# Patient Record
Sex: Female | Born: 1935 | Race: Black or African American | Hispanic: No | Smoking: Never smoker
Health system: Southern US, Community
[De-identification: ages and names within clinical notes are randomized; demographics above are authoritative.]

## PROBLEM LIST (undated history)

## (undated) DIAGNOSIS — I482 Chronic atrial fibrillation, unspecified: Secondary | ICD-10-CM

## (undated) DIAGNOSIS — I5189 Other ill-defined heart diseases: Secondary | ICD-10-CM

## (undated) DIAGNOSIS — A0472 Enterocolitis due to Clostridium difficile, not specified as recurrent: Secondary | ICD-10-CM

## (undated) DIAGNOSIS — I10 Essential (primary) hypertension: Secondary | ICD-10-CM

## (undated) DIAGNOSIS — M109 Gout, unspecified: Secondary | ICD-10-CM

## (undated) DIAGNOSIS — K219 Gastro-esophageal reflux disease without esophagitis: Secondary | ICD-10-CM

## (undated) DIAGNOSIS — B029 Zoster without complications: Secondary | ICD-10-CM

## (undated) DIAGNOSIS — I2510 Atherosclerotic heart disease of native coronary artery without angina pectoris: Secondary | ICD-10-CM

## (undated) DIAGNOSIS — D259 Leiomyoma of uterus, unspecified: Secondary | ICD-10-CM

## (undated) DIAGNOSIS — N182 Chronic kidney disease, stage 2 (mild): Secondary | ICD-10-CM

## (undated) DIAGNOSIS — N289 Disorder of kidney and ureter, unspecified: Secondary | ICD-10-CM

## (undated) DIAGNOSIS — E039 Hypothyroidism, unspecified: Secondary | ICD-10-CM

## (undated) DIAGNOSIS — K5792 Diverticulitis of intestine, part unspecified, without perforation or abscess without bleeding: Secondary | ICD-10-CM

## (undated) DIAGNOSIS — K449 Diaphragmatic hernia without obstruction or gangrene: Secondary | ICD-10-CM

## (undated) DIAGNOSIS — I701 Atherosclerosis of renal artery: Secondary | ICD-10-CM

## (undated) HISTORY — PX: CARDIAC CATHETERIZATION: SHX172

## (undated) HISTORY — PX: RENAL ARTERY STENT: SHX2321

---

## 1999-11-13 ENCOUNTER — Encounter: Payer: Self-pay | Admitting: Cardiology

## 1999-11-13 ENCOUNTER — Ambulatory Visit (HOSPITAL_COMMUNITY): Admission: RE | Admit: 1999-11-13 | Discharge: 1999-11-13 | Payer: Self-pay | Admitting: Cardiology

## 1999-12-26 ENCOUNTER — Encounter: Payer: Self-pay | Admitting: Pulmonary Disease

## 1999-12-26 ENCOUNTER — Inpatient Hospital Stay (HOSPITAL_COMMUNITY): Admission: EM | Admit: 1999-12-26 | Discharge: 1999-12-31 | Payer: Self-pay | Admitting: Pulmonary Disease

## 1999-12-27 ENCOUNTER — Encounter: Payer: Self-pay | Admitting: Pulmonary Disease

## 2000-01-13 ENCOUNTER — Encounter: Payer: Self-pay | Admitting: *Deleted

## 2000-01-13 ENCOUNTER — Ambulatory Visit (HOSPITAL_COMMUNITY): Admission: RE | Admit: 2000-01-13 | Discharge: 2000-01-13 | Payer: Self-pay | Admitting: *Deleted

## 2001-10-18 ENCOUNTER — Encounter: Payer: Self-pay | Admitting: Ophthalmology

## 2001-10-18 ENCOUNTER — Ambulatory Visit (HOSPITAL_COMMUNITY): Admission: RE | Admit: 2001-10-18 | Discharge: 2001-10-18 | Payer: Self-pay | Admitting: Ophthalmology

## 2003-06-14 ENCOUNTER — Inpatient Hospital Stay (HOSPITAL_COMMUNITY): Admission: EM | Admit: 2003-06-14 | Discharge: 2003-06-18 | Payer: Self-pay | Admitting: Emergency Medicine

## 2003-06-14 ENCOUNTER — Encounter: Payer: Self-pay | Admitting: Emergency Medicine

## 2003-06-15 ENCOUNTER — Encounter: Payer: Self-pay | Admitting: Pulmonary Disease

## 2003-06-16 ENCOUNTER — Encounter (INDEPENDENT_AMBULATORY_CARE_PROVIDER_SITE_OTHER): Payer: Self-pay | Admitting: *Deleted

## 2003-11-02 ENCOUNTER — Emergency Department (HOSPITAL_COMMUNITY): Admission: EM | Admit: 2003-11-02 | Discharge: 2003-11-02 | Payer: Self-pay | Admitting: Emergency Medicine

## 2004-02-12 ENCOUNTER — Encounter: Admission: RE | Admit: 2004-02-12 | Discharge: 2004-02-12 | Payer: Self-pay | Admitting: Pulmonary Disease

## 2004-03-17 ENCOUNTER — Emergency Department (HOSPITAL_COMMUNITY): Admission: EM | Admit: 2004-03-17 | Discharge: 2004-03-17 | Payer: Self-pay | Admitting: Emergency Medicine

## 2004-03-17 ENCOUNTER — Inpatient Hospital Stay (HOSPITAL_COMMUNITY): Admission: AD | Admit: 2004-03-17 | Discharge: 2004-03-22 | Payer: Self-pay | Admitting: Pulmonary Disease

## 2004-10-22 ENCOUNTER — Ambulatory Visit (HOSPITAL_COMMUNITY): Admission: RE | Admit: 2004-10-22 | Discharge: 2004-10-22 | Payer: Self-pay | Admitting: Pulmonary Disease

## 2004-11-26 ENCOUNTER — Ambulatory Visit (HOSPITAL_COMMUNITY): Admission: RE | Admit: 2004-11-26 | Discharge: 2004-11-26 | Payer: Self-pay | Admitting: Pulmonary Disease

## 2005-09-05 ENCOUNTER — Emergency Department (HOSPITAL_COMMUNITY): Admission: EM | Admit: 2005-09-05 | Discharge: 2005-09-06 | Payer: Self-pay | Admitting: Emergency Medicine

## 2006-11-23 ENCOUNTER — Emergency Department (HOSPITAL_COMMUNITY): Admission: EM | Admit: 2006-11-23 | Discharge: 2006-11-23 | Payer: Self-pay | Admitting: Emergency Medicine

## 2006-12-11 ENCOUNTER — Ambulatory Visit (HOSPITAL_COMMUNITY): Admission: RE | Admit: 2006-12-11 | Discharge: 2006-12-11 | Payer: Self-pay | Admitting: Pulmonary Disease

## 2006-12-11 ENCOUNTER — Ambulatory Visit: Payer: Self-pay | Admitting: Vascular Surgery

## 2006-12-11 ENCOUNTER — Encounter: Payer: Self-pay | Admitting: Vascular Surgery

## 2009-04-18 ENCOUNTER — Ambulatory Visit (HOSPITAL_COMMUNITY): Admission: RE | Admit: 2009-04-18 | Discharge: 2009-04-18 | Payer: Self-pay | Admitting: Pulmonary Disease

## 2009-10-29 ENCOUNTER — Ambulatory Visit (HOSPITAL_COMMUNITY): Admission: RE | Admit: 2009-10-29 | Discharge: 2009-10-29 | Payer: Self-pay | Admitting: Pulmonary Disease

## 2009-12-06 ENCOUNTER — Ambulatory Visit (HOSPITAL_COMMUNITY): Admission: RE | Admit: 2009-12-06 | Discharge: 2009-12-06 | Payer: Self-pay | Admitting: Pulmonary Disease

## 2009-12-18 ENCOUNTER — Inpatient Hospital Stay (HOSPITAL_BASED_OUTPATIENT_CLINIC_OR_DEPARTMENT_OTHER): Admission: RE | Admit: 2009-12-18 | Discharge: 2009-12-18 | Payer: Self-pay | Admitting: Cardiology

## 2010-08-12 ENCOUNTER — Emergency Department (HOSPITAL_COMMUNITY)
Admission: EM | Admit: 2010-08-12 | Discharge: 2010-08-12 | Payer: Self-pay | Source: Home / Self Care | Admitting: Emergency Medicine

## 2010-12-01 LAB — CBC
HCT: 39.9 % (ref 36.0–46.0)
MCV: 92 fL (ref 78.0–100.0)
Platelets: 203 10*3/uL (ref 150–400)
RDW: 14 % (ref 11.5–15.5)
WBC: 11 10*3/uL — ABNORMAL HIGH (ref 4.0–10.5)

## 2010-12-01 LAB — BASIC METABOLIC PANEL
BUN: 17 mg/dL (ref 6–23)
CO2: 24 mEq/L (ref 19–32)
Chloride: 102 mEq/L (ref 96–112)
Glucose, Bld: 113 mg/dL — ABNORMAL HIGH (ref 70–99)
Potassium: 3.3 mEq/L — ABNORMAL LOW (ref 3.5–5.1)

## 2010-12-01 LAB — GLUCOSE, CAPILLARY: Glucose-Capillary: 254 mg/dL — ABNORMAL HIGH (ref 70–99)

## 2011-01-24 NOTE — Discharge Summary (Signed)
NAMECHEROKEE, BOCCIO NO.:  [id number]   MEDICAL RECORD NO.:  [id number]          PATIENT TYPE:  INP   LOCATION:  2020                         FACILITY:  [facility]   PHYSICIAN:  Eino Farber., M.D.DATE OF BIRTH:  03/01/1932   DATE OF ADMISSION:  03/17/2004  DATE OF DISCHARGE:  03/22/2004                                 DISCHARGE SUMMARY   DISCHARGE DIAGNOSES:  1.  Hypovolemia with dehydration.  2.  Rhabdomyolysis probably due to dehydration and excessive heat.  3.  Diabetes mellitus, type 2, uncontrolled, improved.  4.  Cardiac dysrhythmia with supraventricular bradycardia converted to sinus      rhythm.  5.  Azotemia, mild.  6.  Hypertensive heart disease history.  7.  Hypercholesterolism on Lipitor, question contributing to muscle injury.  8.  Gastrointestinal reflux disease.  9.  Atherosclerotic heart disease history with episodes of angina, mild.   BRIEF HISTORY:  The patient is a 77 year old black female with a history of  multiple medical problems with a long history of hypertensive heart disease,  but developed hypotension and was found at the [facility]  Emergency Department to have supraventricular bradycardia and was admitted  for further care.  The patient also has a history of atherosclerotic heart  disease and was found following a syncopal episode on March 17, 2004, while  being evaluated in the emergency department she was found to be hypotensive.  The patient also has a history of recent onset of diabetes mellitus, type 2.  The syncopal episode is thought to be related to the supraventricular  bradyarrhythmia.  The family relates that she had kept her house severely  hot with her air conditioning being turned of this summer.  The patient was  found on the floor at home and notified a relative, who brought her to the  emergency department by emergency medical services.  The patient also has a  past history of  gastrointestinal reflux disease.  She has a several-year  history of mild to moderate Alzheimer's.   SOCIAL HISTORY:  The patient denied a history of smoking or alcohol abuse.   REVIEW OF SYSTEMS:  The head, eyes, ears, nose, and throat, cardiopulmonary,  GI and neuromuscular systems as noted.  She has a long history of  hypercholesterolism and also had developed an acute episode of coughing  which continued in the emergency department.  The symptoms of cough  continued during her period of evaluation on admission.   ALLERGIES:  The patient has no known drug allergies.   PERTINENT PHYSICAL FINDINGS:  GENERAL APPEARANCE:  The patient is a well-  developed, well-nourished, black female who was oriented x 3 at the time of  my examination initially, as well as on March 18, 2004, but was unable to  give details about her condition.  VITAL SIGNS:  Vital signs in the emergency department revealed a blood  pressure of 83/51 and a pulse of 42, but after IV fluids and admission to  the floor on the 2000 unit with telemetry, her temperature was 96.6 degrees,  pulse 48, respirations  24 and blood pressure up to 98/56.  HEENT:  Examination revealed the patient to be alert and oriented x 3, but  slow to respond to questions.  NECK:  Supple with no masses.  There was no evidence of thyroid enlargement.  BREASTS:  No masses.  LUNGS:  Clear to auscultation and percussion, except for minimal rhonchi  with cough.  EXTREMITIES:  Good strength with movement bilaterally.  The dorsalis pedis  pulses were present, but weak.   LABORATORY DATA:  The patient's cardiac enzymes revealed a CK, CK-MB and  troponin I to be elevated, but the CK was markedly elevated more pronounced  consistent with musculoskeletal injury with the CK elevated to 4347.  The  renal function revealed mild increase in the BUN to 37 mg% with a serum  creatinine of 2.5 mg%.   HOSPITAL COURSE:  The patient's hospital course was one of  gradual  improvement.  Her CK slowly improved during her hospital course and on March 19, 2004, the CK was improved, but still markedly elevated to 3319.  By March 21, 2004, the CK had continued to improve with a value of 825 units/L with  the CK-MB in the normal range.  The patient had adjustments in her diabetic  therapy in that she had an episode of hypoglycemia with a capillary blood  glucose of 50 mg% on March 20, 2004.   DISCHARGE MEDICATIONS:  1.  Actos 15 mg tablet with dinner meal.  2.  Norvasc 5 mg tablet one daily.  3.  Accupril/hydrochlorothiazide 20/12.5 mg tablet daily.  4.  Toprol XL 25 mg tablet one daily in that her blood pressure on March 21, 2004, increased to 136/74 and on March 22, 2004, her blood pressure had      increased to 161/95, requiring the restart of her antihypertensive      medication.  5.  Restart Lipitor 20 mg tablet one daily.  6.  Calcium citrate 600 mg tablet one b.i.d.  7.  K-Dur 20 mEq tablet one daily.  8.  Protonix 40 mg tablet one daily.  9.  Chronulac 30 mL q.h.s. for stool softener.  10. Estazolam (ProSom) 2 mg at h.s. p.r.n. sleep.  11. Nortriptyline 50 mg tablet one q.h.s. for rest and pain.  12. Darvocet-N 100 one q.6h. p.r.n. pain.   ACTIVITY:  The patient was instructed to ambulate as tolerated.   DISCHARGE DIET:  No concentrated sweets, low-fat and low-cholesterol  diabetic diet.   Her lipid profile was satisfactory on her statin therapy with a total  cholesterol of 142 mg%, HDL cholesterol 48 mg% and an LDL cholesterol of 77  mg%.   FOLLOWUP:  The patient was to have a followup in the office on April 16, 2004.   SPECIAL INSTRUCTIONS:  She was to avoid extreme heat with her temperature in  the home not being allowed to go over 85 degrees and to use her air  conditioning.       GRK/MEDQ  D:  06/17/2004  T:  06/17/2004  Job:  [id number]

## 2011-01-24 NOTE — Discharge Summary (Signed)
[name], [name] NO.:  [id number]   MEDICAL RECORD NO.:  [id number]                   PATIENT TYPE:  INP   LOCATION:  4733                                 FACILITY:  [facility]   PHYSICIAN:  Mina Marble, M.D.          DATE OF BIRTH:  03/01/1932   DATE OF ADMISSION:  06/14/2003  DATE OF DISCHARGE:  06/18/2003                                 DISCHARGE SUMMARY   PROCEDURES:  On June 16, 2003, colonoscopy.   ADMISSION DIAGNOSES:  1. Acute abdominal distention, rule out partial colon obstruction.  2. Urinary tract infection.  3. Hypertensive heart disease.  4. Type 2 diabetes mellitus, new onset.  5. Degenerative joint disease of extremities, moderate.  6. Gastrointestinal reflux disease clinically.  7. History of hypercholesterolemia.   DISCHARGE DIAGNOSES:  1. Acute abdominal distention, improved.  2. Acute diverticulitis, biopsy pending.  3. Type 2 diabetes mellitus, new onset.  4. Urinary tract infection, Escherichia coli.  5. Hypercholesterolemia.  6. Hypertensive heart disease.  7. Degenerative joint disease of lower extremities.  8. Gastrointestinal reflux disease.  9. Atherosclerotic heart disease with angina, controlled.  10.      Constipation, improved.   REASON FOR HOSPITALIZATION:  The patient is a 77 year old black female with  known history of hypertensive heart disease and gastrointestinal reflux  disease clinically, who was admitted from the ED of [facility] with complaints of acute onset of low mid abdominal pain with  abdominal distention with a large amount of gas and stool in colon of  unclear etiology and question of colon lesions with partial obstruction.  She was admitted for further evaluation and treatment.   ALLERGIES:  No known drug allergies.   LABORATORY STUDIES:  On June 14, 2003, EKG showed tachycardia and possible  anterolateral infarction.  CT of the abdomen and pelvis on  June 14, 2003,  showed a large amount of fecal material within the left colon and splenic  flexure, gaseous distention of transverse and right colon, no definite  obstructing mass seen, and no evidence of diverticulosis or diverticulitis  identified.  CT of the pelvis showed a small amount of free fluid in the  pelvis, uterine fibroids, and no definite colon masses identified, but there  was decompression of the rectum and lower sigmoid colon with distention of  the upper sigmoid colon, left colon, transverse colon, and right colon.  On  June 14, 2003, the CBC showed WBC 17.4, hemoglobin 13, hematocrit 38.5,  and platelets 253.  On June 18, 2003, the WBC was 9.1, hemoglobin 10.7,  hematocrit 32.1, and platelets 240.  On June 14, 2003, chemistries were  within normal limits, except for a glucose of 171, calcium 8.2, albumin 2.7,  and total bilirubin 0.2.  The hemoglobin A1C on June 15, 2003, was 7.9.  Cardiac enzymes:  The CK was 83, the CK-MB was 1.6, and the troponin I  was  0.02.  On June 15, 2003, the lipid profile showed cholesterol 144,  triglycerides 122, HDL 41, and LDL 79.  On June 14, 2003, the urine  culture showed Escherichia coli.   HOSPITAL COURSE:  The patient was admitted to a telemetry bed.  IV therapy  was initiated.  The patient was started on Zosyn 3.75 g IV q.8h.  Vicodin  one q.4h. was given p.r.n. for pain.  The patient was started on a clear  liquid diet.  Also added to the plan of care was Lactulose 30 mL q.a.m. and  h.s. and Protonix 40 mg p.o. daily.  Because the patient was a new diabetic,  the patient was started on 1/2 normal saline with 20 mEq of K at 100 mL/hr  and started on an insulin-sensitive scale.  A KUB was performed.  Also, a  gastroenterology consult was done by Georgiana Spinner, M.D.  He performed a  colonoscopy on June 16, 2003.  It showed a questionable diverticulitis.  He also started the patient on MiraLax.  Dr. [name] Rochester thought that it  was  probably diverticulitis.  For better control of the patient's diabetes, she  was started on Actos 15 mg tablet daily with dinner meal and Amaryl 2 mg  tablet a.c. breakfast.  He also added to her care with Toprol XL 25 mg  daily.  The patient was changed to a low-residue diet.  Also for her  hypertension, Norvasc 5 mg was changed to 15 mg p.o. daily.  The patient was  given flu vaccination before discharge.   DISCHARGE CONDITION:  The patient was discharged home on stable condition  with the patient feeling better with less abdominal pain and able to  tolerate p.o. intake.  Her temperature was 98 degrees.   DISCHARGE MEDICATIONS:  1. Augmentin 500 mg tablet one b.i.d. for five more days.  2. Lactulose 30 mL q.a.m. and q.h.s.  3. MiraLax __________  4. Tiazac 40 mg tablet one daily.  5. Zestoretic 20/12.5 mg tablet one daily.  6. Toprol XL 25 mg tablet one daily.  7. Actos 15 mg tablet one daily with dinner meal.  8. Amaryl 1 mg tablet one daily a.c. breakfast.  9. Protonix 40 mg tablet one daily.  10.      Trental 40 mg tablet one daily.  11.      Continue with Lipitor 20 mg tablet one daily as ordered.   ACTIVITY:  Ambulate as tolerated.   DIET:  200 calorie ADA diet, low-fat, low-cholesterol, no added salt diet.    SPECIAL INSTRUCTIONS:  Diabetic education at [facility].  Check  with the office for samples if needed.   FOLLOWUP:  Call the office for followup time.      Dionne D. Retta Mac, M.D.    DDR/MEDQ  D:  07/09/2003  T:  07/09/2003  Job:  [id number]   cc:   Georgiana Spinner, M.D.  [address]  [place]  [place] [address]  Fax: [phone]

## 2011-01-24 NOTE — Op Note (Signed)
   NAME:  [name] [name], [name] [name] NO.:  [id number]   MEDICAL RECORD NO.:  [id number]                   PATIENT TYPE:  INP   LOCATION:  4733                                 FACILITY:  [facility]   PHYSICIAN:  Georgiana Spinner, M.D.                 DATE OF BIRTH:  03/01/1932   DATE OF PROCEDURE:  06/16/2003  DATE OF DISCHARGE:                                 OPERATIVE REPORT   PROCEDURE:  Colonoscopy with biopsy.   INDICATIONS:  Abdominal pain, increasing constipation.   ANESTHESIA:  Demerol 140 mg, Versed 14 mg.   DESCRIPTION OF PROCEDURE:  With the patient mildly sedated in the left  lateral decubitus position, subsequently rolled to her back and also onto  her right side, the Olympus videoscopic colonoscope was inserted into the  rectum and passed under direct vision with pressure applied, the patient  turned to various positions.  We subsequently were able to get through a  very spastic colon and we finally reached the cecum, identified by ileocecal  valve and crow's foot of the cecum, which were photographed.  From this  point the colonoscope was slowly withdrawn, taking circumferential views of  colonic mucosa, stopping in what appeared to be the descending-sigmoid colon  area, at which point ulceration was seen.  We never saw diverticulosis per  se but the mucosa was somewhat erythematous.  I saw no mass lesion, but  there was a very large ulcer that was photographed and biopsied.  The  surrounding mucosa was somewhat erythematous and just circumferential for a  short distance around the ulcer, the mucosa was also somewhat erythematous  but smooth.  After biopsies were taken, the endoscope was then withdrawn all  the way to the rectum, which appeared normal on direct and on retroflexed  view was difficult to see because the patient was not able to hold her  insufflated air throughout the procedure, but no gross lesions seen.  The  endoscope was straightened and  withdrawn.  The patient's vital signs and  pulse oximetry remained stable.  The patient tolerated the procedure well  without apparent complications.   FINDINGS:  Changes in sigmoid colon/descending colon as noted above.  Presumptively this is diverticulitis, but we will await biopsy report.  Will  continue the patient on antibiotics.                                               Georgiana Spinner, M.D.    GMO/MEDQ  D:  06/16/2003  T:  06/17/2003  Job:  [id number]   cc:   Eino Farber., M.D.  [address] [place]  [place] [address]  Fax: [phone]

## 2011-01-24 NOTE — Consult Note (Signed)
NAME:  MOLLEY, HOUSER NO.:  [id number]   MEDICAL RECORD NO.:  [id number]                   PATIENT TYPE:  INP   LOCATION:  4733                                 FACILITY:  [facility]   PHYSICIAN:  Georgiana Spinner, M.D.                 DATE OF BIRTH:  03/01/1932   DATE OF CONSULTATION:  06/15/2003  DATE OF DISCHARGE:                                   CONSULTATION   Ms. Wass is a very lovely 77 year old lady who I have been asked to see  because of abdominal discomfort and colonic distention.  She states that she  has had problems with constipation for the last couple of years, but that  had always been responsive to lactulose; but, over the past few weeks she  has noticed less responsiveness to the lactulose and more constipation, to  the point where she states that the lactulose will not work to cause a bowel  movement and she has noticed some abdominal discomfort and distention.  No  fever or chills.  She was admitted by Dr. Petra Kuba and a CT scan showed  stool in the left colon and transverse colon with a possible obstruction of  the distal colon, but no evidence of diverticulosis was noted.  Certainly no  diverticulitis.  The patient denies any melena or hematochezia but was noted  to have a white count of 17,000 with a hematocrit of 38.5 on admission.  The  patient does not smoke or drink.  She had been taking care of children in  her home after leaving [employer], but currently she is retired.   FAMILY HISTORY:  Family history is negative for colon polyps and colon  cancer.  She has had significant reflux.  She also has had a history of  hypertension, hypercholesterolemia, diabetes type 2 and degenerative  arthritis.   PHYSICAL EXAMINATION:  GENERAL:  On examination now she is a pleasant  female. She appears to be alert, oriented and responsive.  HEENT:  Unremarkable at this time.  NECK:  Thyroid is not enlarged.  No bruits are heard  in the neck.  CHEST:  Lungs are clear.  CARDIOVASCULAR:  Heart is regular rhythm without murmur, gallop, or rub.  ABDOMEN:  Benign except for tenderness in the left lower quadrant.   IMPRESSION:  Certainly it seems that she could have diverticulitis with an  elevated white count, tenderness in the left lower quadrant with abdominal  pain in the left lower quadrant and change in bowel habits, but given the  fact that her CT scan showed no changes of diverticulitis and even no  diverticula were seen, it certainly gives concern for possible malignancy in  the sigmoid colon area. Currently her temperature is 98.3, blood pressure is  154/95,  respiratory rate 20 with a 93% saturation on room air. Therefore, I think,  that the next step would be  to do colonoscopy.  We explained that procedure  to the patient, the risks, benefits, and the rationale and she is going to  proceed.                                               Georgiana Spinner, M.D.    GMO/MEDQ  D:  06/15/2003  T:  06/16/2003  Job:  [id number]   cc:   Eino Farber., M.D.  [address] [place]  [place] [address]  Fax: [phone]

## 2012-01-19 ENCOUNTER — Inpatient Hospital Stay (HOSPITAL_COMMUNITY)
Admission: EM | Admit: 2012-01-19 | Discharge: 2012-01-26 | DRG: 871 | Disposition: A | Discharge status: Home Health | Payer: Medicare Other | Attending: Internal Medicine | Admitting: Internal Medicine

## 2012-01-19 ENCOUNTER — Emergency Department (HOSPITAL_COMMUNITY): Payer: Medicare Other

## 2012-01-19 ENCOUNTER — Encounter (HOSPITAL_COMMUNITY): Payer: Self-pay | Admitting: Emergency Medicine

## 2012-01-19 DIAGNOSIS — R109 Unspecified abdominal pain: Secondary | ICD-10-CM

## 2012-01-19 DIAGNOSIS — R6510 Systemic inflammatory response syndrome (SIRS) of non-infectious origin without acute organ dysfunction: Secondary | ICD-10-CM

## 2012-01-19 DIAGNOSIS — K659 Peritonitis, unspecified: Secondary | ICD-10-CM | POA: Diagnosis present

## 2012-01-19 DIAGNOSIS — K560 Paralytic ileus: Secondary | ICD-10-CM | POA: Diagnosis present

## 2012-01-19 DIAGNOSIS — A0472 Enterocolitis due to Clostridium difficile, not specified as recurrent: Secondary | ICD-10-CM | POA: Diagnosis present

## 2012-01-19 DIAGNOSIS — A419 Sepsis, unspecified organism: Principal | ICD-10-CM | POA: Diagnosis present

## 2012-01-19 DIAGNOSIS — I10 Essential (primary) hypertension: Secondary | ICD-10-CM | POA: Diagnosis present

## 2012-01-19 DIAGNOSIS — N390 Urinary tract infection, site not specified: Secondary | ICD-10-CM | POA: Diagnosis present

## 2012-01-19 DIAGNOSIS — E119 Type 2 diabetes mellitus without complications: Secondary | ICD-10-CM | POA: Diagnosis present

## 2012-01-19 DIAGNOSIS — N179 Acute kidney failure, unspecified: Secondary | ICD-10-CM | POA: Diagnosis present

## 2012-01-19 DIAGNOSIS — E039 Hypothyroidism, unspecified: Secondary | ICD-10-CM | POA: Diagnosis present

## 2012-01-19 DIAGNOSIS — E872 Acidosis, unspecified: Secondary | ICD-10-CM | POA: Diagnosis present

## 2012-01-19 DIAGNOSIS — E876 Hypokalemia: Secondary | ICD-10-CM | POA: Diagnosis present

## 2012-01-19 DIAGNOSIS — D72829 Elevated white blood cell count, unspecified: Secondary | ICD-10-CM

## 2012-01-19 DIAGNOSIS — E861 Hypovolemia: Secondary | ICD-10-CM | POA: Diagnosis present

## 2012-01-19 DIAGNOSIS — K5900 Constipation, unspecified: Secondary | ICD-10-CM

## 2012-01-19 DIAGNOSIS — K567 Ileus, unspecified: Secondary | ICD-10-CM | POA: Diagnosis present

## 2012-01-19 DIAGNOSIS — J9600 Acute respiratory failure, unspecified whether with hypoxia or hypercapnia: Secondary | ICD-10-CM | POA: Diagnosis present

## 2012-01-19 DIAGNOSIS — B952 Enterococcus as the cause of diseases classified elsewhere: Secondary | ICD-10-CM | POA: Diagnosis present

## 2012-01-19 DIAGNOSIS — E7800 Pure hypercholesterolemia, unspecified: Secondary | ICD-10-CM | POA: Diagnosis present

## 2012-01-19 DIAGNOSIS — J189 Pneumonia, unspecified organism: Secondary | ICD-10-CM | POA: Diagnosis present

## 2012-01-19 DIAGNOSIS — J9601 Acute respiratory failure with hypoxia: Secondary | ICD-10-CM | POA: Diagnosis present

## 2012-01-19 DIAGNOSIS — E860 Dehydration: Secondary | ICD-10-CM | POA: Diagnosis present

## 2012-01-19 HISTORY — DX: Essential (primary) hypertension: I10

## 2012-01-19 LAB — URINALYSIS, ROUTINE W REFLEX MICROSCOPIC
Glucose, UA: 250 mg/dL — AB
Leukocytes, UA: NEGATIVE
Nitrite: NEGATIVE

## 2012-01-19 LAB — POCT I-STAT TROPONIN I: Troponin i, poc: 0.27 ng/mL (ref 0.00–0.08)

## 2012-01-19 LAB — COMPREHENSIVE METABOLIC PANEL
AST: 24 U/L (ref 0–37)
Albumin: 3.8 g/dL (ref 3.5–5.2)
BUN: 30 mg/dL — ABNORMAL HIGH (ref 6–23)
CO2: 21 mEq/L (ref 19–32)
Calcium: 10.2 mg/dL (ref 8.4–10.5)
Creatinine, Ser: 1.73 mg/dL — ABNORMAL HIGH (ref 0.50–1.10)
GFR calc non Af Amer: 27 mL/min — ABNORMAL LOW (ref >90)

## 2012-01-19 LAB — CBC
MCH: 29.9 pg (ref 26.0–34.0)
MCHC: 32.9 g/dL (ref 30.0–36.0)
Platelets: 242 10*3/uL (ref 150–400)

## 2012-01-19 LAB — TROPONIN I: Troponin I: 0.31 ng/mL (ref <0.30)

## 2012-01-19 LAB — DIFFERENTIAL
Eosinophils Absolute: 0 10*3/uL (ref 0.0–0.7)
Monocytes Absolute: 1 10*3/uL (ref 0.1–1.0)
Neutrophils Relative %: 93 % — ABNORMAL HIGH (ref 43–77)

## 2012-01-19 LAB — URINE MICROSCOPIC-ADD ON

## 2012-01-19 LAB — LACTIC ACID, PLASMA: Lactic Acid, Venous: 4.6 mmol/L — ABNORMAL HIGH (ref 0.5–2.2)

## 2012-01-19 LAB — LIPASE, BLOOD: Lipase: 30 U/L (ref 11–59)

## 2012-01-19 MED ORDER — MORPHINE SULFATE 4 MG/ML IJ SOLN
4.0000 mg | Freq: Once | INTRAMUSCULAR | Status: AC
  Administered 2012-01-19: 4 mg via INTRAVENOUS
  Filled 2012-01-19: qty 1

## 2012-01-19 MED ORDER — SODIUM CHLORIDE 0.9 % IV BOLUS (SEPSIS)
500.0000 mL | Freq: Once | INTRAVENOUS | Status: AC
  Administered 2012-01-19: 21:00:00 via INTRAVENOUS

## 2012-01-19 MED ORDER — SODIUM CHLORIDE 0.9 % IV BOLUS (SEPSIS)
500.0000 mL | Freq: Once | INTRAVENOUS | Status: AC
  Administered 2012-01-19: 20:00:00 via INTRAVENOUS

## 2012-01-19 MED ORDER — SODIUM CHLORIDE 0.9 % IV SOLN: INTRAVENOUS | Status: DC

## 2012-01-19 MED ORDER — SODIUM CHLORIDE 0.9 % IV BOLUS (SEPSIS)
500.0000 mL | Freq: Once | INTRAVENOUS | Status: AC
  Administered 2012-01-19: 500 mL via INTRAVENOUS

## 2012-01-19 MED ORDER — PIPERACILLIN-TAZOBACTAM 3.375 G IVPB
3.3750 g | Freq: Once | INTRAVENOUS | Status: AC
  Administered 2012-01-19: 3.375 g via INTRAVENOUS
  Filled 2012-01-19: qty 50

## 2012-01-19 MED ORDER — ONDANSETRON HCL 4 MG/2ML IJ SOLN
4.0000 mg | Freq: Once | INTRAMUSCULAR | Status: AC
  Administered 2012-01-19: 4 mg via INTRAVENOUS
  Filled 2012-01-19: qty 2

## 2012-01-19 MED ORDER — SODIUM CHLORIDE 0.9 % IV BOLUS (SEPSIS)
500.0000 mL | Freq: Once | INTRAVENOUS | Status: AC
  Administered 2012-01-19: 16:00:00 via INTRAVENOUS

## 2012-01-19 MED ORDER — IOHEXOL 300 MG/ML  SOLN
100.0000 mL | Freq: Once | INTRAMUSCULAR | Status: AC | PRN
  Administered 2012-01-19: 100 mL via INTRAVENOUS

## 2012-01-19 NOTE — ED Notes (Signed)
Helped pt on to the bedside commode; pt unable to void

## 2012-01-19 NOTE — ED Notes (Signed)
Pt c/o rt chest pain and abd pain since 1am this morning. Pt states she has not had a bm in 2 days. Pt states her chest feels better when she rubs the area.

## 2012-01-19 NOTE — ED Provider Notes (Signed)
History   This chart was scribed for Joya Gaskins, MD by Brooks Sailors. The patient was seen in room APA01/APA01. Patient's care was started at 1207.   CSN: [id number]  Arrival date & time 01/19/12  1207   First MD Initiated Contact with Patient 01/19/12 1302      Chief Complaint  Patient presents with  . Chest Pain  . Abdominal Pain     HPI [name] [name] is a 78 y.o. female who presents to the Emergency Department complaining of constant moderate chest pain and associated abdominal pain,  weakness onset today 12 hours ago. Patient notes she has not had a BM in the past two days and has been taking medication to try and move bowels without relief.  Denies LOC, Cough.  No vomiting Nothing improves symptoms Palpation worsens her symptoms Her course is worsening   Past Medical History  Diagnosis Date  . Diabetes mellitus   . Hypertension     History reviewed. No pertinent past surgical history.  History reviewed. No pertinent family history.  History  Substance Use Topics  . Smoking status: Never Smoker   . Smokeless tobacco: Not on file  . Alcohol Use: No    OB History    Grav Para Term Preterm Abortions TAB SAB Ect Mult Living                  Review of Systems  All other systems reviewed and are negative.    Allergies  Review of patient's allergies indicates no known allergies.  Home Medications   Current Outpatient Rx  Name Route Sig Dispense Refill  . ADULT MULTIVITAMIN W/MINERALS CH Oral Take 1 tablet by mouth at bedtime.    Marland Kitchen NAPROXEN SODIUM 220 MG PO TABS Oral Take 220 mg by mouth 2 (two) times daily as needed. Back Pain      BP 129/69  Pulse 109  Temp(Src) 97.6 F (36.4 C) (Oral)  Resp 22  Ht 5\' 6"  (1.676 m)  SpO2 91%  Physical Exam CONSTITUTIONAL: Well developed/well nourished HEAD AND FACE: Normocephalic/atraumatic EYES: EOMI/PERRL ENMT: Mucous membranes moist NECK: supple no meningeal signs SPINE:entire spine nontender CV:  S1/S2 noted, no murmurs/rubs/gallops noted LUNGS: Lungs are clear to auscultation bilaterally, no apparent distress ABDOMEN:  +distended, decreased bowl sounds, Diffusely tender, no rebound or guarding GU:no cva tenderness NEURO: Pt is awake/alert, moves all extremitiesx4 EXTREMITIES: pulses normal, full ROM SKIN: warm, color normal PSYCH: no abnormalities of mood noted  ED Course  Procedures   CRITICAL CARE Performed by: Joya Gaskins   Total critical care time: 45  Critical care time was exclusive of separately billable procedures and treating other patients.  Critical care was necessary to treat or prevent imminent or life-threatening deterioration.  Critical care was time spent personally by me on the following activities: development of treatment plan with patient and/or surrogate as well as nursing, discussions with consultants, evaluation of patient's response to treatment, examination of patient, obtaining history from patient or surrogate, ordering and performing treatments and interventions, ordering and review of laboratory studies, ordering and review of radiographic studies, pulse oximetry and re-evaluation of patient's condition.   13:25 Patient informed of current plan for treatment and evaluation and agrees with plan at this time.  15:26 Patient to have CT scan of the abdomen.  Pt poor historian, appears to have distended abdomen and this seems to be source of pain.  Elevated troponin, initial EKGs show artifact but no acute STEMI.  I  feel this is likely primarily abdominal process Repeat EKG at 1533 shows no acute STEMI/change 4:08 PM Lactate elevated, will have CT imaging soon, IV fluids started 5:52 PM Spoke to dr Lovell Sheehan, surgery Will see patient in the ER due to concerning labs/abdominal exam 6:23 PM D/w dr Lovell Sheehan, he requests I transfer patient to [place] due to elevated lactate and elevated troponin Pt agreeable with plan IV fluids already given,  will order antibiotics 6:32 PM D/w dr Monica Becton, he feels she can go to stepdown under medicine Continue IV fluids 7:01 PM D/w dr Lovell Sheehan, he reviewed CT imaging, no acute surgical intervention required D/w dr Laural Benes at [facility], will accept to stepdown Zosyn ordered IV fluids ordered I suspect +troponin is related to underlying abdominal process  Pt awake/alert, no distress, no hypotension    Labs Reviewed  CBC  DIFFERENTIAL  COMPREHENSIVE METABOLIC PANEL  LIPASE, BLOOD  URINALYSIS, ROUTINE W REFLEX MICROSCOPIC  URINE CULTURE  LACTIC ACID, PLASMA   Dg Abd Acute W/chest  01/19/2012  *RADIOLOGY REPORT*  Clinical Data: Right-sided chest and abdominal pain  ACUTE ABDOMEN SERIES (ABDOMEN 2 VIEW & CHEST 1 VIEW)  Comparison: No similar prior study is available for comparison.Lumbar spine radiographs 08/12/2010 are reviewed.  Findings: Moderate hiatal hernia noted.  Cardiac leads obscure detail.  Curvilinear left mid lung zone atelectasis or scarring noted. Low lung volumes with crowding of the bronchovascular markings.  Patchy right lower lobe airspace opacity is noted particularly at the right cardiophrenic angle.  Small right pleural effusion or thickening noted.  No free air beneath the diaphragms. There are multiple air-fluid levels with diffuse mild apparent colonic distention.  No gas filled dilated loop of small bowel is seen.  Mild lumbar spine degenerative change is again identified.  IMPRESSION: Mild colonic prominence and air fluid levels which may suggest ileus.  Ill-defined right cardiophrenic angle airspace opacity could reflect atelectasis or possibly early pneumonia in the appropriate clinical context.  No dilated gas filled loop of small bowel.  Original Report Authenticated By: Harrel Lemon, M.D.       MDM  Nursing notes reviewed and considered in documentation All labs/vitals reviewed and considered xrays reviewed and considered Previous records reviewed and  considered    Date: 01/19/2012  Rate: 104  Rhythm: sinus tachycardia  QRS Axis: left  Intervals: QT prolonged  ST/T Wave abnormalities: nonspecific ST changes  Conduction Disutrbances:none  Narrative Interpretation:   Old EKG Reviewed: unchanged Poor quality      I personally performed the services described in this documentation, which was scribed in my presence. The recorded information has been reviewed and considered.      Joya Gaskins, MD 01/19/12 Mikle Bosworth

## 2012-01-20 ENCOUNTER — Encounter (HOSPITAL_COMMUNITY): Payer: Self-pay | Admitting: Internal Medicine

## 2012-01-20 ENCOUNTER — Inpatient Hospital Stay (HOSPITAL_COMMUNITY): Payer: Medicare Other

## 2012-01-20 DIAGNOSIS — A419 Sepsis, unspecified organism: Secondary | ICD-10-CM | POA: Diagnosis present

## 2012-01-20 DIAGNOSIS — E872 Acidosis, unspecified: Secondary | ICD-10-CM | POA: Diagnosis present

## 2012-01-20 DIAGNOSIS — R109 Unspecified abdominal pain: Secondary | ICD-10-CM

## 2012-01-20 DIAGNOSIS — E039 Hypothyroidism, unspecified: Secondary | ICD-10-CM | POA: Diagnosis present

## 2012-01-20 DIAGNOSIS — J9601 Acute respiratory failure with hypoxia: Secondary | ICD-10-CM | POA: Diagnosis present

## 2012-01-20 DIAGNOSIS — E876 Hypokalemia: Secondary | ICD-10-CM | POA: Diagnosis present

## 2012-01-20 DIAGNOSIS — R6510 Systemic inflammatory response syndrome (SIRS) of non-infectious origin without acute organ dysfunction: Secondary | ICD-10-CM | POA: Diagnosis present

## 2012-01-20 DIAGNOSIS — K567 Ileus, unspecified: Secondary | ICD-10-CM | POA: Diagnosis present

## 2012-01-20 DIAGNOSIS — E860 Dehydration: Secondary | ICD-10-CM | POA: Diagnosis present

## 2012-01-20 DIAGNOSIS — K5289 Other specified noninfective gastroenteritis and colitis: Secondary | ICD-10-CM

## 2012-01-20 LAB — CBC
Hemoglobin: 11.1 g/dL — ABNORMAL LOW (ref 12.0–15.0)
MCH: 29.6 pg (ref 26.0–34.0)
MCH: 30.2 pg (ref 26.0–34.0)
MCHC: 33 g/dL (ref 30.0–36.0)
MCV: 90 fL (ref 78.0–100.0)
Platelets: 215 10*3/uL (ref 150–400)
RDW: 14.9 % (ref 11.5–15.5)
RDW: 15.1 % (ref 11.5–15.5)
WBC: 25.4 10*3/uL — ABNORMAL HIGH (ref 4.0–10.5)

## 2012-01-20 LAB — BASIC METABOLIC PANEL
BUN: 39 mg/dL — ABNORMAL HIGH (ref 6–23)
CO2: 20 mEq/L (ref 19–32)
Calcium: 7.7 mg/dL — ABNORMAL LOW (ref 8.4–10.5)
Creatinine, Ser: 1.56 mg/dL — ABNORMAL HIGH (ref 0.50–1.10)
Creatinine, Ser: 1.49 mg/dL — ABNORMAL HIGH (ref 0.50–1.10)
GFR calc non Af Amer: 31 mL/min — ABNORMAL LOW (ref >90)
Glucose, Bld: 191 mg/dL — ABNORMAL HIGH (ref 70–99)
Glucose, Bld: 180 mg/dL — ABNORMAL HIGH (ref 70–99)
Potassium: 4.1 mEq/L (ref 3.5–5.1)

## 2012-01-20 LAB — MRSA PCR SCREENING: MRSA by PCR: NEGATIVE

## 2012-01-20 LAB — GLUCOSE, CAPILLARY
Glucose-Capillary: 184 mg/dL — ABNORMAL HIGH (ref 70–99)
Glucose-Capillary: 189 mg/dL — ABNORMAL HIGH (ref 70–99)

## 2012-01-20 LAB — CARDIAC PANEL(CRET KIN+CKTOT+MB+TROPI)
Relative Index: 3.1 — ABNORMAL HIGH (ref 0.0–2.5)
Troponin I: 0.39 ng/mL (ref <0.30)

## 2012-01-20 LAB — BLOOD GAS, ARTERIAL
Acid-base deficit: 7.1 mmol/L — ABNORMAL HIGH (ref 0.0–2.0)
Patient temperature: 97.9
TCO2: 19.4 mmol/L (ref 0–100)

## 2012-01-20 LAB — PROCALCITONIN: Procalcitonin: 0.4 ng/mL

## 2012-01-20 LAB — CREATININE, SERUM: GFR calc Af Amer: 40 mL/min — ABNORMAL LOW (ref >90)

## 2012-01-20 MED ORDER — SIMVASTATIN 20 MG PO TABS
20.0000 mg | ORAL_TABLET | Freq: Every evening | ORAL | Status: DC
  Administered 2012-01-20 – 2012-01-25 (×6): 20 mg via ORAL
  Filled 2012-01-20 (×7): qty 1

## 2012-01-20 MED ORDER — PIPERACILLIN-TAZOBACTAM 3.375 G IVPB 30 MIN
3.3750 g | Freq: Three times a day (TID) | INTRAVENOUS | Status: DC
  Administered 2012-01-20 – 2012-01-21 (×4): 3.375 g via INTRAVENOUS
  Filled 2012-01-20 (×7): qty 50

## 2012-01-20 MED ORDER — ASPIRIN EC 325 MG PO TBEC
325.0000 mg | DELAYED_RELEASE_TABLET | Freq: Every day | ORAL | Status: DC
  Administered 2012-01-21 – 2012-01-26 (×6): 325 mg via ORAL
  Filled 2012-01-20 (×7): qty 1

## 2012-01-20 MED ORDER — ONDANSETRON HCL 4 MG PO TABS: 4.0000 mg | ORAL_TABLET | Freq: Four times a day (QID) | ORAL | Status: DC | PRN

## 2012-01-20 MED ORDER — HEPARIN SODIUM (PORCINE) 5000 UNIT/ML IJ SOLN
5000.0000 [IU] | Freq: Three times a day (TID) | INTRAMUSCULAR | Status: DC
  Administered 2012-01-20 – 2012-01-26 (×18): 5000 [IU] via SUBCUTANEOUS
  Filled 2012-01-20 (×23): qty 1

## 2012-01-20 MED ORDER — KCL IN DEXTROSE-NACL 20-5-0.9 MEQ/L-%-% IV SOLN
INTRAVENOUS | Status: DC
  Administered 2012-01-20 (×2): via INTRAVENOUS
  Administered 2012-01-21: 1000 mL via INTRAVENOUS
  Administered 2012-01-21 – 2012-01-22 (×2): via INTRAVENOUS
  Filled 2012-01-20 (×8): qty 1000

## 2012-01-20 MED ORDER — CYCLOSPORINE 0.05 % OP EMUL
1.0000 [drp] | Freq: Two times a day (BID) | OPHTHALMIC | Status: DC
  Administered 2012-01-20 – 2012-01-26 (×13): 1 [drp] via OPHTHALMIC
  Filled 2012-01-20 (×19): qty 1

## 2012-01-20 MED ORDER — MORPHINE SULFATE 2 MG/ML IJ SOLN
0.5000 mg | INTRAMUSCULAR | Status: AC
  Administered 2012-01-20: 0.5 mg via INTRAVENOUS
  Filled 2012-01-20: qty 1

## 2012-01-20 MED ORDER — METOPROLOL SUCCINATE ER 25 MG PO TB24
25.0000 mg | ORAL_TABLET | Freq: Two times a day (BID) | ORAL | Status: DC
  Administered 2012-01-20 – 2012-01-23 (×7): 25 mg via ORAL
  Filled 2012-01-20 (×9): qty 1

## 2012-01-20 MED ORDER — SODIUM CHLORIDE 0.9 % IJ SOLN
3.0000 mL | Freq: Two times a day (BID) | INTRAMUSCULAR | Status: DC
  Administered 2012-01-20 – 2012-01-25 (×9): 3 mL via INTRAVENOUS

## 2012-01-20 MED ORDER — LEVOTHYROXINE SODIUM 75 MCG PO TABS
75.0000 ug | ORAL_TABLET | Freq: Every day | ORAL | Status: DC
  Administered 2012-01-20 – 2012-01-26 (×7): 75 ug via ORAL
  Filled 2012-01-20 (×8): qty 1

## 2012-01-20 MED ORDER — POTASSIUM CHLORIDE 10 MEQ/100ML IV SOLN
10.0000 meq | INTRAVENOUS | Status: AC
  Administered 2012-01-20 (×2): 10 meq via INTRAVENOUS
  Filled 2012-01-20 (×2): qty 100

## 2012-01-20 MED ORDER — PANTOPRAZOLE SODIUM 40 MG PO TBEC
40.0000 mg | DELAYED_RELEASE_TABLET | Freq: Two times a day (BID) | ORAL | Status: DC
  Administered 2012-01-20 – 2012-01-21 (×3): 40 mg via ORAL
  Filled 2012-01-20 (×3): qty 1

## 2012-01-20 MED ORDER — METRONIDAZOLE IN NACL 5-0.79 MG/ML-% IV SOLN
500.0000 mg | Freq: Three times a day (TID) | INTRAVENOUS | Status: DC
  Administered 2012-01-20 – 2012-01-21 (×5): 500 mg via INTRAVENOUS
  Filled 2012-01-20 (×7): qty 100

## 2012-01-20 MED ORDER — ONDANSETRON HCL 4 MG/2ML IJ SOLN: 4.0000 mg | Freq: Four times a day (QID) | INTRAMUSCULAR | Status: DC | PRN

## 2012-01-20 NOTE — Care Management Note (Signed)
    Page 1 of 1   01/26/2012     12:14:42 PM   CARE MANAGEMENT NOTE 01/26/2012  Patient:  [name] [name], [name] [name]   Account Number:  [id number]  Date Initiated:  01/20/2012  Documentation initiated by:  Avie Arenas  Subjective/Objective Assessment:   ?? sepsis - pnuemonia or abd process.  Has family     Action/Plan:   Anticipated DC Date:  01/23/2012   Anticipated DC Plan:  HOME W HOME HEALTH SERVICES      DC Planning Services  CM consult      [facility] Choice  HOME HEALTH   Choice offered to / List presented to:  [name]        HH arranged  HH-2 PT      [facility] agency  [facility]   Status of service:  Completed, signed off Medicare Important Message given?   (If response is "NO", the following Medicare IM given date fields will be blank) Date Medicare IM given:   Date Additional Medicare IM given:    Discharge Disposition:  HOME W HOME HEALTH SERVICES  Per UR Regulation:  Reviewed for med. necessity/level of care/duration of stay  If discussed at Long Length of Stay Meetings, dates discussed:    Comments:  PCP - Internal Medicine - [facility].  Contact - Son Dorene Sorrow [phone] or Tomma Lightning [phone]  01/26/12-1047-J.Itzy Adler,RN,BSN [phone]     Noted PT recommendation for home health PT.In to speak with patient to offer choice of home health agencies. [facility]. Marie,RN with [facility] notified of referral. No further discharge/home needs identified.

## 2012-01-20 NOTE — Progress Notes (Signed)
Event:  RN called NP 2/2 pt having increased work of breathing. The pt never desatted on 2L O2NC. I asked RN to place pt on NRB. The pt was admitted tonight for an abdominal process which presented as increased SOB and chest pain. NP to bedside.  S: Since placing the NRB, the pt says she feels better. She is less SOB than she was even 10 mins ago. RN says she is not working as hard to breathe as well. Pt does endorse chest pain, but points more to the upper abdomen. She also has lower abd pain.  O: Vitals and notes/tests reviewed. Obese AAF lying in bed in NAD with NRB on. She is alert and oriented.  She is appropriate in speech and can conversate without increased work of breathing. Card reveals RRR with mild tach in the low 100s. BP is the 150s. Lungs are CTA.  A/P: 1. Acute SOB with increased work of breathing-doing better now on NRB. I ordered a stat ABG which is still pending. Her 2nd trop bumped a little but assessment up to now thought this was secondary to acute process and increased creat. Will follow trend of 3rd one. She already appears less distressed with her breathing and is resting. Will check ABG results when up.  Maren Reamer, NP [facility]

## 2012-01-20 NOTE — Progress Notes (Signed)
Throughout the day the patient has improved dramatically.  Her abdominal pain is essential gone.  Her Lactic acid level is down to 1.4 with appropriate resuscitation.  Her procalcitonin level is not elevated.  I will not explore thispatient at this time and I would suggest that she have a GI consultation.  Marta Lamas. Gae Bon, MD, FACS [phone] [facility]

## 2012-01-20 NOTE — Progress Notes (Signed)
Pt complaining of 7/10 CP to right chest. Pt states "it hurts more when you push on it". Noted breathing to be slightly labor, RR 25-33. EKG completed, see chart. Pt repositioned. Temp noted at 100.0.  Donnamarie Poag, NP notified. Will continue to monitor closely.

## 2012-01-20 NOTE — Procedures (Signed)
Central Venous Catheter Insertion Procedure Note [name] [name] [id number] [birth year]  Procedure: Insertion of Central Venous Catheter Indications: Frequent blood sampling  Procedure Details Consent: Risks of procedure as well as the alternatives and risks of each were explained to the (patient/caregiver).  Consent for procedure obtained. Time Out: Verified patient identification, verified procedure, site/side was marked, verified correct patient position, special equipment/implants available, medications/allergies/relevent history reviewed, required imaging and test results available.  Performed  Maximum sterile technique was used including antiseptics, cap, gloves, gown, hand hygiene, mask and sheet. Skin prep: Chlorhexidine; local anesthetic administered A antimicrobial bonded/coated triple lumen catheter was placed in the right internal jugular vein using the Seldinger technique.  Evaluation Blood flow good Complications: No apparent complications Patient did tolerate procedure well. Chest X-ray ordered to verify placement.  CXR: pending.  BABCOCK,PETE 01/20/2012, 12:06 PM   I was present for and supervised the entire procedure  Billy Fischer, MD;  PCCM service; Mobile [phone]

## 2012-01-20 NOTE — Progress Notes (Signed)
Inpatient Diabetes Program Recommendations  AACE/ADA: New Consensus Statement on Inpatient Glycemic Control (2009)  Target Ranges:  Prepandial:   less than 140 mg/dL      Peak postprandial:   less than 180 mg/dL (1-2 hours)      Critically ill patients:  140 - 180 mg/dL  Results for BRAILEE, RIEDE (MRN [id number]) as of 01/20/2012 13:54  Ref. Range 01/19/2012 14:26  Glucose Latest Range: 70-99 mg/dL 409 (H)    Inpatient Diabetes Program Recommendations HgbA1C: Order A1C to assess pre-hospital glucose control Random glucose >200  Thank you  Piedad Climes [facility] Inpatient Diabetes Coordinator [phone]

## 2012-01-20 NOTE — Progress Notes (Signed)
  Echocardiogram 2D Echocardiogram has been performed.  [name] [name] L 01/20/2012, 9:25 AM

## 2012-01-20 NOTE — Consult Note (Signed)
Reason for Consult:Abdominal pain and megacolon Referring Physician: PCCM  [name] [name] is an 78 y.o. female.  HPI: this patient went to [facility] yesterday with obstipation, abdominal pain, nausea, no vomiting.  WBC was 25K, Lactic acid level was 4.3.  Acute renal insufficiency.  CT with contrast shows colonic distension on the right with decompressed distal colon.  Possible obstruction, but no mass noted  I just saw the patient.  She has 7/10 abdominal pain with markedly hypoactive bowel sounds.  She is diffusely tender.  Past Medical History  Diagnosis Date  . Diabetes mellitus   . Hypertension     History reviewed. No pertinent past surgical history.  History reviewed. No pertinent family history.  Social History:  reports that she has never smoked. She does not have any smokeless tobacco history on file. She reports that she does not drink alcohol or use illicit drugs.  Allergies: No Known Allergies  Medications: I have reviewed the patient's current medications.  Results for orders placed during the hospital encounter of 01/19/12 (from the past 48 hour(s))  CBC     Status: Abnormal   Collection Time   01/19/12  2:26 PM      Component Value Range Comment   WBC 25.6 (*) 4.0 - 10.5 (K/uL)    RBC 4.68  3.87 - 5.11 (MIL/uL)    Hemoglobin 14.0  12.0 - 15.0 (g/dL)    HCT 82.9  56.2 - 13.0 (%)    MCV 91.0  78.0 - 100.0 (fL)    MCH 29.9  26.0 - 34.0 (pg)    MCHC 32.9  30.0 - 36.0 (g/dL)    RDW 86.5  78.4 - 69.6 (%)    Platelets 242  150 - 400 (K/uL)   DIFFERENTIAL     Status: Abnormal   Collection Time   01/19/12  2:26 PM      Component Value Range Comment   Neutrophils Relative 93 (*) 43 - 77 (%)    Lymphocytes Relative 3 (*) 12 - 46 (%)    Monocytes Relative 4  3 - 12 (%)    Eosinophils Relative 0  0 - 5 (%)    Basophils Relative 0  0 - 1 (%)    Neutro Abs 23.8 (*) 1.7 - 7.7 (K/uL)    Lymphs Abs 0.8  0.7 - 4.0 (K/uL)    Monocytes Absolute 1.0  0.1 - 1.0 (K/uL)     Eosinophils Absolute 0.0  0.0 - 0.7 (K/uL)    Basophils Absolute 0.0  0.0 - 0.1 (K/uL)    WBC Morphology WHITE COUNT CONFIRMED ON SMEAR      Smear Review LARGE PLATELETS PRESENT   GIANT PLATELETS SEEN  COMPREHENSIVE METABOLIC PANEL     Status: Abnormal   Collection Time   01/19/12  2:26 PM      Component Value Range Comment   Sodium 141  135 - 145 (mEq/L)    Potassium 3.0 (*) 3.5 - 5.1 (mEq/L)    Chloride 100  96 - 112 (mEq/L)    CO2 21  19 - 32 (mEq/L)    Glucose, Bld 294 (*) 70 - 99 (mg/dL)    BUN 30 (*) 6 - 23 (mg/dL)    Creatinine, Ser 2.95 (*) 0.50 - 1.10 (mg/dL)    Calcium 28.4  8.4 - 10.5 (mg/dL)    Total Protein 8.6 (*) 6.0 - 8.3 (g/dL)    Albumin 3.8  3.5 - 5.2 (g/dL)    AST 24  0 - 37 (U/L)    ALT 21  0 - 35 (U/L)    Alkaline Phosphatase 104  39 - 117 (U/L)    Total Bilirubin 0.4  0.3 - 1.2 (mg/dL)    GFR calc non Af Amer 27 (*) >90 (mL/min)    GFR calc Af Amer 31 (*) >90 (mL/min)   LIPASE, BLOOD     Status: Normal   Collection Time   01/19/12  2:26 PM      Component Value Range Comment   Lipase 30  11 - 59 (U/L)   LACTIC ACID, PLASMA     Status: Abnormal   Collection Time   01/19/12  2:32 PM      Component Value Range Comment   Lactic Acid, Venous 4.6 (*) 0.5 - 2.2 (mmol/L)   POCT I-STAT TROPONIN I     Status: Abnormal   Collection Time   01/19/12  2:47 PM      Component Value Range Comment   Troponin i, poc 0.27 (*) 0.00 - 0.08 (ng/mL)    Comment 3            URINALYSIS, ROUTINE W REFLEX MICROSCOPIC     Status: Abnormal   Collection Time   01/19/12  3:42 PM      Component Value Range Comment   Color, Urine YELLOW  YELLOW     APPearance HAZY (*) CLEAR     Specific Gravity, Urine >1.030 (*) 1.005 - 1.030     pH 5.5  5.0 - 8.0     Glucose, UA 250 (*) NEGATIVE (mg/dL)    Hgb urine dipstick NEGATIVE  NEGATIVE     Bilirubin Urine NEGATIVE  NEGATIVE     Ketones, ur NEGATIVE  NEGATIVE (mg/dL)    Protein, ur TRACE (*) NEGATIVE (mg/dL)    Urobilinogen, UA 0.2   0.0 - 1.0 (mg/dL)    Nitrite NEGATIVE  NEGATIVE     Leukocytes, UA NEGATIVE  NEGATIVE    URINE MICROSCOPIC-ADD ON     Status: Abnormal   Collection Time   01/19/12  3:42 PM      Component Value Range Comment   Squamous Epithelial / LPF RARE  RARE     WBC, UA 0-2  <3 (WBC/hpf)    RBC / HPF 0-2  <3 (RBC/hpf)    Bacteria, UA MANY (*) RARE    TROPONIN I     Status: Abnormal   Collection Time   01/19/12  3:44 PM      Component Value Range Comment   Troponin I 0.31 (*) <0.30 (ng/mL)   CULTURE, BLOOD (ROUTINE X 2)     Status: Normal (Preliminary result)   Collection Time   01/19/12  7:00 PM      Component Value Range Comment   Specimen Description BLOOD LEFT HAND      Special Requests BOTTLES DRAWN AEROBIC AND ANAEROBIC 6CC      Culture PENDING      Report Status PENDING     CULTURE, BLOOD (ROUTINE X 2)     Status: Normal (Preliminary result)   Collection Time   01/19/12  7:10 PM      Component Value Range Comment   Specimen Description BLOOD RIGHT HAND      Special Requests BOTTLES DRAWN AEROBIC AND ANAEROBIC 6CC      Culture PENDING      Report Status PENDING     GLUCOSE, CAPILLARY     Status: Abnormal   Collection Time  01/19/12 10:24 PM      Component Value Range Comment   Glucose-Capillary 184 (*) 70 - 99 (mg/dL)    Comment 1 Documented in Chart     MRSA PCR SCREENING     Status: Normal   Collection Time   01/19/12 11:09 PM      Component Value Range Comment   MRSA by PCR NEGATIVE  NEGATIVE    GLUCOSE, CAPILLARY     Status: Abnormal   Collection Time   01/19/12 11:57 PM      Component Value Range Comment   Glucose-Capillary 151 (*) 70 - 99 (mg/dL)    Comment 1 Documented in Chart      Comment 2 Notify RN     CBC     Status: Abnormal   Collection Time   01/20/12  1:29 AM      Component Value Range Comment   WBC 25.4 (*) 4.0 - 10.5 (K/uL)    RBC 3.99  3.87 - 5.11 (MIL/uL)    Hemoglobin 11.8 (*) 12.0 - 15.0 (g/dL)    HCT 09.8 (*) 11.9 - 46.0 (%)    MCV 90.0  78.0 - 100.0  (fL)    MCH 29.6  26.0 - 34.0 (pg)    MCHC 32.9  30.0 - 36.0 (g/dL)    RDW 14.7  82.9 - 56.2 (%)    Platelets 215  150 - 400 (K/uL)   CREATININE, SERUM     Status: Abnormal   Collection Time   01/20/12  1:29 AM      Component Value Range Comment   Creatinine, Ser 1.43 (*) 0.50 - 1.10 (mg/dL)    GFR calc non Af Amer 34 (*) >90 (mL/min)    GFR calc Af Amer 40 (*) >90 (mL/min)   CARDIAC PANEL(CRET KIN+CKTOT+MB+TROPI)     Status: Abnormal   Collection Time   01/20/12  1:29 AM      Component Value Range Comment   Total CK 264 (*) 7 - 177 (U/L)    CK, MB 8.3 (*) 0.3 - 4.0 (ng/mL)    Troponin I 0.39 (*) <0.30 (ng/mL)    Relative Index 3.1 (*) 0.0 - 2.5    BLOOD GAS, ARTERIAL     Status: Abnormal   Collection Time   01/20/12  5:30 AM      Component Value Range Comment   FIO2 1.00      Delivery systems NON-REBREATHER OXYGEN MASK      pH, Arterial 7.300 (*) 7.350 - 7.400     pCO2 arterial 38.0  35.0 - 45.0 (mmHg)    pO2, Arterial 151.0 (*) 80.0 - 100.0 (mmHg)    Bicarbonate 18.2 (*) 20.0 - 24.0 (mEq/L)    TCO2 19.4  0 - 100 (mmol/L)    Acid-base deficit 7.1 (*) 0.0 - 2.0 (mmol/L)    O2 Saturation 99.3      Patient temperature 97.9      Collection site RIGHT RADIAL      Drawn by [phone]      Sample type ARTERIAL      Allens test (pass/fail) PASS  PASS    BASIC METABOLIC PANEL     Status: Abnormal   Collection Time   01/20/12  5:50 AM      Component Value Range Comment   Sodium 134 (*) 135 - 145 (mEq/L)    Potassium 4.1  3.5 - 5.1 (mEq/L)    Chloride 100  96 - 112 (mEq/L)    CO2  18 (*) 19 - 32 (mEq/L)    Glucose, Bld 191 (*) 70 - 99 (mg/dL)    BUN 39 (*) 6 - 23 (mg/dL)    Creatinine, Ser 1.61 (*) 0.50 - 1.10 (mg/dL)    Calcium 8.0 (*) 8.4 - 10.5 (mg/dL)    GFR calc non Af Amer 31 (*) >90 (mL/min)    GFR calc Af Amer 36 (*) >90 (mL/min)   CBC     Status: Abnormal   Collection Time   01/20/12  5:50 AM      Component Value Range Comment   WBC 22.4 (*) 4.0 - 10.5 (K/uL)    RBC 3.68  (*) 3.87 - 5.11 (MIL/uL)    Hemoglobin 11.1 (*) 12.0 - 15.0 (g/dL)    HCT 09.6 (*) 04.5 - 46.0 (%)    MCV 91.3  78.0 - 100.0 (fL)    MCH 30.2  26.0 - 34.0 (pg)    MCHC 33.0  30.0 - 36.0 (g/dL)    RDW 40.9  81.1 - 91.4 (%)    Platelets 198  150 - 400 (K/uL)     Ct Abdomen Pelvis W Contrast  01/19/2012  *RADIOLOGY REPORT*  Clinical Data: Abdominal pain  CT ABDOMEN AND PELVIS WITH CONTRAST  Technique:  Multidetector CT imaging of the abdomen and pelvis was performed following the standard protocol during bolus administration of intravenous contrast.  Contrast: OMNIPAQUE IOHEXOL 300 MG/ML  SOLN  Comparison: 04/18/2009  Findings: Large hiatal hernia again noted.  New areas of wedge- shaped bilateral lower lobe probable atelectasis noted with mild bronchial wall thickening.  Incompletely visualized lingular patchy airspace opacity predominately on the first image of the study is noted.  Liver, gallbladder, adrenal glands, spleen, and pancreas are normal.  Mild renal cortical thinning noted bilaterally.  Imaging through the kidneys is minimally degraded by motion artifact but no cortical lesion is identified.  No hydronephrosis.  No radiopaque renal or ureteral calculus allowing for technique.  The kidneys symmetrically and bilaterally enhance and excrete contrast into nondilated renal collecting systems.  Moderate gas and fluid noted throughout the colon with gradual decompression at the level of the mid descending colon to decompressed redundant sigmoid colon.  No mass lesion, bowel wall thickening, or dilatation is otherwise identified.  Small bowel is decompressed.  Appendix is normal.  Mild atherosclerotic aortic calcification without aneurysm.  Lobulated uterine contour again noted, likely due to fibroids. Right ovary is normal.  Left ovary not identified but no adnexal mass is seen on the left.  No pelvic free fluid or lymphadenopathy. Bladder is normal.  Degenerative changes are noted in the spine.   No acute osseous finding.  Minimal lower thoracic central vertebral body height loss noted without overt compression deformity.  IMPRESSION: Proximal and mid colonic prominence with air fluid levels but gradual decompression to a normal caliber decompressed sigmoid colon.  Localized ileus or non visualized adhesion/mass lesion are within the differential diagnosis.  The pattern is not typical for volvulus given the gradual decompression of the colon.  Incompletely imaged lingular and bilateral lower lobe airspace opacities.  Atelectasis or early pneumonia could have this appearance.  Original Report Authenticated By: Harrel Lemon, M.D.   Dg Abd Acute W/chest  01/19/2012  *RADIOLOGY REPORT*  Clinical Data: Right-sided chest and abdominal pain  ACUTE ABDOMEN SERIES (ABDOMEN 2 VIEW & CHEST 1 VIEW)  Comparison: No similar prior study is available for comparison.Lumbar spine radiographs 08/12/2010 are reviewed.  Findings: Moderate hiatal hernia noted.  Cardiac leads obscure detail.  Curvilinear left mid lung zone atelectasis or scarring noted. Low lung volumes with crowding of the bronchovascular markings.  Patchy right lower lobe airspace opacity is noted particularly at the right cardiophrenic angle.  Small right pleural effusion or thickening noted.  No free air beneath the diaphragms. There are multiple air-fluid levels with diffuse mild apparent colonic distention.  No gas filled dilated loop of small bowel is seen.  Mild lumbar spine degenerative change is again identified.  IMPRESSION: Mild colonic prominence and air fluid levels which may suggest ileus.  Ill-defined right cardiophrenic angle airspace opacity could reflect atelectasis or possibly early pneumonia in the appropriate clinical context.  No dilated gas filled loop of small bowel.  Original Report Authenticated By: Harrel Lemon, M.D.    Review of Systems  Constitutional: Negative.   HENT: Negative.   Eyes: Negative.   Respiratory:  Negative.   Cardiovascular: Negative.   Gastrointestinal: Positive for nausea, abdominal pain (diffuse sudden onset) and constipation. Negative for vomiting and blood in stool.  Genitourinary: Positive for frequency (decreased frequency).  Musculoskeletal: Negative.   Skin: Positive for rash (Upper facial "butterfly" rash).  Neurological: Negative.   Endo/Heme/Allergies: Negative.   Psychiatric/Behavioral: Negative.    Blood pressure 153/79, pulse 112, temperature 98.3 F (36.8 C), temperature source Axillary, resp. rate 23, height 5\' 6"  (1.676 m), weight 77.7 kg (171 lb 4.8 oz), SpO2 98.00%. Physical Exam  Constitutional: She appears well-developed and well-nourished.  HENT:  Head: Normocephalic and atraumatic.    Cardiovascular: Normal rate, regular rhythm and normal heart sounds.   Respiratory: Effort normal and breath sounds normal.  GI: She exhibits distension (marked) and pulsatile midline mass. She exhibits no shifting dullness, no pulsatile liver, no fluid wave, no abdominal bruit, no ascites and no mass. Bowel sounds are absent. There is no hepatosplenomegaly. There is generalized tenderness (diffuse). There is no CVA tenderness. No hernia.  Musculoskeletal: Normal range of motion.  Skin: Skin is warm.  Psychiatric: Her speech is normal. Her mood appears anxious. She is agitated.    Assessment/Plan: Diffuse peritonitis with megacolon on the right and transverse colon. Lactic acidosis Possible sepsis  Likely will need colectomy with colostomy, possible total abdominal colectomy with possible ileostomy.  I have briefly spoken with the patient's daughter-in-law.  They will get here later this morning, closer to noon.  Will come back and reassess.  Tinia Oravec III,Johnathon Olden O 01/20/2012, 8:47 AM

## 2012-01-20 NOTE — Consult Note (Addendum)
Name: [name] [name] MRN: [id number] DOB: [birth year]    LOS: 1  PULMONARY / CRITICAL CARE MEDICINE  HPI:  77yo AA female presented to [facility] on 5/13 with complaints of ABD pain, nausea, constipation and unable to urinate for 2 days. Although history is difficult to obtain patient denies fever, chills, or vomiting. Patient was found to have leukocytosis (25K), lactic acidosis, and acute renal failure. CT of ABD showed possible obstruction and colonic distention. Patient transferred to [facility] ICU 5/14. On 5/14 patient became increasingly short of breath and required 100% NRB. PCCM was asked to evaluate.   Past Medical History  Diagnosis Date  . Diabetes mellitus   . Hypertension    History reviewed. No pertinent past surgical history. Prior to Admission medications   Medication Sig Start Date End Date Taking? Authorizing Provider  clopidogrel (PLAVIX) 75 MG tablet Take 75 mg by mouth daily.   Yes Historical Provider, MD  cycloSPORINE (RESTASIS) 0.05 % ophthalmic emulsion Place 1 drop into both eyes 2 (two) times daily.   Yes Historical Provider, MD  furosemide (LASIX) 20 MG tablet Take 20 mg by mouth daily.   Yes Historical Provider, MD  levothyroxine (SYNTHROID, LEVOTHROID) 75 MCG tablet Take 75 mcg by mouth daily.   Yes Historical Provider, MD  metoprolol succinate (TOPROL-XL) 25 MG 24 hr tablet Take 25 mg by mouth 2 (two) times daily.   Yes Historical Provider, MD  Multiple Vitamin (MULITIVITAMIN WITH MINERALS) TABS Take 1 tablet by mouth at bedtime.   Yes Historical Provider, MD  naproxen sodium (ALEVE) 220 MG tablet Take 220 mg by mouth 2 (two) times daily as needed. Back Pain   Yes Historical Provider, MD  oxybutynin (DITROPAN-XL) 10 MG 24 hr tablet Take 10 mg by mouth daily.   Yes Historical Provider, MD  pantoprazole (PROTONIX) 40 MG tablet Take 40 mg by mouth 2 (two) times daily.   Yes Historical Provider, MD  simvastatin (ZOCOR) 20 MG tablet Take 20 mg by mouth every evening.   Yes  Historical Provider, MD  temazepam (RESTORIL) 15 MG capsule Take 15 mg by mouth at bedtime as needed. Sleep   Yes Historical Provider, MD  valsartan (DIOVAN) 320 MG tablet Take 320 mg by mouth daily.   Yes Historical Provider, MD   Allergies No Known Allergies  Family History History reviewed. No pertinent family history. Social History  reports that she has never smoked. She does not have any smokeless tobacco history on file. She reports that she does not drink alcohol or use illicit drugs.  Review Of Systems:  Unreliable due to acute confusion  Brief patient description:  78 yo AA female admitted from [facility] on 5/14 for possible bowl obstruction, leukocytosis, and lactic acidosis. Required increase in oxygen requirements throughout the night on 5/14.    Events Since Admission: 5/14: 100% NRB    Vital Signs: Temp:  [97.4 F (36.3 C)-98.3 F (36.8 C)] 98.3 F (36.8 C) (05/14 0814) Pulse Rate:  [96-117] 112  (05/14 0800) Resp:  [18-30] 23  (05/14 0800) BP: (125-165)/(69-98) 153/79 mmHg (05/14 0800) SpO2:  [91 %-100 %] 98 % (05/14 0800) Weight:  [77.7 kg (171 lb 4.8 oz)] 77.7 kg (171 lb 4.8 oz) (05/13 2315)  Physical Examination: General: Elderly AA female in mild distress Neuro:  GCS: 14, oriented to person only. No focal deficits HEENT:  PERRL, pink conjunctivae, dry MM Neck:  Supple, no JVD   Cardiovascular:  RRR, no M/R/G Lungs:  Clear bilateraly, no  W/R/R Abdomen:  Hypoactive BS, diffusely tender, soft  Musculoskeletal:  Moves all extremities, +1 edema BLE Skin:  No rash  Active Problems:  Abdominal pain  Sepsis  Dehydration  Hypokalemia  Lactic acid acidosis  Elevated troponin  Ileus  Hypothyroidism   ASSESSMENT AND PLAN  PULMONARY  Lab 01/20/12 0530  PHART 7.300*  PCO2ART 38.0  PO2ART 151.0*  HCO3 18.2*  O2SAT 99.3   Ventilator Settings:   CXR:  Right lower lobe airspace disease, with left lower lobe atelectasis  ETT:    A: Acute  respiratory failure with RLL airspace diease and left sided atelectasis ? aspiration P:   -Continue supplemental oxygen -maintain sats >92% -f/u CXR  CARDIOVASCULAR  Lab 01/20/12 0129 01/19/12 1544 01/19/12 1432  TROPONINI 0.39* 0.31* --  LATICACIDVEN -- -- 4.6*  PROBNP -- -- --   ECG:   Lines: CVL 5/14>>>  A: Elevated Troponin P:  -f/u troponin  -Echo pending  -continue low does BB -continue statin  RENAL  Lab 01/20/12 0550 01/20/12 0129 01/19/12 1426  NA 134* -- 141  K 4.1 -- 3.0*  CL 100 -- 100  CO2 18* -- 21  BUN 39* -- 30*  CREATININE 1.56* 1.43* 1.73*  CALCIUM 8.0* -- 10.2  MG -- -- --  PHOS -- -- --   Intake/Output      05/13 0701 - 05/14 0700 05/14 0701 - 05/15 0700   P.O. 250    I.V. (mL/kg) 700 (9) 100 (1.3)   IV Piggyback 150    Total Intake(mL/kg) 1100 (14.2) 100 (1.3)   Urine (mL/kg/hr) 1 (0)    Total Output 1    Net +1099 +100         Foley:  5/13  A:  Acute renal failure P:   -Continue IVF hydration -trend sCr  A: +anion gap metabolic acidosis- (Lactic acid) P: -trend lactate -continue ABX -Place CVL for adequate hemodynamic monitoring   GASTROINTESTINAL  Lab 01/19/12 1426  AST 24  ALT 21  ALKPHOS 104  BILITOT 0.4  PROT 8.6*  ALBUMIN 3.8    A:  Peritonitis vs. colitis  with megacolon Spoke with general surgery, will need CVL for TPN and hemodynamic monitoring   P:   -Per Surgery -bowel rest -continue ABX  HEMATOLOGIC  Lab 01/20/12 0550 01/20/12 0129 01/19/12 1426  HGB 11.1* 11.8* 14.0  HCT 33.6* 35.9* 42.6  PLT 198 215 242  INR -- -- --  APTT -- -- --   A:  Anemia P:  -monitor -< 7 will transfuse  INFECTIOUS  Lab 01/20/12 0550 01/20/12 0129 01/19/12 1426  WBC 22.4* 25.4* 25.6*  PROCALCITON -- -- --   Cultures: 5/13-BC X2>>> 5/13- Urine >>> 5/13- MRSA >>> neg   Antibiotics: Zosyn 5/13>>> Flagyl 5/14>>>  A:  SIRS P:   -recheck lactic acid, PCT -continue ABX -trend WBC -place CVL, check  CVP  ENDOCRINE  Lab 01/19/12 2357 01/19/12 2224  GLUCAP 151* 184*   A:  Hyperglycemia  P:   -monitor CBG  NEUROLOGIC  A:  Acute encephalopathy  P:   -correct acidosis -supportive care  BEST PRACTICE / DISPOSITION - Level of Care:  SDU - Primary Service:  TRH - Consultants:  Surgery - Code Status:  Full - Diet:  NPO - DVT Px:  SQ heparin - GI Px:  Protonix    BABCOCK,PETE, NP-C  01/20/2012, 9:10 AM   Seen on CCM rounds this morning with resident MD or ACNP above.  Discussed  with Dr Lindie Spruce.  Pt examined and database reviewed. I agree with above findings, assessment and plan as reflected in the note above. She has apparently improved substantially from the riginal eval by Dr Lindie Spruce. We agree that she is able to go to SDU safely. Will defer further medical care to [facility] and further surgical eval to Dr Darlys Gales al  Billy Fischer, MD;  PCCM service; Mobile [phone]

## 2012-01-20 NOTE — Progress Notes (Signed)
UR Completed.  [name] [name] Jane [phone] 01/20/2012  

## 2012-01-20 NOTE — H&P (Signed)
PCP:   Eino Farber, MD, MD   Chief Complaint: Abdominal pain   HPI: [name] [name] is an 78 y.o. female with history of diabetes, hypertension, hypothyroidism, hypercholesterolemia, presents to [facility] emergency room with 2 days history of abdominal discomfort, nausea, vomiting food, but no fever, chills, black stool bloody stool. She denied any chest pain, but admitted to have mild shortness of breath. Evaluation in emergency room included leukocytosis with white count of 25.6 thousand, blood glucose of 294, elevated creatinine to 1.73, lactic acid of 4.6, troponin of 0.3 bicarbonate of 21, normal liver function tests, and potassium of 3.0. Her abdominal plain film shows air-fluid levels consistent with ileus, and a right patchy infiltrate in the lung. Abdominal CT confirmed air fluid level which could be consistent with an ileus. There was no transition point. General surgery was consulted at [facility] emergency room who did not feel that she has a surgical abdomen, and recommended that she be transferred to [facility]. Pulmonary critical care was consulted as well, by the emergency room physician, and felt that she can be admitted to medicine service. She stated she takes no narcotics on a regular basis. She does take temazepam to sleep. Her EKG showed no acute changes.  Rewiew of Systems:  The patient denies  fever, weight loss,, vision loss, decreased hearing, hoarseness, chest pain, syncope, dyspnea on exertion, peripheral edema, balance deficits, hemoptysis, melena, hematochezia, severe indigestion/heartburn, hematuria, incontinence, genital sores, muscle weakness, suspicious skin lesions, transient blindness, difficulty walking, depression, unusual weight change, abnormal bleeding, enlarged lymph nodes, angioedema, and breast masses.    Past Medical History  Diagnosis Date  . Diabetes mellitus   . Hypertension     History reviewed. No pertinent past surgical  history.  Medications:  HOME MEDS: Prior to Admission medications   Medication Sig Start Date End Date Taking? Authorizing Provider  clopidogrel (PLAVIX) 75 MG tablet Take 75 mg by mouth daily.   Yes Historical Provider, MD  cycloSPORINE (RESTASIS) 0.05 % ophthalmic emulsion Place 1 drop into both eyes 2 (two) times daily.   Yes Historical Provider, MD  furosemide (LASIX) 20 MG tablet Take 20 mg by mouth daily.   Yes Historical Provider, MD  levothyroxine (SYNTHROID, LEVOTHROID) 75 MCG tablet Take 75 mcg by mouth daily.   Yes Historical Provider, MD  metoprolol succinate (TOPROL-XL) 25 MG 24 hr tablet Take 25 mg by mouth 2 (two) times daily.   Yes Historical Provider, MD  Multiple Vitamin (MULITIVITAMIN WITH MINERALS) TABS Take 1 tablet by mouth at bedtime.   Yes Historical Provider, MD  naproxen sodium (ALEVE) 220 MG tablet Take 220 mg by mouth 2 (two) times daily as needed. Back Pain   Yes Historical Provider, MD  oxybutynin (DITROPAN-XL) 10 MG 24 hr tablet Take 10 mg by mouth daily.   Yes Historical Provider, MD  pantoprazole (PROTONIX) 40 MG tablet Take 40 mg by mouth 2 (two) times daily.   Yes Historical Provider, MD  simvastatin (ZOCOR) 20 MG tablet Take 20 mg by mouth every evening.   Yes Historical Provider, MD  temazepam (RESTORIL) 15 MG capsule Take 15 mg by mouth at bedtime as needed. Sleep   Yes Historical Provider, MD  valsartan (DIOVAN) 320 MG tablet Take 320 mg by mouth daily.   Yes Historical Provider, MD     Allergies:  No Known Allergies  Social History:   reports that she has never smoked. She does not have any smokeless tobacco history on file. She  reports that she does not drink alcohol or use illicit drugs.  Family History: History reviewed. No pertinent family history.   Physical Exam: Filed Vitals:   01/19/12 2154 01/19/12 2315 01/19/12 2330 01/19/12 2345  BP: 158/82 145/93 165/84   Pulse:  104 104 104  Temp: 97.8 F (36.6 C)     TempSrc: Oral     Resp:  24 24 24 23   Height:      Weight:  77.7 kg (171 lb 4.8 oz)    SpO2: 96% 96% 95% 96%   Blood pressure 165/84, pulse 104, temperature 97.8 F (36.6 C), temperature source Oral, resp. rate 23, height 5\' 6"  (1.676 m), weight 77.7 kg (171 lb 4.8 oz), SpO2 96.00%.  GEN:  Pleasant person lying in the stretcher in no acute distress; cooperative with exam PSYCH:  alert and oriented x4; does not appear anxious or depressed; affect is appropriate. HEENT: Mucous membranes pink and anicteric; PERRLA; EOM intact; no cervical lymphadenopathy nor thyromegaly or carotid bruit; no JVD; Breasts:: Not examined CHEST WALL: No tenderness CHEST: Normal respiration, clear to auscultation bilaterally HEART: Regular rate and rhythm; no murmurs rubs or gallops BACK: No kyphosis or scoliosis; no CVA tenderness ABDOMEN: Obese, soft, diffusely tender but no rebound; no masses, no organomegaly, decreased but present abdominal bowel sounds; no pannus; no intertriginous candida. Rectal Exam: Not done EXTREMITIES: No bone or joint deformity; age-appropriate arthropathy of the hands and knees; no edema; no ulcerations. Genitalia: not examined PULSES: 2+ and symmetric SKIN: Normal hydration no rash or ulceration CNS: Cranial nerves 2-12 grossly intact no focal lateralizing neurologic deficit   Labs & Imaging Results for orders placed during the hospital encounter of 01/19/12 (from the past 48 hour(s))  CBC     Status: Abnormal   Collection Time   01/19/12  2:26 PM      Component Value Range Comment   WBC 25.6 (*) 4.0 - 10.5 (K/uL)    RBC 4.68  3.87 - 5.11 (MIL/uL)    Hemoglobin 14.0  12.0 - 15.0 (g/dL)    HCT 08.6  57.8 - 46.9 (%)    MCV 91.0  78.0 - 100.0 (fL)    MCH 29.9  26.0 - 34.0 (pg)    MCHC 32.9  30.0 - 36.0 (g/dL)    RDW 62.9  52.8 - 41.3 (%)    Platelets 242  150 - 400 (K/uL)   DIFFERENTIAL     Status: Abnormal   Collection Time   01/19/12  2:26 PM      Component Value Range Comment   Neutrophils  Relative 93 (*) 43 - 77 (%)    Lymphocytes Relative 3 (*) 12 - 46 (%)    Monocytes Relative 4  3 - 12 (%)    Eosinophils Relative 0  0 - 5 (%)    Basophils Relative 0  0 - 1 (%)    Neutro Abs 23.8 (*) 1.7 - 7.7 (K/uL)    Lymphs Abs 0.8  0.7 - 4.0 (K/uL)    Monocytes Absolute 1.0  0.1 - 1.0 (K/uL)    Eosinophils Absolute 0.0  0.0 - 0.7 (K/uL)    Basophils Absolute 0.0  0.0 - 0.1 (K/uL)    WBC Morphology WHITE COUNT CONFIRMED ON SMEAR      Smear Review LARGE PLATELETS PRESENT   GIANT PLATELETS SEEN  COMPREHENSIVE METABOLIC PANEL     Status: Abnormal   Collection Time   01/19/12  2:26 PM  Component Value Range Comment   Sodium 141  135 - 145 (mEq/L)    Potassium 3.0 (*) 3.5 - 5.1 (mEq/L)    Chloride 100  96 - 112 (mEq/L)    CO2 21  19 - 32 (mEq/L)    Glucose, Bld 294 (*) 70 - 99 (mg/dL)    BUN 30 (*) 6 - 23 (mg/dL)    Creatinine, Ser 1.61 (*) 0.50 - 1.10 (mg/dL)    Calcium 09.6  8.4 - 10.5 (mg/dL)    Total Protein 8.6 (*) 6.0 - 8.3 (g/dL)    Albumin 3.8  3.5 - 5.2 (g/dL)    AST 24  0 - 37 (U/L)    ALT 21  0 - 35 (U/L)    Alkaline Phosphatase 104  39 - 117 (U/L)    Total Bilirubin 0.4  0.3 - 1.2 (mg/dL)    GFR calc non Af Amer 27 (*) >90 (mL/min)    GFR calc Af Amer 31 (*) >90 (mL/min)   LIPASE, BLOOD     Status: Normal   Collection Time   01/19/12  2:26 PM      Component Value Range Comment   Lipase 30  11 - 59 (U/L)   LACTIC ACID, PLASMA     Status: Abnormal   Collection Time   01/19/12  2:32 PM      Component Value Range Comment   Lactic Acid, Venous 4.6 (*) 0.5 - 2.2 (mmol/L)   POCT I-STAT TROPONIN I     Status: Abnormal   Collection Time   01/19/12  2:47 PM      Component Value Range Comment   Troponin i, poc 0.27 (*) 0.00 - 0.08 (ng/mL)    Comment 3            URINALYSIS, ROUTINE W REFLEX MICROSCOPIC     Status: Abnormal   Collection Time   01/19/12  3:42 PM      Component Value Range Comment   Color, Urine YELLOW  YELLOW     APPearance HAZY (*) CLEAR      Specific Gravity, Urine >1.030 (*) 1.005 - 1.030     pH 5.5  5.0 - 8.0     Glucose, UA 250 (*) NEGATIVE (mg/dL)    Hgb urine dipstick NEGATIVE  NEGATIVE     Bilirubin Urine NEGATIVE  NEGATIVE     Ketones, ur NEGATIVE  NEGATIVE (mg/dL)    Protein, ur TRACE (*) NEGATIVE (mg/dL)    Urobilinogen, UA 0.2  0.0 - 1.0 (mg/dL)    Nitrite NEGATIVE  NEGATIVE     Leukocytes, UA NEGATIVE  NEGATIVE    URINE MICROSCOPIC-ADD ON     Status: Abnormal   Collection Time   01/19/12  3:42 PM      Component Value Range Comment   Squamous Epithelial / LPF RARE  RARE     WBC, UA 0-2  <3 (WBC/hpf)    RBC / HPF 0-2  <3 (RBC/hpf)    Bacteria, UA MANY (*) RARE    TROPONIN I     Status: Abnormal   Collection Time   01/19/12  3:44 PM      Component Value Range Comment   Troponin I 0.31 (*) <0.30 (ng/mL)   CULTURE, BLOOD (ROUTINE X 2)     Status: Normal (Preliminary result)   Collection Time   01/19/12  7:00 PM      Component Value Range Comment   Specimen Description BLOOD LEFT HAND  Special Requests BOTTLES DRAWN AEROBIC AND ANAEROBIC 6CC      Culture PENDING      Report Status PENDING     CULTURE, BLOOD (ROUTINE X 2)     Status: Normal (Preliminary result)   Collection Time   01/19/12  7:10 PM      Component Value Range Comment   Specimen Description BLOOD RIGHT HAND      Special Requests BOTTLES DRAWN AEROBIC AND ANAEROBIC 6CC      Culture PENDING      Report Status PENDING     MRSA PCR SCREENING     Status: Normal   Collection Time   01/19/12 11:09 PM      Component Value Range Comment   MRSA by PCR NEGATIVE  NEGATIVE    GLUCOSE, CAPILLARY     Status: Abnormal   Collection Time   01/19/12 11:57 PM      Component Value Range Comment   Glucose-Capillary 151 (*) 70 - 99 (mg/dL)    Comment 1 Documented in Chart      Comment 2 Notify RN      Ct Abdomen Pelvis W Contrast  01/19/2012  *RADIOLOGY REPORT*  Clinical Data: Abdominal pain  CT ABDOMEN AND PELVIS WITH CONTRAST  Technique:  Multidetector CT  imaging of the abdomen and pelvis was performed following the standard protocol during bolus administration of intravenous contrast.  Contrast: OMNIPAQUE IOHEXOL 300 MG/ML  SOLN  Comparison: 04/18/2009  Findings: Large hiatal hernia again noted.  New areas of wedge- shaped bilateral lower lobe probable atelectasis noted with mild bronchial wall thickening.  Incompletely visualized lingular patchy airspace opacity predominately on the first image of the study is noted.  Liver, gallbladder, adrenal glands, spleen, and pancreas are normal.  Mild renal cortical thinning noted bilaterally.  Imaging through the kidneys is minimally degraded by motion artifact but no cortical lesion is identified.  No hydronephrosis.  No radiopaque renal or ureteral calculus allowing for technique.  The kidneys symmetrically and bilaterally enhance and excrete contrast into nondilated renal collecting systems.  Moderate gas and fluid noted throughout the colon with gradual decompression at the level of the mid descending colon to decompressed redundant sigmoid colon.  No mass lesion, bowel wall thickening, or dilatation is otherwise identified.  Small bowel is decompressed.  Appendix is normal.  Mild atherosclerotic aortic calcification without aneurysm.  Lobulated uterine contour again noted, likely due to fibroids. Right ovary is normal.  Left ovary not identified but no adnexal mass is seen on the left.  No pelvic free fluid or lymphadenopathy. Bladder is normal.  Degenerative changes are noted in the spine.  No acute osseous finding.  Minimal lower thoracic central vertebral body height loss noted without overt compression deformity.  IMPRESSION: Proximal and mid colonic prominence with air fluid levels but gradual decompression to a normal caliber decompressed sigmoid colon.  Localized ileus or non visualized adhesion/mass lesion are within the differential diagnosis.  The pattern is not typical for volvulus given the gradual  decompression of the colon.  Incompletely imaged lingular and bilateral lower lobe airspace opacities.  Atelectasis or early pneumonia could have this appearance.  Original Report Authenticated By: Harrel Lemon, M.D.   Dg Abd Acute W/chest  01/19/2012  *RADIOLOGY REPORT*  Clinical Data: Right-sided chest and abdominal pain  ACUTE ABDOMEN SERIES (ABDOMEN 2 VIEW & CHEST 1 VIEW)  Comparison: No similar prior study is available for comparison.Lumbar spine radiographs 08/12/2010 are reviewed.  Findings: Moderate hiatal hernia  noted.  Cardiac leads obscure detail.  Curvilinear left mid lung zone atelectasis or scarring noted. Low lung volumes with crowding of the bronchovascular markings.  Patchy right lower lobe airspace opacity is noted particularly at the right cardiophrenic angle.  Small right pleural effusion or thickening noted.  No free air beneath the diaphragms. There are multiple air-fluid levels with diffuse mild apparent colonic distention.  No gas filled dilated loop of small bowel is seen.  Mild lumbar spine degenerative change is again identified.  IMPRESSION: Mild colonic prominence and air fluid levels which may suggest ileus.  Ill-defined right cardiophrenic angle airspace opacity could reflect atelectasis or possibly early pneumonia in the appropriate clinical context.  No dilated gas filled loop of small bowel.  Original Report Authenticated By: Harrel Lemon, M.D.      Assessment Present on Admission:  .Hypokalemia .Elevated troponin .Hypothyroidism Sepsis Ileus Possible pneumonia  PLAN:  This patient has an intra-abdominal process which could be an ileus, less likely is ischemic bowel, and possible malignancy. She does have hypo thyroidism and hypokalemia which can cause an ileus. She also on temazepam which can aggravate her symptoms as well. She is volume depleted and I suspect that her elevated troponin may be secondary to her elevated creatinine. Is possible that she  might have early sepsis as well, with elevated white count, toxic granulation, and elevated lactic acid. She is ill, and I will place her in the step down. I call her son to clarify her CODE STATUS, but I was unable to reach him. Will give her IV fluids, continue to cycle her troponins, follow her creatinine closely, and replete her potassium. I noted that she is on Plavix, and we will hold Plavix in case she may end up requiring surgery. She will be placed on Zosyn, which will cover her most intra-abdominal pathogens. I am not convinced that she has pneumonia, given lack of symptoms, but if she did, and it can certainly affect her abdominal process. Hopefully, her intra-abdominal process would resolve with supportive care.   Other plans as per orders.    Ashon Rosenberg 01/20/2012, 1:19 AM

## 2012-01-21 ENCOUNTER — Inpatient Hospital Stay (HOSPITAL_COMMUNITY): Payer: Medicare Other

## 2012-01-21 DIAGNOSIS — K5900 Constipation, unspecified: Secondary | ICD-10-CM

## 2012-01-21 DIAGNOSIS — E860 Dehydration: Secondary | ICD-10-CM

## 2012-01-21 DIAGNOSIS — D72829 Elevated white blood cell count, unspecified: Secondary | ICD-10-CM

## 2012-01-21 DIAGNOSIS — E872 Acidosis: Secondary | ICD-10-CM

## 2012-01-21 LAB — BASIC METABOLIC PANEL
CO2: 23 mEq/L (ref 19–32)
Chloride: 104 mEq/L (ref 96–112)
Glucose, Bld: 209 mg/dL — ABNORMAL HIGH (ref 70–99)
Potassium: 3.9 mEq/L (ref 3.5–5.1)
Sodium: 136 mEq/L (ref 135–145)

## 2012-01-21 LAB — CBC
Hemoglobin: 9.8 g/dL — ABNORMAL LOW (ref 12.0–15.0)
RBC: 3.29 MIL/uL — ABNORMAL LOW (ref 3.87–5.11)

## 2012-01-21 MED ORDER — VANCOMYCIN 50 MG/ML ORAL SOLUTION
500.0000 mg | Freq: Four times a day (QID) | ORAL | Status: DC
  Administered 2012-01-21 – 2012-01-23 (×7): 500 mg via ORAL
  Filled 2012-01-21 (×12): qty 10

## 2012-01-21 MED ORDER — FLEET ENEMA 7-19 GM/118ML RE ENEM
1.0000 | ENEMA | Freq: Once | RECTAL | Status: AC
  Administered 2012-01-21: 1 via RECTAL
  Filled 2012-01-21: qty 1

## 2012-01-21 MED ORDER — METRONIDAZOLE IN NACL 5-0.79 MG/ML-% IV SOLN
500.0000 mg | Freq: Three times a day (TID) | INTRAVENOUS | Status: DC
  Administered 2012-01-21 – 2012-01-22 (×2): 500 mg via INTRAVENOUS
  Filled 2012-01-21 (×4): qty 100

## 2012-01-21 MED ORDER — PIPERACILLIN-TAZOBACTAM 3.375 G IVPB 30 MIN
3.3750 g | Freq: Three times a day (TID) | INTRAVENOUS | Status: DC
  Administered 2012-01-21 – 2012-01-22 (×5): 3.375 g via INTRAVENOUS
  Filled 2012-01-21 (×9): qty 50

## 2012-01-21 NOTE — Progress Notes (Signed)
Event:  RN called NP earlier 2/2 pt c/o chest pain. However, pt stated pain was on right side of chest and RN could reproduce pain with palpation. Chart reviewed and pt has had neg trops since here. She had some chest pain yesterday thought to be more from her abdomen pushing up 2/2 abdominal issues (see CT and surgery note). Morphine ordered. RN to call if recurs.  Maren Reamer, NP [facility]

## 2012-01-21 NOTE — Plan of Care (Signed)
Problem: Consults Goal: Nutrition Consult-if indicated Outcome: Progressing Patient is still on clear liquid diet.  Problem: Phase I Progression Outcomes Goal: Voiding-avoid urinary catheter unless indicated Outcome: Not Progressing Patient requires foley d/t urinary retention.

## 2012-01-21 NOTE — Progress Notes (Signed)
[facility] [place] TEAM 1 - Stepdown/ICU TEAM  PCP:  Eino Farber, MD, MD  Subjective: 78 y.o. female with history of diabetes, hypertension, hypothyroidism, hypercholesterolemia, presents to [facility] emergency room with 2 days history of abdominal discomfort, nausea, vomiting food, but no fever, chills, black stool bloody stool. She denied any chest pain, but admitted to have mild shortness of breath. Evaluation in emergency room included leukocytosis with white count of 25.6 thousand, blood glucose of 294, elevated creatinine to 1.73, lactic acid of 4.6, troponin of 0.3 bicarbonate of 21, normal liver function tests, and potassium of 3.0. Her abdominal plain film shows air-fluid levels consistent with ileus, and a right patchy infiltrate in the lung. Abdominal CT confirmed air fluid level which could be consistent with an ileus.   The patient had a very large formed stool today.  After this, she reports that she feels much better.  She does c/o feeling somewhat SOB, but denies cp, abdom pain, or n/v.   Despite her large formed stool, a C diff PCR ordered by the PCCM team has in fact returned a positive result.  Procedures CVL 5/14>>>  Objective:  Intake/Output Summary (Last 24 hours) at 01/21/12 1450 Last data filed at 01/21/12 1208  Gross per 24 hour  Intake   2126 ml  Output   2600 ml  Net   -474 ml   Blood pressure 138/98, pulse 99, temperature 98.3 F (36.8 C), temperature source Oral, resp. rate 18, height 5\' 6"  (1.676 m), weight 79.5 kg (175 lb 4.3 oz), SpO2 97.00%.  CBG (last 3)   Basename 01/20/12 2120 01/20/12 1644 01/19/12 2357  GLUCAP 189* 184* 151*   Physical Exam: General: mildly tachypneic at rest, but able to carry on a conversation Lungs: mild bibasilar crackles - no wheeze  Cardiovascular: Regular rate and rhythm without murmur gallop or rub  Abdomen: mildly tender to deep palpation diffusely - no appreciable mass - no rebound,  bs+ Extremities: No significant cyanosis, clubbing, or edema bilateral lower extremities  Lab Results:  [facility] 01/21/12 0428 01/20/12 1215 01/20/12 0550  NA 136 136 134*  K 3.9 4.0 4.1  CL 104 102 100  CO2 23 20 18*  GLUCOSE 209* 180* 191*  BUN 34* 47* 39*  CREATININE 1.35* 1.49* 1.56*  CALCIUM 7.5* 7.7* 8.0*  MG -- -- --  PHOS -- -- --    Basename 01/19/12 1426  AST 24  ALT 21  ALKPHOS 104  BILITOT 0.4  PROT 8.6*  ALBUMIN 3.8    Basename 01/21/12 0428 01/20/12 0550 01/20/12 0129 01/19/12 1426  WBC 13.1* 22.4* 25.4* --  NEUTROABS -- -- -- 23.8*  HGB 9.8* 11.1* 11.8* --  HCT 29.3* 33.6* 35.9* --  MCV 89.1 91.3 90.0 --  PLT 174 198 215 --    Basename 01/20/12 1215 01/20/12 0129 01/19/12 1544  CKTOTAL 381* 264* --  CKMB 9.1* 8.3* --  CKMBINDEX -- -- --  TROPONINI 0.34* 0.39* 0.31*    Micro Results: Recent Results (from the past 240 hour(s))  URINE CULTURE     Status: Normal (Preliminary result)   Collection Time   01/19/12  3:42 PM      Component Value Range Status Comment   Specimen Description URINE, CATHETERIZED   Final    Special Requests NONE   Final    Culture  Setup Time [id number]   Final    Colony Count PENDING   Incomplete    Culture Culture reincubated for better  growth   Final    Report Status PENDING   Incomplete   CULTURE, BLOOD (ROUTINE X 2)     Status: Normal (Preliminary result)   Collection Time   01/19/12  7:00 PM      Component Value Range Status Comment   Specimen Description BLOOD LEFT HAND   Final    Special Requests BOTTLES DRAWN AEROBIC AND ANAEROBIC 6CC   Final    Culture NO GROWTH 2 DAYS   Final    Report Status PENDING   Incomplete   CULTURE, BLOOD (ROUTINE X 2)     Status: Normal (Preliminary result)   Collection Time   01/19/12  7:10 PM      Component Value Range Status Comment   Specimen Description BLOOD RIGHT HAND   Final    Special Requests BOTTLES DRAWN AEROBIC AND ANAEROBIC 6CC   Final    Culture NO GROWTH 2 DAYS    Final    Report Status PENDING   Incomplete   MRSA PCR SCREENING     Status: Normal   Collection Time   01/19/12 11:09 PM      Component Value Range Status Comment   MRSA by PCR NEGATIVE  NEGATIVE  Final     Studies/Results: All recent x-ray/radiology reports have been reviewed in detail.   Medications: I have reviewed the patient's complete medication list.  Assessment/Plan:  Diffuse peritonitis with megacolon on the right and transverse colon Clinically has improved markedly - has actually had a very large formed stool today - cont clear liquids for now - Surgery following along with [place]  Lactic acidosis / Possible sepsis procalcitonin normal - lactic acid now normalized - serum bicarb normalized - resolved  Acute renal failure crt peaked at 1.56 - improving with volume expansion - cont IVF support until normalized  Acute resp failure w/ BLL ASD On empiric abx coverage - WBC improving - f/uc CXR in AM  +C diff PCR I seriously doubt this pt who has just had a very large formed stool has an active C diff infection - nonetheless, her PCR was + - in the setting of a recent megacolon, I will begin empiric tx for a severe infection and monitor her course   Hypokalemia Resolved  Hypothyroidism Cont replacment tx  Elevated troponin Likely mild stress induced/hypovelmia induced ischemia - no chest pain - echo reveals no focal WMA and normal EF  Lonia Blood, MD [facility] Office  [phone] Pager [phone]  On-Call/Text Page:      [email or web address]      password [facility]

## 2012-01-21 NOTE — Progress Notes (Signed)
CRITICAL VALUE ALERT  Critical value received:  c-diff positive  Date of notification: 01/21/2012  Time of notification:  1519  Critical value read back:yes  Nurse who received alert:  Gerald Dexter, RN  MD notified (1st page):  Dr. Sharon Seller  Time of first page:  1519  MD notified (2nd page):  Time of second page:  Responding MD:  Dr. Sharon Seller  Time MD responded:  [phone]

## 2012-01-21 NOTE — Progress Notes (Signed)
[facility] Progress Note    Subjective: Patient complaining of more abdominal pain.  Has not had a BM for 4 days.  No flatus.    Objective: Vital signs in last 24 hours: Temp:  [97.6 F (36.4 C)-100 F (37.8 C)] 98 F (36.7 C) (05/15 0500) Pulse Rate:  [100-111] 100  (05/15 0823) Resp:  [18-27] 18  (05/15 0823) BP: (127-180)/(64-110) 136/67 mmHg (05/15 0823) SpO2:  [96 %-100 %] 96 % (05/15 0823) Weight:  [79.5 kg (175 lb 4.3 oz)] 79.5 kg (175 lb 4.3 oz) (05/15 0013)    Intake/Output from previous day: 05/14 0701 - 05/15 0700 In: 2723 [P.O.:20; I.V.:2303; IV Piggyback:400] Out: 3175 [Urine:3175] Intake/Output this shift:    General: Sitting in chair, not severe acute distress  Lungs: Clear  Abd: Distended, hypoactive BS, diffusely tender but moderate.  No peritonitis  Extremities: No DVT signs or symptoms  Neuro: Intact, no delirium or anxiety.  Lab Results:  WBC down to 13.1K, Hgb down to 9.8, BUN and creatinine also down. BMET  Basename 01/21/12 0428 01/20/12 1215  NA 136 136  K 3.9 4.0  CL 104 102  CO2 23 20  GLUCOSE 209* 180*  BUN 34* 47*  CREATININE 1.35* 1.49*  CALCIUM 7.5* 7.7*   PT/INR No results found for this basename: LABPROT:2,INR:2 in the last 72 hours ABG  Basename 01/20/12 0530  PHART 7.300*  HCO3 18.2*    Studies/Results: Ct Abdomen Pelvis W Contrast  01/19/2012  *RADIOLOGY REPORT*  Clinical Data: Abdominal pain  CT ABDOMEN AND PELVIS WITH CONTRAST  Technique:  Multidetector CT imaging of the abdomen and pelvis was performed following the standard protocol during bolus administration of intravenous contrast.  Contrast: OMNIPAQUE IOHEXOL 300 MG/ML  SOLN  Comparison: 04/18/2009  Findings: Large hiatal hernia again noted.  New areas of wedge- shaped bilateral lower lobe probable atelectasis noted with mild bronchial wall thickening.  Incompletely visualized lingular patchy airspace opacity predominately on the first image of the study is  noted.  Liver, gallbladder, adrenal glands, spleen, and pancreas are normal.  Mild renal cortical thinning noted bilaterally.  Imaging through the kidneys is minimally degraded by motion artifact but no cortical lesion is identified.  No hydronephrosis.  No radiopaque renal or ureteral calculus allowing for technique.  The kidneys symmetrically and bilaterally enhance and excrete contrast into nondilated renal collecting systems.  Moderate gas and fluid noted throughout the colon with gradual decompression at the level of the mid descending colon to decompressed redundant sigmoid colon.  No mass lesion, bowel wall thickening, or dilatation is otherwise identified.  Small bowel is decompressed.  Appendix is normal.  Mild atherosclerotic aortic calcification without aneurysm.  Lobulated uterine contour again noted, likely due to fibroids. Right ovary is normal.  Left ovary not identified but no adnexal mass is seen on the left.  No pelvic free fluid or lymphadenopathy. Bladder is normal.  Degenerative changes are noted in the spine.  No acute osseous finding.  Minimal lower thoracic central vertebral body height loss noted without overt compression deformity.  IMPRESSION: Proximal and mid colonic prominence with air fluid levels but gradual decompression to a normal caliber decompressed sigmoid colon.  Localized ileus or non visualized adhesion/mass lesion are within the differential diagnosis.  The pattern is not typical for volvulus given the gradual decompression of the colon.  Incompletely imaged lingular and bilateral lower lobe airspace opacities.  Atelectasis or early pneumonia could have this appearance.  Original Report Authenticated By: Estevan Oaks  GREEN, M.D.   Dg Chest Port 1 View  01/20/2012  *RADIOLOGY REPORT*  Clinical Data: Right jugular line placement  PORTABLE CHEST - 1 VIEW  Comparison: Portable exam 1240 hours compared to 09/05/2005  Findings: Right jugular line, tip projecting over cavoatrial  junction. Enlargement of cardiac silhouette with pulmonary vascular congestion. Mild left perihilar atelectasis versus infiltrate. Remaining lungs clear. No definite pleural effusion or pneumothorax.  IMPRESSION: Tip of right jugular line projects over cavoatrial junction. Enlargement of cardiac silhouette with pulmonary vascular congestion and mild left perihilar infiltrate versus atelectasis.  Original Report Authenticated By: Lollie Marrow, M.D.   Dg Abd Acute W/chest  01/19/2012  *RADIOLOGY REPORT*  Clinical Data: Right-sided chest and abdominal pain  ACUTE ABDOMEN SERIES (ABDOMEN 2 VIEW & CHEST 1 VIEW)  Comparison: No similar prior study is available for comparison.Lumbar spine radiographs 08/12/2010 are reviewed.  Findings: Moderate hiatal hernia noted.  Cardiac leads obscure detail.  Curvilinear left mid lung zone atelectasis or scarring noted. Low lung volumes with crowding of the bronchovascular markings.  Patchy right lower lobe airspace opacity is noted particularly at the right cardiophrenic angle.  Small right pleural effusion or thickening noted.  No free air beneath the diaphragms. There are multiple air-fluid levels with diffuse mild apparent colonic distention.  No gas filled dilated loop of small bowel is seen.  Mild lumbar spine degenerative change is again identified.  IMPRESSION: Mild colonic prominence and air fluid levels which may suggest ileus.  Ill-defined right cardiophrenic angle airspace opacity could reflect atelectasis or possibly early pneumonia in the appropriate clinical context.  No dilated gas filled loop of small bowel.  Original Report Authenticated By: Harrel Lemon, M.D.    Anti-infectives: Anti-infectives     Start     Dose/Rate Route Frequency Ordered Stop   01/20/12 0900   metroNIDAZOLE (FLAGYL) IVPB 500 mg        500 mg 100 mL/hr over 60 Minutes Intravenous 3 times per day 01/20/12 0815     01/20/12 0600  piperacillin-tazobactam (ZOSYN) IVPB 3.375 g         3.375 g 100 mL/hr over 30 Minutes Intravenous 3 times per day 01/20/12 0024     01/19/12 1830  piperacillin-tazobactam (ZOSYN) IVPB 3.375 g       3.375 g 12.5 mL/hr over 240 Minutes Intravenous  Once 01/19/12 1822 01/19/12 1952          Assessment/Plan: s/p  Advance diet Fleets enema.  In spite of not havng BM for four days, she is being ruled out for C. diff.  On contact precautions.  LOS: 2 days   Marta Lamas. Gae Bon, MD, FACS [phone] [facility] 01/21/2012

## 2012-01-22 ENCOUNTER — Inpatient Hospital Stay (HOSPITAL_COMMUNITY): Payer: Medicare Other

## 2012-01-22 DIAGNOSIS — K5900 Constipation, unspecified: Secondary | ICD-10-CM

## 2012-01-22 DIAGNOSIS — E872 Acidosis: Secondary | ICD-10-CM

## 2012-01-22 DIAGNOSIS — D72829 Elevated white blood cell count, unspecified: Secondary | ICD-10-CM

## 2012-01-22 DIAGNOSIS — E860 Dehydration: Secondary | ICD-10-CM

## 2012-01-22 DIAGNOSIS — A0472 Enterocolitis due to Clostridium difficile, not specified as recurrent: Secondary | ICD-10-CM

## 2012-01-22 LAB — CBC
HCT: 28.8 % — ABNORMAL LOW (ref 36.0–46.0)
MCV: 88.3 fL (ref 78.0–100.0)
RDW: 14.8 % (ref 11.5–15.5)
WBC: 11 10*3/uL — ABNORMAL HIGH (ref 4.0–10.5)

## 2012-01-22 LAB — COMPREHENSIVE METABOLIC PANEL
Albumin: 2.4 g/dL — ABNORMAL LOW (ref 3.5–5.2)
Alkaline Phosphatase: 69 U/L (ref 39–117)
BUN: 20 mg/dL (ref 6–23)
Potassium: 3.5 mEq/L (ref 3.5–5.1)
Sodium: 132 mEq/L — ABNORMAL LOW (ref 135–145)
Total Protein: 5.9 g/dL — ABNORMAL LOW (ref 6.0–8.3)

## 2012-01-22 LAB — GLUCOSE, CAPILLARY
Glucose-Capillary: 120 mg/dL — ABNORMAL HIGH (ref 70–99)
Glucose-Capillary: 106 mg/dL — ABNORMAL HIGH (ref 70–99)

## 2012-01-22 MED ORDER — INSULIN ASPART 100 UNIT/ML ~~LOC~~ SOLN
0.0000 [IU] | Freq: Every day | SUBCUTANEOUS | Status: DC
Start: 2012-01-22 — End: 2012-01-26

## 2012-01-22 MED ORDER — CLOPIDOGREL BISULFATE 75 MG PO TABS
75.0000 mg | ORAL_TABLET | Freq: Every day | ORAL | Status: DC
  Administered 2012-01-22 – 2012-01-26 (×5): 75 mg via ORAL
  Filled 2012-01-22 (×5): qty 1

## 2012-01-22 MED ORDER — INSULIN ASPART 100 UNIT/ML ~~LOC~~ SOLN
0.0000 [IU] | Freq: Three times a day (TID) | SUBCUTANEOUS | Status: DC
  Administered 2012-01-23: 2 [IU] via SUBCUTANEOUS
  Administered 2012-01-23 – 2012-01-25 (×4): 3 [IU] via SUBCUTANEOUS
  Administered 2012-01-25: 5 [IU] via SUBCUTANEOUS
  Administered 2012-01-26 (×2): 3 [IU] via SUBCUTANEOUS

## 2012-01-22 MED ORDER — LORAZEPAM 2 MG/ML IJ SOLN
0.5000 mg | Freq: Once | INTRAMUSCULAR | Status: AC
  Administered 2012-01-22: 0.5 mg via INTRAMUSCULAR
  Filled 2012-01-22 (×2): qty 1

## 2012-01-22 MED ORDER — SODIUM CHLORIDE 0.9 % IV SOLN
INTRAVENOUS | Status: DC
  Administered 2012-01-22: 09:00:00 via INTRAVENOUS

## 2012-01-22 MED ORDER — BIOTENE DRY MOUTH MT LIQD
15.0000 mL | Freq: Two times a day (BID) | OROMUCOSAL | Status: DC
  Administered 2012-01-23 – 2012-01-26 (×7): 15 mL via OROMUCOSAL

## 2012-01-22 MED ORDER — ADULT MULTIVITAMIN W/MINERALS CH
1.0000 | ORAL_TABLET | Freq: Every day | ORAL | Status: DC
  Administered 2012-01-22 – 2012-01-25 (×4): 1 via ORAL
  Filled 2012-01-22 (×5): qty 1

## 2012-01-22 MED ORDER — PANTOPRAZOLE SODIUM 40 MG PO TBEC
40.0000 mg | DELAYED_RELEASE_TABLET | Freq: Every day | ORAL | Status: DC
  Administered 2012-01-22: 40 mg via ORAL
  Filled 2012-01-22: qty 1

## 2012-01-22 MED ORDER — METRONIDAZOLE 500 MG PO TABS
500.0000 mg | ORAL_TABLET | Freq: Three times a day (TID) | ORAL | Status: DC
  Administered 2012-01-22 – 2012-01-23 (×4): 500 mg via ORAL
  Filled 2012-01-22 (×7): qty 1

## 2012-01-22 MED ORDER — OXYBUTYNIN CHLORIDE ER 10 MG PO TB24
10.0000 mg | ORAL_TABLET | Freq: Every day | ORAL | Status: DC
  Administered 2012-01-22 – 2012-01-26 (×5): 10 mg via ORAL
  Filled 2012-01-22 (×5): qty 1

## 2012-01-22 NOTE — Progress Notes (Signed)
[name] Progress Note    Subjective: The patient is C. Diff positive.  No diarrhea, but being treated currently.  Objective: Vital signs in last 24 hours: Temp:  [97.4 F (36.3 C)-99.1 F (37.3 C)] 99.1 F (37.3 C) (05/16 0351) Pulse Rate:  [95-106] 102  (05/16 0500) Resp:  [18-26] 26  (05/16 0351) BP: (123-148)/(67-98) 123/75 mmHg (05/16 0500) SpO2:  [95 %-99 %] 99 % (05/16 0500) Last BM Date: 01/21/12  Intake/Output from previous day: 05/15 0701 - 05/16 0700 In: 2823 [P.O.:920; I.V.:1553; IV Piggyback:350] Out: 1700 [Urine:1700] Intake/Output this shift:    General: No acute distress  Lungs: Clear  Abd: Softer, still mildly distended, but much less tender.  Extremities: No DVT signs or symptoms  Neuro: Intact  Lab Results:  @LABLAST2 (wbc:2,hgb:2,hct:2,plt:2) BMET  Basename 01/22/12 0410 01/21/12 0428  NA 132* 136  K 3.5 3.9  CL 99 104  CO2 20 23  GLUCOSE 198* 209*  BUN 20 34*  CREATININE 1.22* 1.35*  CALCIUM 7.6* 7.5*   PT/INR No results found for this basename: LABPROT:2,INR:2 in the last 72 hours ABG  Basename 01/20/12 0530  PHART 7.300*  HCO3 18.2*    Studies/Results: Dg Chest Port 1 View  01/20/2012  *RADIOLOGY REPORT*  Clinical Data: Right jugular line placement  PORTABLE CHEST - 1 VIEW  Comparison: Portable exam 1240 hours compared to 09/05/2005  Findings: Right jugular line, tip projecting over cavoatrial junction. Enlargement of cardiac silhouette with pulmonary vascular congestion. Mild left perihilar atelectasis versus infiltrate. Remaining lungs clear. No definite pleural effusion or pneumothorax.  IMPRESSION: Tip of right jugular line projects over cavoatrial junction. Enlargement of cardiac silhouette with pulmonary vascular congestion and mild left perihilar infiltrate versus atelectasis.  Original Report Authenticated By: Lollie Marrow, M.D.    Anti-infectives: Anti-infectives     Start     Dose/Rate Route Frequency Ordered Stop   01/21/12 2200   metroNIDAZOLE (FLAGYL) IVPB 500 mg        500 mg 100 mL/hr over 60 Minutes Intravenous Every 8 hours 01/21/12 1718 02/04/12 2159   01/21/12 1800   vancomycin (VANCOCIN) 50 mg/mL oral solution 500 mg        500 mg Oral 4 times per day 01/21/12 1718 02/04/12 1759   01/21/12 1400  piperacillin-tazobactam (ZOSYN) IVPB 3.375 g       3.375 g 12.5 mL/hr over 240 Minutes Intravenous 3 times per day 01/21/12 1123     01/20/12 0900   metroNIDAZOLE (FLAGYL) IVPB 500 mg  Status:  Discontinued        500 mg 100 mL/hr over 60 Minutes Intravenous 3 times per day 01/20/12 0815 01/21/12 1501   01/20/12 0600   piperacillin-tazobactam (ZOSYN) IVPB 3.375 g  Status:  Discontinued        3.375 g 100 mL/hr over 30 Minutes Intravenous 3 times per day 01/20/12 0024 01/21/12 1123   01/19/12 1830  piperacillin-tazobactam (ZOSYN) IVPB 3.375 g       3.375 g 12.5 mL/hr over 240 Minutes Intravenous  Once 01/19/12 1822 01/19/12 1952          Assessment/Plan: s/p  Advance diet Would consider adding oral vancomycin. Patient is now having diarrhea. No need for surgery at this point. Will sign off.  LOS: 3 days   Marta Lamas. Gae Bon, MD, FACS [phone] [facility] Surgery 01/22/2012

## 2012-01-22 NOTE — Progress Notes (Signed)
Pt to be transferred to unit 4500, report given to receiving nurse-Carol.  Pt educated on transfer and she verbalized her understanding of teaching.  Pt become SOB when moving from chair to bed, so pt will be transported via bed, stable off monitor. 01/22/2012 11:49 AM Dahmir Epperly, [place] Sink

## 2012-01-22 NOTE — Progress Notes (Signed)
Informed that pt pulled out RIJ, catheter intact, while ambulating alone to bathroom. Lrge Bm. Small blood noted by RN's. Site drsed. 22G placed in right thumb with D5NS +20K @ 77ml/hr. Pt verbalized understanding of using call light. Bed alarm in place.  Donnamarie Poag, NP notified via text page.

## 2012-01-22 NOTE — Progress Notes (Signed)
Patient has not voided since foley removed at 1000 today.  Bladder scan done with 211 ml  urine in bladder.  Patient states does not want In and out catheter at this time.  Patient has no complaints of bladder fullness, urgency or discomfort.  Will monitor.  Courtney Paris RN

## 2012-01-22 NOTE — Progress Notes (Signed)
[facility] [place] TEAM 1 - Stepdown/ICU TEAM  PCP:  Eino Farber, MD, MD  Interim history: 78 y.o. female with history of diabetes, hypertension, hypothyroidism, hypercholesterolemia, presents to [facility] emergency room with 2 days history of abdominal discomfort, nausea, vomiting food, but no fever, chills, black stool bloody stool. She denied any chest pain, but admitted to have mild shortness of breath. Evaluation in emergency room included leukocytosis with white count of 25.6 thousand, blood glucose of 294, elevated creatinine to 1.73, lactic acid of 4.6, troponin of 0.3 bicarbonate of 21, normal liver function tests, and potassium of 3.0. Her abdominal plain film shows air-fluid levels consistent with ileus, and a right patchy infiltrate in the lung. Abdominal CT confirmed air fluid level which could be consistent with an ileus.   Subjective: Much more alert and currently denies any abdominal pain. She is actually very hungry. Her NG tube has been removed by the surgical team. She is passing bowel movements. These are loose. She is out of bed to the chair.  Procedures CVL 5/14>>>  Objective:  Intake/Output Summary (Last 24 hours) at 01/22/12 1049 Last data filed at 01/22/12 1000  Gross per 24 hour  Intake   2910 ml  Output   1350 ml  Net   1560 ml   Blood pressure 111/88, pulse 100, temperature 98.3 F (36.8 C), temperature source Oral, resp. rate 24, height 5\' 6"  (1.676 m), weight 79.5 kg (175 lb 4.3 oz), SpO2 98.00%.  CBG (last 3)   Basename 01/20/12 2120 01/20/12 1644 01/19/12 2357  GLUCAP 189* 184* 151*   Physical Exam: General: mildly tachypneic at rest, but able to carry on a conversation Lungs: mild bibasilar crackles - no wheeze  Cardiovascular: Regular rate and rhythm without murmur gallop or rub  Abdomen: mildly tender to deep palpation diffusely - no appreciable mass - no rebound, bs+ Extremities: No significant cyanosis, clubbing, or edema  bilateral lower extremities  Lab Results:  [facility] 01/22/12 0410 01/21/12 0428 01/20/12 1215  NA 132* 136 136  K 3.5 3.9 4.0  CL 99 104 102  CO2 20 23 20   GLUCOSE 198* 209* 180*  BUN 20 34* 47*  CREATININE 1.22* 1.35* 1.49*  CALCIUM 7.6* 7.5* 7.7*  MG -- -- --  PHOS -- -- --    Basename 01/22/12 0410 01/19/12 1426  AST 19 24  ALT 14 21  ALKPHOS 69 104  BILITOT 0.7 0.4  PROT 5.9* 8.6*  ALBUMIN 2.4* 3.8    Basename 01/22/12 0410 01/21/12 0428 01/20/12 0550 01/19/12 1426  WBC 11.0* 13.1* 22.4* --  NEUTROABS -- -- -- 23.8*  HGB 10.2* 9.8* 11.1* --  HCT 28.8* 29.3* 33.6* --  MCV 88.3 89.1 91.3 --  PLT 162 174 198 --    Basename 01/20/12 1215 01/20/12 0129 01/19/12 1544  CKTOTAL 381* 264* --  CKMB 9.1* 8.3* --  CKMBINDEX -- -- --  TROPONINI 0.34* 0.39* 0.31*    Micro Results: Recent Results (from the past 240 hour(s))  URINE CULTURE     Status: Normal (Preliminary result)   Collection Time   01/19/12  3:42 PM      Component Value Range Status Comment   Specimen Description URINE, CATHETERIZED   Final    Special Requests NONE   Final    Culture  Setup Time [id number]   Final    Colony Count >=100,000 COLONIES/ML   Final    Culture ENTEROCOCCUS SPECIES   Final    Report  Status PENDING   Incomplete   CULTURE, BLOOD (ROUTINE X 2)     Status: Normal (Preliminary result)   Collection Time   01/19/12  7:00 PM      Component Value Range Status Comment   Specimen Description BLOOD LEFT HAND   Final    Special Requests BOTTLES DRAWN AEROBIC AND ANAEROBIC 6CC   Final    Culture NO GROWTH 2 DAYS   Final    Report Status PENDING   Incomplete   CULTURE, BLOOD (ROUTINE X 2)     Status: Normal (Preliminary result)   Collection Time   01/19/12  7:10 PM      Component Value Range Status Comment   Specimen Description BLOOD RIGHT HAND   Final    Special Requests BOTTLES DRAWN AEROBIC AND ANAEROBIC 6CC   Final    Culture NO GROWTH 2 DAYS   Final    Report Status PENDING    Incomplete   MRSA PCR SCREENING     Status: Normal   Collection Time   01/19/12 11:09 PM      Component Value Range Status Comment   MRSA by PCR NEGATIVE  NEGATIVE  Final   CLOSTRIDIUM DIFFICILE BY PCR     Status: Abnormal   Collection Time   01/21/12 12:19 PM      Component Value Range Status Comment   C difficile by pcr POSITIVE (*) NEGATIVE  Final     Studies/Results: All recent x-ray/radiology reports have been reviewed in detail.   Medications: I have reviewed the patient's complete medication list.  Assessment/Plan:  Diffuse peritonitis with megacolon on the right and transverse colon Clinically has improved markedly -NG tube now out and no evidence of acute abdomen nor any indications for surgery this admission-symptoms likely related to acute C. difficile colitis-surgery has written for advancement in diet  Lactic acidosis / Possible sepsis Resolved-procalcitonin normal - lactic acid now normalized - serum bicarb normalized - resolved-clinical picture was more consistent with dehydration  Enterococcus urinary tract infect Continue Zosyn-followup on culture-resume home Ditropan  Acute renal failure crt peaked at 1.56 - improving with volume expansion - cont IVF support until normalized-appears to still be volume depleted despite improvement in creatinine, urine remains dark so we'll continue IV fluids at 50 cc per hour  Acute resp failure w/ BLL ASD On empiric abx coverage - WBC improving - f/uc CXR in AM  +C diff PCR Change IV Flagyl to oral and continue oral vancomycin-now has started having looser stools   Diabetes Change maintenance fluid to normal saline-begin CBG checks with sliding scale insulin-diet controlled at home  Hypokalemia Resolved  Hypertension Home Diovan remains on hold due to recent acute renal failure and current soft blood pressure readings  Grade 1 diastolic dysfunction Was on Lasix at home-as remains on hold due to acute renal failure and  soft blood pressure readings  Hypothyroidism Cont replacment tx-TSH 9-check free T4 and T3  Elevated troponin Likely mild stress induced/hypovelmia induced ischemia - no chest pain - echo reveals no focal WMA and normal EF-was on Plavix prior to admission but no reported history of CAD or stroke, we'll need to clarify with the patient and/or family  Disposition Transfer to medical floor  Junious Silk, ANP [facility] Office  [phone] Pager [phone]  On-Call/Text Page:      [email or web address]      password [email or web address]  I have personally examined the patient and reviewed the entire database. Agree with the above note  and plan as outlined by Ms. Rennis Harding.  Pierrette Scheu M.D. [facility] 01/22/2012, 2:23 PM

## 2012-01-23 ENCOUNTER — Inpatient Hospital Stay (HOSPITAL_COMMUNITY): Payer: Medicare Other

## 2012-01-23 ENCOUNTER — Encounter (HOSPITAL_COMMUNITY): Payer: Self-pay

## 2012-01-23 DIAGNOSIS — D72829 Elevated white blood cell count, unspecified: Secondary | ICD-10-CM

## 2012-01-23 DIAGNOSIS — J189 Pneumonia, unspecified organism: Secondary | ICD-10-CM

## 2012-01-23 DIAGNOSIS — E872 Acidosis: Secondary | ICD-10-CM

## 2012-01-23 DIAGNOSIS — K5900 Constipation, unspecified: Secondary | ICD-10-CM

## 2012-01-23 DIAGNOSIS — R197 Diarrhea, unspecified: Secondary | ICD-10-CM

## 2012-01-23 DIAGNOSIS — A0472 Enterocolitis due to Clostridium difficile, not specified as recurrent: Secondary | ICD-10-CM

## 2012-01-23 LAB — SODIUM, URINE, RANDOM: Sodium, Ur: <10 mEq/L

## 2012-01-23 LAB — T3: T3, Total: 43.5 ng/dl — ABNORMAL LOW (ref 80.0–204.0)

## 2012-01-23 LAB — OSMOLALITY: Osmolality: 272 mOsm/kg — ABNORMAL LOW (ref 275–300)

## 2012-01-23 LAB — BASIC METABOLIC PANEL
CO2: 18 mEq/L — ABNORMAL LOW (ref 19–32)
GFR calc non Af Amer: 36 mL/min — ABNORMAL LOW (ref >90)
Glucose, Bld: 128 mg/dL — ABNORMAL HIGH (ref 70–99)
Potassium: 3.6 mEq/L (ref 3.5–5.1)
Sodium: 127 mEq/L — ABNORMAL LOW (ref 135–145)

## 2012-01-23 LAB — URINE CULTURE: Colony Count: >=100000

## 2012-01-23 LAB — GLUCOSE, CAPILLARY
Glucose-Capillary: 151 mg/dL — ABNORMAL HIGH (ref 70–99)
Glucose-Capillary: 168 mg/dL — ABNORMAL HIGH (ref 70–99)

## 2012-01-23 LAB — T4, FREE: Free T4: 0.99 ng/dL (ref 0.80–1.80)

## 2012-01-23 LAB — CBC
Hemoglobin: 10.1 g/dL — ABNORMAL LOW (ref 12.0–15.0)
MCHC: 34 g/dL (ref 30.0–36.0)
Platelets: 191 10*3/uL (ref 150–400)
RBC: 3.42 MIL/uL — ABNORMAL LOW (ref 3.87–5.11)

## 2012-01-23 LAB — PRO B NATRIURETIC PEPTIDE: Pro B Natriuretic peptide (BNP): 3136 pg/mL — ABNORMAL HIGH (ref 0–450)

## 2012-01-23 MED ORDER — METRONIDAZOLE IN NACL 5-0.79 MG/ML-% IV SOLN
500.0000 mg | Freq: Three times a day (TID) | INTRAVENOUS | Status: DC
  Administered 2012-01-23 – 2012-01-24 (×5): 500 mg via INTRAVENOUS
  Filled 2012-01-23 (×8): qty 100

## 2012-01-23 MED ORDER — ACETAMINOPHEN 325 MG PO TABS
650.0000 mg | ORAL_TABLET | ORAL | Status: DC | PRN
  Administered 2012-01-25: 650 mg via ORAL
  Filled 2012-01-23: qty 2

## 2012-01-23 MED ORDER — LORAZEPAM 0.5 MG PO TABS
0.5000 mg | ORAL_TABLET | Freq: Three times a day (TID) | ORAL | Status: DC | PRN
  Administered 2012-01-23 – 2012-01-25 (×2): 0.5 mg via ORAL
  Filled 2012-01-23 (×2): qty 1

## 2012-01-23 MED ORDER — VANCOMYCIN 50 MG/ML ORAL SOLUTION
500.0000 mg | Freq: Four times a day (QID) | ORAL | Status: DC
  Administered 2012-01-23 – 2012-01-25 (×8): 500 mg via ORAL
  Filled 2012-01-23 (×12): qty 10

## 2012-01-23 MED ORDER — FAMOTIDINE 40 MG/5ML PO SUSR
40.0000 mg | Freq: Every day | ORAL | Status: DC | PRN
  Administered 2012-01-24: 40 mg via ORAL
  Filled 2012-01-23 (×2): qty 5

## 2012-01-23 MED ORDER — SODIUM CHLORIDE 0.9 % IR SOLN
500.0000 mg | Freq: Four times a day (QID) | Status: DC
  Administered 2012-01-23 – 2012-01-25 (×6): 500 mg via RECTAL
  Filled 2012-01-23 (×16): qty 500

## 2012-01-23 MED ORDER — METOPROLOL SUCCINATE ER 50 MG PO TB24
50.0000 mg | ORAL_TABLET | Freq: Two times a day (BID) | ORAL | Status: DC
  Administered 2012-01-23 – 2012-01-26 (×6): 50 mg via ORAL
  Filled 2012-01-23 (×7): qty 1

## 2012-01-23 NOTE — Progress Notes (Signed)
Patient had not voided since foley removed at 1000 on 01-21-12.  Bladder scan done with >600 ml of urine in bladder.  An in and out catheterization was done and 525 ml of urine removed. Patient tolerated procedure well.

## 2012-01-23 NOTE — Consult Note (Addendum)
Date of Admission:  01/19/2012  Date of Consult:  01/23/2012  Reason for Consult: C difficile coliits w  Ileus andconcern for toxic megacolon, and ? PNA Referring Physician: Dr. Blake Divine   HPI: [name] [name] is an 78 y.o. female with PMHx significant for DM, HTN, PUD on chronic PPI, who presented with  2 days history of abdominal discomfort, nausea, vomiting food abdominal distention to [facility]. She had significant elevated WBC, lactic acidosis, with CT showing ileus pattern. She was started on broad spectrum antibiotics including vancomycin, zosyn, flagyl, found to have C  Diff PCR positive. She has not had many bowel movements and recently had reportedly formed stool but in context of ileus and megacolon there was VERY appropriate concern for clostridium difficile colitis as the cause for her pathology and she was started on oral vancomycin. Her urine culture on admission is growing enterococcus but she does not appear to have much urinary symptoms other than difficulty voiding. Today her CXR has shown worsening bilateral infiltrated with concern for pneumonia, though she is 1.7L positive since admission and is obviously third spacing with new onset lower extremity edema making me think this could instead be pulmonary edema.    Past Medical History  Diagnosis Date  . Diabetes mellitus   . Hypertension     History reviewed. No pertinent past surgical history.ergies:   No Known Allergies   Medications: I have reviewed patients current medications as documented in Epic Anti-infectives     Start     Dose/Rate Route Frequency Ordered Stop   01/23/12 1300   vancomycin (VANCOCIN) 50 mg/mL oral solution 500 mg        500 mg Oral 4 times per day 01/23/12 1257 02/06/12 1159   01/23/12 1300   metroNIDAZOLE (FLAGYL) IVPB 500 mg        500 mg 100 mL/hr over 60 Minutes Intravenous Every 8 hours 01/23/12 1257 02/06/12 1259   01/23/12 1300   vancomycin (VANCOCIN) 500 mg in sodium chloride irrigation 0.9  % 100 mL ENEMA        500 mg Rectal 4 times per day 01/23/12 1257 02/06/12 1159   01/22/12 0930   metroNIDAZOLE (FLAGYL) tablet 500 mg  Status:  Discontinued        500 mg Oral 3 times per day 01/22/12 0813 01/23/12 1209   01/21/12 2200   metroNIDAZOLE (FLAGYL) IVPB 500 mg  Status:  Discontinued        500 mg 100 mL/hr over 60 Minutes Intravenous Every 8 hours 01/21/12 1718 01/22/12 0813   01/21/12 1800   vancomycin (VANCOCIN) 50 mg/mL oral solution 500 mg  Status:  Discontinued        500 mg Oral 4 times per day 01/21/12 1718 01/23/12 1257   01/21/12 1400   piperacillin-tazobactam (ZOSYN) IVPB 3.375 g  Status:  Discontinued        3.375 g 12.5 mL/hr over 240 Minutes Intravenous 3 times per day 01/21/12 1123 01/23/12 1209   01/20/12 0900   metroNIDAZOLE (FLAGYL) IVPB 500 mg  Status:  Discontinued        500 mg 100 mL/hr over 60 Minutes Intravenous 3 times per day 01/20/12 0815 01/21/12 1501   01/20/12 0600   piperacillin-tazobactam (ZOSYN) IVPB 3.375 g  Status:  Discontinued        3.375 g 100 mL/hr over 30 Minutes Intravenous 3 times per day 01/20/12 0024 01/21/12 1123   01/19/12 1830  piperacillin-tazobactam (ZOSYN) IVPB 3.375 g  3.375 g 12.5 mL/hr over 240 Minutes Intravenous  Once 01/19/12 1822 01/19/12 1952          Social History:  reports that she has never smoked. She does not have any smokeless tobacco history on file. She reports that she does not drink alcohol or use illicit drugs.  History reviewed. No pertinent family history.  As in HPI and primary teams notes otherwise 12 point review of systems is negative  Blood pressure 174/92, pulse 110, temperature 97 F (36.1 C), temperature source Oral, resp. rate 18, height 5\' 6"  (1.676 m), weight 181 lb 10.5 oz (82.4 kg), SpO2 90.00%.  General: Alert and awake, oriented x3, not in any acute distress. HEENT: anicteric sclera, pupils reactive to light and accommodation, EOMI, oropharynx clear and without  exudate CVS tachycardic, normalr,  no murmur rubs or gallops Chest: clear to auscultation bilaterally, no wheezing, rales or rhonchi Abdomen:distended and diffusely tender to palpation Extremities: 3+ edema Skin: no rashes Neuro: nonfocal, strength and sensation intact   Results for orders placed during the hospital encounter of 01/19/12 (from the past 48 hour(s))  COMPREHENSIVE METABOLIC PANEL     Status: Abnormal   Collection Time   01/22/12  4:10 AM      Component Value Range Comment   Sodium 132 (*) 135 - 145 (mEq/L)    Potassium 3.5  3.5 - 5.1 (mEq/L)    Chloride 99  96 - 112 (mEq/L)    CO2 20  19 - 32 (mEq/L)    Glucose, Bld 198 (*) 70 - 99 (mg/dL)    BUN 20  6 - 23 (mg/dL)    Creatinine, Ser 1.61 (*) 0.50 - 1.10 (mg/dL)    Calcium 7.6 (*) 8.4 - 10.5 (mg/dL)    Total Protein 5.9 (*) 6.0 - 8.3 (g/dL)    Albumin 2.4 (*) 3.5 - 5.2 (g/dL)    AST 19  0 - 37 (U/L)    ALT 14  0 - 35 (U/L)    Alkaline Phosphatase 69  39 - 117 (U/L)    Total Bilirubin 0.7  0.3 - 1.2 (mg/dL)    GFR calc non Af Amer 41 (*) >90 (mL/min)    GFR calc Af Amer 48 (*) >90 (mL/min)   CBC     Status: Abnormal   Collection Time   01/22/12  4:10 AM      Component Value Range Comment   WBC 11.0 (*) 4.0 - 10.5 (K/uL)    RBC 3.26 (*) 3.87 - 5.11 (MIL/uL)    Hemoglobin 10.2 (*) 12.0 - 15.0 (g/dL)    HCT 09.6 (*) 04.5 - 46.0 (%)    MCV 88.3  78.0 - 100.0 (fL)    MCH 31.3  26.0 - 34.0 (pg)    MCHC 35.4  30.0 - 36.0 (g/dL)    RDW 40.9  81.1 - 91.4 (%)    Platelets 162  150 - 400 (K/uL)   GLUCOSE, CAPILLARY     Status: Abnormal   Collection Time   01/22/12  8:52 AM      Component Value Range Comment   Glucose-Capillary 170 (*) 70 - 99 (mg/dL)   GLUCOSE, CAPILLARY     Status: Abnormal   Collection Time   01/22/12 12:34 PM      Component Value Range Comment   Glucose-Capillary 152 (*) 70 - 99 (mg/dL)    Comment 1 Notify RN     GLUCOSE, CAPILLARY     Status: Abnormal   Collection  Time   01/22/12  5:16 PM       Component Value Range Comment   Glucose-Capillary 120 (*) 70 - 99 (mg/dL)    Comment 1 Notify RN     GLUCOSE, CAPILLARY     Status: Abnormal   Collection Time   01/22/12  9:52 PM      Component Value Range Comment   Glucose-Capillary 106 (*) 70 - 99 (mg/dL)   T4, FREE     Status: Normal   Collection Time   01/23/12  6:45 AM      Component Value Range Comment   Free T4 0.99  0.80 - 1.80 (ng/dL)   T3     Status: Abnormal   Collection Time   01/23/12  6:45 AM      Component Value Range Comment   T3, Total 43.5 (*) 80.0 - 204.0 (ng/dl)   CBC     Status: Abnormal   Collection Time   01/23/12  6:45 AM      Component Value Range Comment   WBC 14.7 (*) 4.0 - 10.5 (K/uL)    RBC 3.42 (*) 3.87 - 5.11 (MIL/uL)    Hemoglobin 10.1 (*) 12.0 - 15.0 (g/dL)    HCT 14.7 (*) 82.9 - 46.0 (%)    MCV 86.8  78.0 - 100.0 (fL)    MCH 29.5  26.0 - 34.0 (pg)    MCHC 34.0  30.0 - 36.0 (g/dL)    RDW 56.2  13.0 - 86.5 (%)    Platelets 191  150 - 400 (K/uL)   BASIC METABOLIC PANEL     Status: Abnormal   Collection Time   01/23/12  6:45 AM      Component Value Range Comment   Sodium 127 (*) 135 - 145 (mEq/L)    Potassium 3.6  3.5 - 5.1 (mEq/L)    Chloride 94 (*) 96 - 112 (mEq/L)    CO2 18 (*) 19 - 32 (mEq/L)    Glucose, Bld 128 (*) 70 - 99 (mg/dL)    BUN 20  6 - 23 (mg/dL)    Creatinine, Ser 7.84 (*) 0.50 - 1.10 (mg/dL)    Calcium 8.0 (*) 8.4 - 10.5 (mg/dL)    GFR calc non Af Amer 36 (*) >90 (mL/min)    GFR calc Af Amer 41 (*) >90 (mL/min)   GLUCOSE, CAPILLARY     Status: Abnormal   Collection Time   01/23/12  8:18 AM      Component Value Range Comment   Glucose-Capillary 151 (*) 70 - 99 (mg/dL)    Comment 1 Notify RN     GLUCOSE, CAPILLARY     Status: Abnormal   Collection Time   01/23/12 12:02 PM      Component Value Range Comment   Glucose-Capillary 151 (*) 70 - 99 (mg/dL)    Comment 1 Notify RN         Component Value Date/Time   SDES BLOOD RIGHT HAND 01/19/2012 1910   SPECREQUEST BOTTLES  DRAWN AEROBIC AND ANAEROBIC 6CC 01/19/2012 1910   CULT NO GROWTH 3 DAYS 01/19/2012 1910   REPTSTATUS PENDING 01/19/2012 1910   Dg Chest Port 1 View  01/22/2012  *RADIOLOGY REPORT*  Clinical Data: Follow up infiltrate.  PORTABLE CHEST - 1 VIEW  Comparison: 01/20/2012  Findings: Worsening bilateral airspace disease, diffuse throughout the left lung and most confluent in the right upper lobe.  Suspect pneumonia.  Cardiomegaly with vascular congestion.  No visible effusions.  IMPRESSION: Worsening bilateral  airspace disease, left greater than right, presumably infection.  Original Report Authenticated By: Cyndie Chime, M.D.     Recent Results (from the past 720 hour(s))  URINE CULTURE     Status: Normal (Preliminary result)   Collection Time   01/19/12  3:42 PM      Component Value Range Status Comment   Specimen Description URINE, CATHETERIZED   Final    Special Requests NONE   Final    Culture  Setup Time [id number]   Final    Colony Count >=100,000 COLONIES/ML   Final    Culture ENTEROCOCCUS SPECIES   Final    Report Status PENDING   Incomplete   CULTURE, BLOOD (ROUTINE X 2)     Status: Normal (Preliminary result)   Collection Time   01/19/12  7:00 PM      Component Value Range Status Comment   Specimen Description BLOOD LEFT HAND   Final    Special Requests BOTTLES DRAWN AEROBIC AND ANAEROBIC 6CC   Final    Culture NO GROWTH 3 DAYS   Final    Report Status PENDING   Incomplete   CULTURE, BLOOD (ROUTINE X 2)     Status: Normal (Preliminary result)   Collection Time   01/19/12  7:10 PM      Component Value Range Status Comment   Specimen Description BLOOD RIGHT HAND   Final    Special Requests BOTTLES DRAWN AEROBIC AND ANAEROBIC 6CC   Final    Culture NO GROWTH 3 DAYS   Final    Report Status PENDING   Incomplete   MRSA PCR SCREENING     Status: Normal   Collection Time   01/19/12 11:09 PM      Component Value Range Status Comment   MRSA by PCR NEGATIVE  NEGATIVE  Final     CLOSTRIDIUM DIFFICILE BY PCR     Status: Abnormal   Collection Time   01/21/12 12:19 PM      Component Value Range Status Comment   C difficile by pcr POSITIVE (*) NEGATIVE  Final      Impression/Recommendation  79 year old with severe CDI with ileus and concern for toxic megacolon, with enterococcus in urine and pulmonary infiltrates  1) Severe CDI: --engage Cdiff order set with oral high dose vancomycin, IV flagyl and rectal vancomycin --dc other antibiotics unless clear need for them --dc ppi  2) Enterococcus in urine: liklely colonizer rather than pathogen  3) Bilateral pulmonary infiltrates: in context of pt being fluid positive third spacing i think this is pulmonary edema.  --check BNP, recheck CXR in the am --would consider giving her IV lasix (she looks like she could tolerate this from BP standpoint) --if there becomes sufficient concern for aspiration PNA/HCAp would rx with tygacil  Dr. Drue Second will be covering this weekend and is available for questions.    Thank you so much for this interesting consult,   Acey Lav 01/23/2012, 1:19 PM   [phone] (pager) [phone] (office)

## 2012-01-23 NOTE — Progress Notes (Signed)
Around  2300 the patient pulled out her IV and was very agitated and confused.   She is refusing to take her medications and says she takes them in the morning and will not take any more tonight.   I notified the nurse practitioner on call and received a one time order for Ativan, which I was able to give IM.   Also her sitter was changed from a female to a female, since she did not want the female sitter to help her.  Will continue to monitor.

## 2012-01-23 NOTE — Progress Notes (Signed)
Subjective: Comfortable, looking forward to going home.   Objective: Weight change:   Intake/Output Summary (Last 24 hours) at 01/23/12 1157 Last data filed at 01/23/12 0830  Gross per 24 hour  Intake    880 ml  Output    550 ml  Net    330 ml    Filed Vitals:   01/23/12 1046  BP: 124/71  Pulse: 101  Temp:   Resp:    Physical Exam:  General: mildly tachypneic at rest, but able to carry on a conversation  Lungs: mild bibasilar crackles - no wheeze  Cardiovascular: Regular rate and rhythm without murmur gallop or rub  Abdomen: mildly tender to deep palpation diffusely - no appreciable mass - no rebound, bs+  Extremities: No significant cyanosis, clubbing, or edema bilateral lower extremities   Lab Results: Results for orders placed during the hospital encounter of 01/19/12 (from the past 24 hour(s))  GLUCOSE, CAPILLARY     Status: Abnormal   Collection Time   01/22/12  9:52 PM      Component Value Range   Glucose-Capillary 106 (*) 70 - 99 (mg/dL)  HEMOGLOBIN Z6X     Status: Abnormal   Collection Time   01/23/12  6:45 AM      Component Value Range   Hemoglobin A1C 7.1 (*) <5.7 (%)   Mean Plasma Glucose 157 (*) <117 (mg/dL)  T4, FREE     Status: Normal   Collection Time   01/23/12  6:45 AM      Component Value Range   Free T4 0.99  0.80 - 1.80 (ng/dL)  T3     Status: Abnormal   Collection Time   01/23/12  6:45 AM      Component Value Range   T3, Total 43.5 (*) 80.0 - 204.0 (ng/dl)  CBC     Status: Abnormal   Collection Time   01/23/12  6:45 AM      Component Value Range   WBC 14.7 (*) 4.0 - 10.5 (K/uL)   RBC 3.42 (*) 3.87 - 5.11 (MIL/uL)   Hemoglobin 10.1 (*) 12.0 - 15.0 (g/dL)   HCT 09.6 (*) 04.5 - 46.0 (%)   MCV 86.8  78.0 - 100.0 (fL)   MCH 29.5  26.0 - 34.0 (pg)   MCHC 34.0  30.0 - 36.0 (g/dL)   RDW 40.9  81.1 - 91.4 (%)   Platelets 191  150 - 400 (K/uL)  BASIC METABOLIC PANEL     Status: Abnormal   Collection Time   01/23/12  6:45 AM      Component Value  Range   Sodium 127 (*) 135 - 145 (mEq/L)   Potassium 3.6  3.5 - 5.1 (mEq/L)   Chloride 94 (*) 96 - 112 (mEq/L)   CO2 18 (*) 19 - 32 (mEq/L)   Glucose, Bld 128 (*) 70 - 99 (mg/dL)   BUN 20  6 - 23 (mg/dL)   Creatinine, Ser 7.82 (*) 0.50 - 1.10 (mg/dL)   Calcium 8.0 (*) 8.4 - 10.5 (mg/dL)   GFR calc non Af Amer 36 (*) >90 (mL/min)   GFR calc Af Amer 41 (*) >90 (mL/min)  GLUCOSE, CAPILLARY     Status: Abnormal   Collection Time   01/23/12  8:18 AM      Component Value Range   Glucose-Capillary 151 (*) 70 - 99 (mg/dL)   Comment 1 Notify RN    GLUCOSE, CAPILLARY     Status: Abnormal   Collection Time  01/23/12 12:02 PM      Component Value Range   Glucose-Capillary 151 (*) 70 - 99 (mg/dL)   Comment 1 Notify RN    TSH     Status: Abnormal   Collection Time   01/23/12  1:10 PM      Component Value Range   TSH 7.683 (*) 0.350 - 4.500 (uIU/mL)  OSMOLALITY     Status: Abnormal   Collection Time   01/23/12  1:10 PM      Component Value Range   Osmolality 272 (*) 275 - 300 (mOsm/kg)  PRO B NATRIURETIC PEPTIDE     Status: Abnormal   Collection Time   01/23/12  1:11 PM      Component Value Range   Pro B Natriuretic peptide (BNP) 3136.0 (*) 0 - 450 (pg/mL)  OSMOLALITY, URINE     Status: Abnormal   Collection Time   01/23/12  4:46 PM      Component Value Range   Osmolality, Ur 380 (*) 390 - 1090 (mOsm/kg)  SODIUM, URINE, RANDOM     Status: Normal   Collection Time   01/23/12  4:46 PM      Component Value Range   Sodium, Ur <10    GLUCOSE, CAPILLARY     Status: Abnormal   Collection Time   01/23/12  5:14 PM      Component Value Range   Glucose-Capillary 168 (*) 70 - 99 (mg/dL)   Comment 1 Notify RN       Micro Results: Recent Results (from the past 240 hour(s))  URINE CULTURE     Status: Normal (Preliminary result)   Collection Time   01/19/12  3:42 PM      Component Value Range Status Comment   Specimen Description URINE, CATHETERIZED   Final    Special Requests NONE   Final      Culture  Setup Time [id number]   Final    Colony Count >=100,000 COLONIES/ML   Final    Culture ENTEROCOCCUS SPECIES   Final    Report Status PENDING   Incomplete   CULTURE, BLOOD (ROUTINE X 2)     Status: Normal (Preliminary result)   Collection Time   01/19/12  7:00 PM      Component Value Range Status Comment   Specimen Description BLOOD LEFT HAND   Final    Special Requests BOTTLES DRAWN AEROBIC AND ANAEROBIC 6CC   Final    Culture NO GROWTH 3 DAYS   Final    Report Status PENDING   Incomplete   CULTURE, BLOOD (ROUTINE X 2)     Status: Normal (Preliminary result)   Collection Time   01/19/12  7:10 PM      Component Value Range Status Comment   Specimen Description BLOOD RIGHT HAND   Final    Special Requests BOTTLES DRAWN AEROBIC AND ANAEROBIC 6CC   Final    Culture NO GROWTH 3 DAYS   Final    Report Status PENDING   Incomplete   MRSA PCR SCREENING     Status: Normal   Collection Time   01/19/12 11:09 PM      Component Value Range Status Comment   MRSA by PCR NEGATIVE  NEGATIVE  Final   CLOSTRIDIUM DIFFICILE BY PCR     Status: Abnormal   Collection Time   01/21/12 12:19 PM      Component Value Range Status Comment   C difficile by pcr POSITIVE (*) NEGATIVE  Final  Studies/Results: Ct Abdomen Pelvis W Contrast  01/19/2012  *RADIOLOGY REPORT*  Clinical Data: Abdominal pain  CT ABDOMEN AND PELVIS WITH CONTRAST  Technique:  Multidetector CT imaging of the abdomen and pelvis was performed following the standard protocol during bolus administration of intravenous contrast.  Contrast: OMNIPAQUE IOHEXOL 300 MG/ML  SOLN  Comparison: 04/18/2009  Findings: Large hiatal hernia again noted.  New areas of wedge- shaped bilateral lower lobe probable atelectasis noted with mild bronchial wall thickening.  Incompletely visualized lingular patchy airspace opacity predominately on the first image of the study is noted.  Liver, gallbladder, adrenal glands, spleen, and pancreas are normal.   Mild renal cortical thinning noted bilaterally.  Imaging through the kidneys is minimally degraded by motion artifact but no cortical lesion is identified.  No hydronephrosis.  No radiopaque renal or ureteral calculus allowing for technique.  The kidneys symmetrically and bilaterally enhance and excrete contrast into nondilated renal collecting systems.  Moderate gas and fluid noted throughout the colon with gradual decompression at the level of the mid descending colon to decompressed redundant sigmoid colon.  No mass lesion, bowel wall thickening, or dilatation is otherwise identified.  Small bowel is decompressed.  Appendix is normal.  Mild atherosclerotic aortic calcification without aneurysm.  Lobulated uterine contour again noted, likely due to fibroids. Right ovary is normal.  Left ovary not identified but no adnexal mass is seen on the left.  No pelvic free fluid or lymphadenopathy. Bladder is normal.  Degenerative changes are noted in the spine.  No acute osseous finding.  Minimal lower thoracic central vertebral body height loss noted without overt compression deformity.  IMPRESSION: Proximal and mid colonic prominence with air fluid levels but gradual decompression to a normal caliber decompressed sigmoid colon.  Localized ileus or non visualized adhesion/mass lesion are within the differential diagnosis.  The pattern is not typical for volvulus given the gradual decompression of the colon.  Incompletely imaged lingular and bilateral lower lobe airspace opacities.  Atelectasis or early pneumonia could have this appearance.  Original Report Authenticated By: Harrel Lemon, M.D.   Dg Chest Port 1 View  01/22/2012  *RADIOLOGY REPORT*  Clinical Data: Follow up infiltrate.  PORTABLE CHEST - 1 VIEW  Comparison: 01/20/2012  Findings: Worsening bilateral airspace disease, diffuse throughout the left lung and most confluent in the right upper lobe.  Suspect pneumonia.  Cardiomegaly with vascular  congestion.  No visible effusions.  IMPRESSION: Worsening bilateral airspace disease, left greater than right, presumably infection.  Original Report Authenticated By: Cyndie Chime, M.D.   Dg Chest Port 1 View  01/20/2012  *RADIOLOGY REPORT*  Clinical Data: Right jugular line placement  PORTABLE CHEST - 1 VIEW  Comparison: Portable exam 1240 hours compared to 09/05/2005  Findings: Right jugular line, tip projecting over cavoatrial junction. Enlargement of cardiac silhouette with pulmonary vascular congestion. Mild left perihilar atelectasis versus infiltrate. Remaining lungs clear. No definite pleural effusion or pneumothorax.  IMPRESSION: Tip of right jugular line projects over cavoatrial junction. Enlargement of cardiac silhouette with pulmonary vascular congestion and mild left perihilar infiltrate versus atelectasis.  Original Report Authenticated By: Lollie Marrow, M.D.   Dg Abd Acute W/chest  01/19/2012  *RADIOLOGY REPORT*  Clinical Data: Right-sided chest and abdominal pain  ACUTE ABDOMEN SERIES (ABDOMEN 2 VIEW & CHEST 1 VIEW)  Comparison: No similar prior study is available for comparison.Lumbar spine radiographs 08/12/2010 are reviewed.  Findings: Moderate hiatal hernia noted.  Cardiac leads obscure detail.  Curvilinear left mid lung  zone atelectasis or scarring noted. Low lung volumes with crowding of the bronchovascular markings.  Patchy right lower lobe airspace opacity is noted particularly at the right cardiophrenic angle.  Small right pleural effusion or thickening noted.  No free air beneath the diaphragms. There are multiple air-fluid levels with diffuse mild apparent colonic distention.  No gas filled dilated loop of small bowel is seen.  Mild lumbar spine degenerative change is again identified.  IMPRESSION: Mild colonic prominence and air fluid levels which may suggest ileus.  Ill-defined right cardiophrenic angle airspace opacity could reflect atelectasis or possibly early pneumonia in  the appropriate clinical context.  No dilated gas filled loop of small bowel.  Original Report Authenticated By: Harrel Lemon, M.D.   Medications: Scheduled Meds:   . antiseptic oral rinse  15 mL Mouth Rinse BID  . aspirin EC  325 mg Oral Daily  . clopidogrel  75 mg Oral Daily  . cycloSPORINE  1 drop Both Eyes BID  . heparin  5,000 Units Subcutaneous Q8H  . insulin aspart  0-15 Units Subcutaneous TID WC  . insulin aspart  0-5 Units Subcutaneous QHS  . levothyroxine  75 mcg Oral Q0600  . LORazepam  0.5 mg Intramuscular Once  . metoprolol succinate  25 mg Oral BID  . metroNIDAZOLE  500 mg Oral Q8H  . mulitivitamin with minerals  1 tablet Oral QHS  . oxybutynin  10 mg Oral Daily  . pantoprazole  40 mg Oral Q1200  . piperacillin-tazobactam  3.375 g Intravenous Q8H  . simvastatin  20 mg Oral QPM  . sodium chloride  3 mL Intravenous Q12H  . vancomycin  500 mg Oral Q6H   Continuous Infusions:   . sodium chloride 50 mL/hr at 01/22/12 1800   PRN Meds:.ondansetron (ZOFRAN) IV, ondansetron  Assessment/Plan: Patient Active Hospital Problem List:  Diffuse peritonitis with megacolon on the right and transverse colon  Clinically has improved markedly -NG tube now out and no evidence of acute abdomen nor any indications for surgery this admission-symptoms likely related to acute C. difficile colitis-. advace diet as tolerated.  Lactic acidosis / Possible sepsis  Resolved-procalcitonin normal - lactic acid now normalized - serum bicarb normalized - resolved-clinical picture was more consistent with dehydration  Enterococcus urinary tract infect  Probably colonization. Stop zosyn.  Acute renal failure  crt peaked at 1.56 - improving with volume expansion - cont IVF support until normalized-appears to still be volume depleted despite improvement in creatinine, urine remains dark so we'll continue IV fluids at 50 cc per hour  Acute resp failure w/ BLL ASD  Evaluation of the resp failure  with a CXR , pro bnp and a 2d echo. ID consult obtained for antibiotics. +C diff PCR  oral Flagyl  and continue oral vancomycin-now has started having looser stools  Diabetes  Change maintenance fluid to normal saline-begin CBG checks with sliding scale insulin-diet controlled at home  Hypokalemia  Resolved  Hypertension  Home Diovan remains on hold due to recent acute renal failure and current soft blood pressure readings  Grade 1 diastolic dysfunction  Was on Lasix at home-as remains on hold due to acute renal failure and soft blood pressure readings  Hypothyroidism  Cont replacment tx- Elevated troponin  Likely mild stress induced/hypovelmia induced ischemia - no chest pain - echo reveals no focal WMA and normal EF-was on Plavix prior to admission but no reported history of CAD or stroke, we'll need to clarify with the patient and/or family  LOS: 4 days   Addie Cederberg 01/23/2012, 11:57 AM

## 2012-01-23 NOTE — Evaluation (Signed)
Clinical/Bedside Swallow Evaluation Patient Details  Name: [name] [name] MRN: [id number] Date of Birth: [birth year]  Today's Date: 01/23/2012 Time: 1445-1500 SLP Time Calculation (min): 15 min  Past Medical History:  Past Medical History  Diagnosis Date  . Diabetes mellitus   . Hypertension    Past Surgical History: History reviewed. No pertinent past surgical history. HPI:  [name] [name] is an 78 y.o. female with PMHx significant for DM, HTN, PUD on chronic PPI, who presented with  2 days history of abdominal discomfort, nausea, vomiting food abdominal distention to [facility]. She had significant elevated WBC, lactic acidosis, with CT showing ileus pattern. She was started on broad spectrum antibiotics including vancomycin, zosyn, flagyl, found to have C  Diff PCR positive. CXR indicated bilateral infiltrates concerning for PNA however per infectious disease MD, favor pulmonary edema   Assessment / Plan / Recommendation Clinical Impression  Patient presents with what appears to be a safe and functional oropharyngeal swallow with SOB and missing dentition mildly impacting mastication.  No overt s/s of aspiration observed. Recommend mechanical soft solids to increase energy conservation and minimize SOB. MD, if feel necessary to r/o silent aspiration based on CXR results, pleaser order objective evaluation. Otherwise, will f/u at bedside briefly.      Aspiration Risk  Mild    Diet Recommendation Dysphagia 3 (Mechanical Soft);Thin liquid      Follow Up Recommendations  None    Frequency and Duration min 2x/week  1 week   Pertinent Vitals/Pain n/a    SLP Swallow Goals Patient will consume recommended diet without observed clinical signs of aspiration with: Supervision/safety Swallow Study Goal #1 - Progress: Not Met Patient will utilize recommended strategies during swallow to increase swallowing safety with: Supervision/safety Swallow Study Goal #2 - Progress: Not met   Swallow  Study Prior Functional Status       General HPI: [name] [name] is an 78 y.o. female with PMHx significant for DM, HTN, PUD on chronic PPI, who presented with  2 days history of abdominal discomfort, nausea, vomiting food abdominal distention to [facility]. She had significant elevated WBC, lactic acidosis, with CT showing ileus pattern. She was started on broad spectrum antibiotics including vancomycin, zosyn, flagyl, found to have C  Diff PCR positive. CXR indicated bilateral infiltrates concerning for PNA however per infectious disease MD, favor pulmonary edema Type of Study: Bedside swallow evaluation Diet Prior to this Study: Regular;Thin liquids Respiratory Status: Supplemental O2 delivered via (comment) Behavior/Cognition: Alert;Cooperative;Pleasant mood Oral Cavity - Dentition: Missing dentition;Dentures, not available Vision: Functional for self-feeding Patient Positioning: Upright in bed Baseline Vocal Quality: Clear Volitional Cough: Strong Volitional Swallow: Able to elicit    Oral/Motor/Sensory Function Overall Oral Motor/Sensory Function: Appears within functional limits for tasks assessed (mild SOB effects efficiency with mastication )   Ice Chips Ice chips: Not tested   Thin Liquid Thin Liquid: Within functional limits Presentation: Cup;Straw    Nectar Thick Nectar Thick Liquid: Not tested   Honey Thick Honey Thick Liquid: Not tested   Puree Puree: Within functional limits Presentation: Spoon   Solid Solid: Within functional limits (mildly delayed but functional oral transit with mild SOB) Presentation: Self [employer] MA, CCC-SLP [phone]  Avraham Benish Meryl 01/23/2012,3:34 PM

## 2012-01-24 LAB — CULTURE, BLOOD (ROUTINE X 2)

## 2012-01-24 LAB — GLUCOSE, CAPILLARY
Glucose-Capillary: 110 mg/dL — ABNORMAL HIGH (ref 70–99)
Glucose-Capillary: 152 mg/dL — ABNORMAL HIGH (ref 70–99)
Glucose-Capillary: 161 mg/dL — ABNORMAL HIGH (ref 70–99)

## 2012-01-24 MED ORDER — FAMOTIDINE 40 MG/5ML PO SUSR: 40.0000 mg | Freq: Two times a day (BID) | ORAL | Status: DC

## 2012-01-24 MED ORDER — ALUM & MAG HYDROXIDE-SIMETH 200-200-20 MG/5ML PO SUSP: 15.0000 mL | Freq: Four times a day (QID) | ORAL | Status: DC | PRN

## 2012-01-24 MED ORDER — FAMOTIDINE 40 MG/5ML PO SUSR
40.0000 mg | Freq: Every day | ORAL | Status: DC
  Administered 2012-01-24 – 2012-01-26 (×3): 40 mg via ORAL
  Filled 2012-01-24 (×3): qty 5

## 2012-01-24 NOTE — Evaluation (Signed)
Physical Therapy Evaluation Patient Details Name: [name] [name] MRN: [id number] DOB: 10-19-1930 Today's Date: 01/24/2012 Time: 9147-8295 PT Time Calculation (min): 25 min  PT Assessment / Plan / Recommendation Clinical Impression  Pt is 78 y/o female admitted for nausea and vomiting.  MRI showed ilieus.  Pt will benefit from acute PT services.  Pt currently limited due to fatigue with SOB.  SaO2 decreased to 87% on RA with ambulation therefore return oxygen and currently on 4L reading 96%.    PT Assessment  Patient needs continued PT services    Follow Up Recommendations  Home health PT;Supervision/Assistance - 24 hour    Barriers to Discharge        lEquipment Recommendations  None recommended by PT    Recommendations for Other Services     Frequency Min 3X/week    Precautions / Restrictions     Pertinent Vitals/Pain No c/o pain      Mobility  Bed Mobility Bed Mobility: Supine to Sit Supine to Sit: 4: Min assist;HOB flat Details for Bed Mobility Assistance: (A) to elevate shoulders OOB.  Cues for technique and hand placement Transfers Transfers: Sit to Stand;Stand to Sit Sit to Stand: 4: Min assist;From bed;With armrests Stand to Sit: 4: Min assist;To chair/3-in-1 Details for Transfer Assistance: (A) to initiate transfer with cues for hand placement Ambulation/Gait Ambulation/Gait Assistance: 4: Min guard Ambulation Distance (Feet): 30 Feet Assistive device: Rolling walker Ambulation/Gait Assistance Details: Minguard for safety with cues for RW placement Gait Pattern: Within Functional Limits    Exercises     PT Diagnosis: Difficulty walking;Generalized weakness  PT Problem List: Decreased strength;Decreased activity tolerance;Decreased balance;Decreased mobility;Decreased cognition;Decreased knowledge of use of DME PT Treatment Interventions: DME instruction;Gait training;Stair training;Functional mobility training;Therapeutic activities;Therapeutic  exercise;Balance training;Patient/family education   PT Goals Acute Rehab PT Goals PT Goal Formulation: With patient Time For Goal Achievement: 01/31/12 Potential to Achieve Goals: Good Pt will go Supine/Side to Sit: with modified independence PT Goal: Supine/Side to Sit - Progress: Goal set today Pt will go Sit to Supine/Side: with modified independence PT Goal: Sit to Supine/Side - Progress: Goal set today Pt will go Sit to Stand: with modified independence PT Goal: Sit to Stand - Progress: Goal set today Pt will go Stand to Sit: with modified independence PT Goal: Stand to Sit - Progress: Goal set today Pt will Ambulate: >150 feet;with modified independence;with rolling walker PT Goal: Ambulate - Progress: Goal set today  Visit Information  Last PT Received On: 01/24/12 Assistance Needed: +1    Subjective Data  Subjective: "I can't get out of bed I just got an enema." Patient Stated Goal: To return home.   Prior Functioning  Home Living Lives With: Son Available Help at Discharge:  (Pt unsure if anyone else can stay with her) Type of Home: Apartment Home Access: Level entry Home Layout: One level Bathroom Shower/Tub: Engineer, manufacturing systems: Standard Bathroom Accessibility: Yes How Accessible: Accessible via walker Home Adaptive Equipment: Walker - rolling Additional Comments: Pt has been confused and unsure if pt is responding to questions about home living correctly Prior Function Level of Independence: Independent Communication Communication: No difficulties    Cognition  Overall Cognitive Status: Impaired Area of Impairment: Memory Arousal/Alertness: Awake/alert Orientation Level: Disoriented to;Time Behavior During Session: WFL for tasks performed    Extremity/Trunk Assessment Right Lower Extremity Assessment RLE ROM/Strength/Tone: Within functional levels Left Lower Extremity Assessment LLE ROM/Strength/Tone: Within functional levels   Balance  Balance Balance Assessed: Yes Static Sitting Balance Static  Sitting - Balance Support: Feet supported Static Sitting - Level of Assistance: 7: Independent  End of Session PT - End of Session Equipment Utilized During Treatment: Gait belt;Oxygen Activity Tolerance: Patient limited by fatigue Patient left: in chair;with call bell/phone within reach Ophelia Charter) Nurse Communication: Mobility status   Melvena Vink 01/24/2012, 12:44 PM Jake Shark, PT DPT [phone]

## 2012-01-24 NOTE — Progress Notes (Signed)
INFECTIOUS DISEASE PROGRESS NOTE  ID: [name] [name] is a 78 y.o. female with severe c.difficile colitis  Subjective: Afebrile, doing somewhat better, complains of heartburn. Reports decreased diarrhea.  Abtx:   vanco PO #2 Metronidazole IV #2 vanco PR #2 (previously txd for hcap with piptazo and 3 days of vanco)  Medications:     . antiseptic oral rinse  15 mL Mouth Rinse BID  . aspirin EC  325 mg Oral Daily  . clopidogrel  75 mg Oral Daily  . cycloSPORINE  1 drop Both Eyes BID  . heparin  5,000 Units Subcutaneous Q8H  . insulin aspart  0-15 Units Subcutaneous TID WC  . insulin aspart  0-5 Units Subcutaneous QHS  . levothyroxine  75 mcg Oral Q0600  . metoprolol succinate  50 mg Oral BID  . vancomycin  500 mg Oral Q6H   And  . metronidazole  500 mg Intravenous Q8H  . mulitivitamin with minerals  1 tablet Oral QHS  . oxybutynin  10 mg Oral Daily  . simvastatin  20 mg Oral QPM  . sodium chloride  3 mL Intravenous Q12H  . vancomycin (VANCOCIN) rectal ENEMA  500 mg Rectal Q6H  . DISCONTD: metoprolol succinate  25 mg Oral BID   Objective: Vital signs in last 24 hours: Temp:  [96.3 F (35.7 C)-97.2 F (36.2 C)] 97 F (36.1 C) (05/18 0554) Pulse Rate:  [81-114] 91  (05/18 0554) Resp:  [18-24] 20  (05/18 0554) BP: (114-168)/(70-91) 114/70 mmHg (05/18 0554) SpO2:  [84 %-95 %] 94 % (05/18 0554)  Gen= elderly female, edentulous, in NAD HEENT= [place]/AT, perrla, eomi, no scleral icterus, MMM Pulm= CTAB Abd= mildly distend, slow bowel sounds, nontender Skin= warm, no rash, no diaphoresis Ext= no c/c/e  Lab Results  [facility] 01/23/12 0645 01/22/12 0410  WBC 14.7* 11.0*  HGB 10.1* 10.2*  HCT 29.7* 28.8*  NA 127* 132*  K 3.6 3.5  CL 94* 99  CO2 18* 20  BUN 20 20  CREATININE 1.38* 1.22*  GLU -- --   Liver Panel  Basename 01/22/12 0410  PROT 5.9*  ALBUMIN 2.4*  AST 19  ALT 14  ALKPHOS 69  BILITOT 0.7  BILIDIR --  IBILI --    Microbiology: 5/15 c.difficile  POSITIVE 5/13 blood NGTD 5/13 Ur= enterococcus (amp R, vanco S)  Studies/Results: Dg Chest 2 View  01/23/2012  *RADIOLOGY REPORT*  Clinical Data: Pulmonary edema versus pneumonia.  CHEST - 2 VIEW  Comparison: 01/22/2012.  Findings: Low volume chest.  Cardiomegaly.  Diffuse airspace opacities remain present.  Worsening of aeration at the right lung base compared to yesterday's examination.  This is probably associated with lower lung volumes and this produced by atelectasis.  IMPRESSION: Worsening pulmonary aeration with lower lung volumes than prior. Diffuse multi focal airspace disease.  The airspace disease is essentially unchanged and may represent pneumonia or asymmetric/atypical pulmonary edema.  Original Report Authenticated By: Andreas Newport, M.D.     Assessment/Plan: Severe c.difficile colitis = continue with IV metronidazole, PO vancomycin and PR vancomycin for now. If she continues to do well, will discontinue IV metronidazole tomorrow.  Asymptomatic bacturia = no need to treat  hcap = finished course of piptazo  Lilyana Lippman Infectious Diseases 01/24/2012, 1:22 PM

## 2012-01-24 NOTE — Progress Notes (Signed)
Subjective: Complains of heartburn.  Objective: Weight change:   Intake/Output Summary (Last 24 hours) at 01/24/12 1750 Last data filed at 01/24/12 1436  Gross per 24 hour  Intake    360 ml  Output      0 ml  Net    360 ml    Filed Vitals:   01/24/12 1435  BP: 127/83  Pulse: 91  Temp: 98.1 F (36.7 C)  Resp: 20   General: alert afebrile comfortable.  Lungs: mild bibasilar crackles - no wheeze  Cardiovascular: Regular rate and rhythm without murmur gallop or rub  Abdomen: mildly tender to deep palpation diffusely - no appreciable mass - no rebound, bs+  Extremities: No significant cyanosis, clubbing, or edema bilateral lower extremities   Lab Results: Results for orders placed during the hospital encounter of 01/19/12 (from the past 24 hour(s))  GLUCOSE, CAPILLARY     Status: Abnormal   Collection Time   01/23/12  9:11 PM      Component Value Range   Glucose-Capillary 154 (*) 70 - 99 (mg/dL)  GLUCOSE, CAPILLARY     Status: Abnormal   Collection Time   01/24/12  7:45 AM      Component Value Range   Glucose-Capillary 110 (*) 70 - 99 (mg/dL)  GLUCOSE, CAPILLARY     Status: Abnormal   Collection Time   01/24/12 12:13 PM      Component Value Range   Glucose-Capillary 186 (*) 70 - 99 (mg/dL)  GLUCOSE, CAPILLARY     Status: Abnormal   Collection Time   01/24/12  4:58 PM      Component Value Range   Glucose-Capillary 152 (*) 70 - 99 (mg/dL)     Micro Results: Recent Results (from the past 240 hour(s))  URINE CULTURE     Status: Normal   Collection Time   01/19/12  3:42 PM      Component Value Range Status Comment   Specimen Description URINE, CATHETERIZED   Final    Special Requests NONE   Final    Culture  Setup Time [id number]   Final    Colony Count >=100,000 COLONIES/ML   Final    Culture ENTEROCOCCUS SPECIES   Final    Report Status 01/23/2012 FINAL   Final    Organism ID, Bacteria ENTEROCOCCUS SPECIES   Final   CULTURE, BLOOD (ROUTINE X 2)     Status:  Normal   Collection Time   01/19/12  7:00 PM      Component Value Range Status Comment   Specimen Description BLOOD LEFT HAND   Final    Special Requests BOTTLES DRAWN AEROBIC AND ANAEROBIC 6CC   Final    Culture NO GROWTH 5 DAYS   Final    Report Status 01/24/2012 FINAL   Final   CULTURE, BLOOD (ROUTINE X 2)     Status: Normal   Collection Time   01/19/12  7:10 PM      Component Value Range Status Comment   Specimen Description BLOOD RIGHT HAND   Final    Special Requests BOTTLES DRAWN AEROBIC AND ANAEROBIC 6CC   Final    Culture NO GROWTH 5 DAYS   Final    Report Status 01/24/2012 FINAL   Final   MRSA PCR SCREENING     Status: Normal   Collection Time   01/19/12 11:09 PM      Component Value Range Status Comment   MRSA by PCR NEGATIVE  NEGATIVE  Final  CLOSTRIDIUM DIFFICILE BY PCR     Status: Abnormal   Collection Time   01/21/12 12:19 PM      Component Value Range Status Comment   C difficile by pcr POSITIVE (*) NEGATIVE  Final     Studies/Results: Dg Chest 2 View  01/23/2012  *RADIOLOGY REPORT*  Clinical Data: Pulmonary edema versus pneumonia.  CHEST - 2 VIEW  Comparison: 01/22/2012.  Findings: Low volume chest.  Cardiomegaly.  Diffuse airspace opacities remain present.  Worsening of aeration at the right lung base compared to yesterday's examination.  This is probably associated with lower lung volumes and this produced by atelectasis.  IMPRESSION: Worsening pulmonary aeration with lower lung volumes than prior. Diffuse multi focal airspace disease.  The airspace disease is essentially unchanged and may represent pneumonia or asymmetric/atypical pulmonary edema.  Original Report Authenticated By: Andreas Newport, M.D.   Ct Abdomen Pelvis W Contrast  01/19/2012  *RADIOLOGY REPORT*  Clinical Data: Abdominal pain  CT ABDOMEN AND PELVIS WITH CONTRAST  Technique:  Multidetector CT imaging of the abdomen and pelvis was performed following the standard protocol during bolus administration  of intravenous contrast.  Contrast: OMNIPAQUE IOHEXOL 300 MG/ML  SOLN  Comparison: 04/18/2009  Findings: Large hiatal hernia again noted.  New areas of wedge- shaped bilateral lower lobe probable atelectasis noted with mild bronchial wall thickening.  Incompletely visualized lingular patchy airspace opacity predominately on the first image of the study is noted.  Liver, gallbladder, adrenal glands, spleen, and pancreas are normal.  Mild renal cortical thinning noted bilaterally.  Imaging through the kidneys is minimally degraded by motion artifact but no cortical lesion is identified.  No hydronephrosis.  No radiopaque renal or ureteral calculus allowing for technique.  The kidneys symmetrically and bilaterally enhance and excrete contrast into nondilated renal collecting systems.  Moderate gas and fluid noted throughout the colon with gradual decompression at the level of the mid descending colon to decompressed redundant sigmoid colon.  No mass lesion, bowel wall thickening, or dilatation is otherwise identified.  Small bowel is decompressed.  Appendix is normal.  Mild atherosclerotic aortic calcification without aneurysm.  Lobulated uterine contour again noted, likely due to fibroids. Right ovary is normal.  Left ovary not identified but no adnexal mass is seen on the left.  No pelvic free fluid or lymphadenopathy. Bladder is normal.  Degenerative changes are noted in the spine.  No acute osseous finding.  Minimal lower thoracic central vertebral body height loss noted without overt compression deformity.  IMPRESSION: Proximal and mid colonic prominence with air fluid levels but gradual decompression to a normal caliber decompressed sigmoid colon.  Localized ileus or non visualized adhesion/mass lesion are within the differential diagnosis.  The pattern is not typical for volvulus given the gradual decompression of the colon.  Incompletely imaged lingular and bilateral lower lobe airspace opacities.   Atelectasis or early pneumonia could have this appearance.  Original Report Authenticated By: Harrel Lemon, M.D.   Dg Chest Port 1 View  01/22/2012  *RADIOLOGY REPORT*  Clinical Data: Follow up infiltrate.  PORTABLE CHEST - 1 VIEW  Comparison: 01/20/2012  Findings: Worsening bilateral airspace disease, diffuse throughout the left lung and most confluent in the right upper lobe.  Suspect pneumonia.  Cardiomegaly with vascular congestion.  No visible effusions.  IMPRESSION: Worsening bilateral airspace disease, left greater than right, presumably infection.  Original Report Authenticated By: Cyndie Chime, M.D.   Dg Chest Port 1 View  01/20/2012  *RADIOLOGY REPORT*  Clinical  Data: Right jugular line placement  PORTABLE CHEST - 1 VIEW  Comparison: Portable exam 1240 hours compared to 09/05/2005  Findings: Right jugular line, tip projecting over cavoatrial junction. Enlargement of cardiac silhouette with pulmonary vascular congestion. Mild left perihilar atelectasis versus infiltrate. Remaining lungs clear. No definite pleural effusion or pneumothorax.  IMPRESSION: Tip of right jugular line projects over cavoatrial junction. Enlargement of cardiac silhouette with pulmonary vascular congestion and mild left perihilar infiltrate versus atelectasis.  Original Report Authenticated By: Lollie Marrow, M.D.   Dg Abd Acute W/chest  01/19/2012  *RADIOLOGY REPORT*  Clinical Data: Right-sided chest and abdominal pain  ACUTE ABDOMEN SERIES (ABDOMEN 2 VIEW & CHEST 1 VIEW)  Comparison: No similar prior study is available for comparison.Lumbar spine radiographs 08/12/2010 are reviewed.  Findings: Moderate hiatal hernia noted.  Cardiac leads obscure detail.  Curvilinear left mid lung zone atelectasis or scarring noted. Low lung volumes with crowding of the bronchovascular markings.  Patchy right lower lobe airspace opacity is noted particularly at the right cardiophrenic angle.  Small right pleural effusion or thickening  noted.  No free air beneath the diaphragms. There are multiple air-fluid levels with diffuse mild apparent colonic distention.  No gas filled dilated loop of small bowel is seen.  Mild lumbar spine degenerative change is again identified.  IMPRESSION: Mild colonic prominence and air fluid levels which may suggest ileus.  Ill-defined right cardiophrenic angle airspace opacity could reflect atelectasis or possibly early pneumonia in the appropriate clinical context.  No dilated gas filled loop of small bowel.  Original Report Authenticated By: Harrel Lemon, M.D.   Medications: Scheduled Meds:   . antiseptic oral rinse  15 mL Mouth Rinse BID  . aspirin EC  325 mg Oral Daily  . clopidogrel  75 mg Oral Daily  . cycloSPORINE  1 drop Both Eyes BID  . famotidine  40 mg Oral Daily  . heparin  5,000 Units Subcutaneous Q8H  . insulin aspart  0-15 Units Subcutaneous TID WC  . insulin aspart  0-5 Units Subcutaneous QHS  . levothyroxine  75 mcg Oral Q0600  . metoprolol succinate  50 mg Oral BID  . vancomycin  500 mg Oral Q6H   And  . metronidazole  500 mg Intravenous Q8H  . mulitivitamin with minerals  1 tablet Oral QHS  . oxybutynin  10 mg Oral Daily  . simvastatin  20 mg Oral QPM  . sodium chloride  3 mL Intravenous Q12H  . vancomycin (VANCOCIN) rectal ENEMA  500 mg Rectal Q6H  . DISCONTD: famotidine  40 mg Oral BID   Continuous Infusions:  PRN Meds:.acetaminophen, alum & mag hydroxide-simeth, LORazepam, ondansetron (ZOFRAN) IV, ondansetron, DISCONTD: famotidine  Assessment/Plan: Patient Active Hospital Problem List: Diffuse peritonitis with megacolon on the right and transverse colon  Clinically has improved markedly -NG tube now out and no evidence of acute abdomen nor any indications for surgery this admission-symptoms likely related to acute C. difficile colitis-. advace diet as tolerated.  Continue with flagyl and vancomycin.  Lactic acidosis / Possible sepsis  Resolved-procalcitonin  normal - lactic acid now normalized - serum bicarb normalized - resolved-clinical picture was more consistent with dehydration  Enterococcus urinary tract infect  Probably colonization. Stop zosyn.  Acute renal failure : Creatinine still elevated at 1.36. Will get repeat labs tonight.  Acute resp failure w/ BLL ASD  Evaluation of the resp failure with a CXR shows worsening pulmonary aeration, , pro bnp is elevated and  2d echo is  pending. Will give a small dose of lasix tonight.  ID consult obtained for antibiotics.  +C diff PCR  oral Flagyl and continue oral vancomycin-now has started having looser stools  Diabetes  Change maintenance fluid to normal saline-begin CBG checks with sliding scale insulin-diet controlled at home  Hypokalemia  Resolved  Hypertension  Home Diovan remains on hold due to recent acute renal failure and current soft blood pressure readings  Grade 1 diastolic dysfunction  Was on Lasix at home-as remains on hold due to acute renal failure and soft blood pressure readings  Hypothyroidism  Cont replacment tx-  Elevated troponin  Likely mild stress induced/hypovelmia induced ischemia - no chest pain - echo reveals no focal WMA and normal EF-was on Plavix prior to admission but no reported history of CAD or stroke, we'll need to clarify with the patient and/or family. Heartburn: started pt on pepcid.      LOS: 5 days   Klayton Monie 01/24/2012, 5:50 PM

## 2012-01-25 LAB — BASIC METABOLIC PANEL
CO2: 21 mEq/L (ref 19–32)
Calcium: 8.7 mg/dL (ref 8.4–10.5)
GFR calc non Af Amer: 41 mL/min — ABNORMAL LOW (ref >90)
Potassium: 3.7 mEq/L (ref 3.5–5.1)
Sodium: 133 mEq/L — ABNORMAL LOW (ref 135–145)

## 2012-01-25 LAB — CBC
MCH: 29.8 pg (ref 26.0–34.0)
MCHC: 34.3 g/dL (ref 30.0–36.0)
Platelets: 226 10*3/uL (ref 150–400)
RBC: 2.92 MIL/uL — ABNORMAL LOW (ref 3.87–5.11)

## 2012-01-25 LAB — GLUCOSE, CAPILLARY
Glucose-Capillary: 204 mg/dL — ABNORMAL HIGH (ref 70–99)
Glucose-Capillary: 142 mg/dL — ABNORMAL HIGH (ref 70–99)

## 2012-01-25 MED ORDER — GUAIFENESIN 100 MG/5ML PO SOLN
10.0000 mL | ORAL | Status: DC | PRN
  Filled 2012-01-25: qty 10

## 2012-01-25 MED ORDER — VANCOMYCIN 50 MG/ML ORAL SOLUTION
125.0000 mg | Freq: Four times a day (QID) | ORAL | Status: DC
  Administered 2012-01-25 – 2012-01-26 (×3): 125 mg via ORAL
  Filled 2012-01-25 (×7): qty 2.5

## 2012-01-25 MED ORDER — GUAIFENESIN 100 MG/5ML PO SYRP
200.0000 mg | ORAL_SOLUTION | ORAL | Status: DC | PRN
  Filled 2012-01-25: qty 118

## 2012-01-25 NOTE — Progress Notes (Signed)
Subjective HEARTBURN better today. Still has occasional cough.  Objective: Weight change:   Intake/Output Summary (Last 24 hours) at 01/25/12 0942 Last data filed at 01/25/12 0841  Gross per 24 hour  Intake    360 ml  Output      0 ml  Net    360 ml    Filed Vitals:   01/25/12 0517  BP: 142/88  Pulse: 99  Temp: 97.5 F (36.4 C)  Resp: 20   General: alert afebrile comfortable.  Lungs: mild bibasilar crackles - no wheeze  Cardiovascular: Regular rate and rhythm without murmur gallop or rub  Abdomen: mildly tender to deep palpation diffusely - no appreciable mass - no rebound, bs+  Extremities: No significant cyanosis, clubbing, or edema bilateral lower extremities   Lab Results: Results for orders placed during the hospital encounter of 01/19/12 (from the past 24 hour(s))  GLUCOSE, CAPILLARY     Status: Abnormal   Collection Time   01/24/12 12:13 PM      Component Value Range   Glucose-Capillary 186 (*) 70 - 99 (mg/dL)  GLUCOSE, CAPILLARY     Status: Abnormal   Collection Time   01/24/12  4:58 PM      Component Value Range   Glucose-Capillary 152 (*) 70 - 99 (mg/dL)  GLUCOSE, CAPILLARY     Status: Abnormal   Collection Time   01/24/12  9:24 PM      Component Value Range   Glucose-Capillary 161 (*) 70 - 99 (mg/dL)   Comment 1 Notify RN    GLUCOSE, CAPILLARY     Status: Abnormal   Collection Time   01/25/12  8:24 AM      Component Value Range   Glucose-Capillary 204 (*) 70 - 99 (mg/dL)   Comment 1 Documented in Chart     Comment 2 Notify RN    CBC     Status: Abnormal   Collection Time   01/25/12  9:08 AM      Component Value Range   WBC 8.9  4.0 - 10.5 (K/uL)   RBC 2.92 (*) 3.87 - 5.11 (MIL/uL)   Hemoglobin 8.7 (*) 12.0 - 15.0 (g/dL)   HCT 09.8 (*) 11.9 - 46.0 (%)   MCV 87.0  78.0 - 100.0 (fL)   MCH 29.8  26.0 - 34.0 (pg)   MCHC 34.3  30.0 - 36.0 (g/dL)   RDW 14.7  82.9 - 56.2 (%)   Platelets 226  150 - 400 (K/uL)  BASIC METABOLIC PANEL     Status: Abnormal     Collection Time   01/25/12  9:08 AM      Component Value Range   Sodium 133 (*) 135 - 145 (mEq/L)   Potassium 3.7  3.5 - 5.1 (mEq/L)   Chloride 99  96 - 112 (mEq/L)   CO2 21  19 - 32 (mEq/L)   Glucose, Bld 210 (*) 70 - 99 (mg/dL)   BUN 20  6 - 23 (mg/dL)   Creatinine, Ser 1.30 (*) 0.50 - 1.10 (mg/dL)   Calcium 8.7  8.4 - 86.5 (mg/dL)   GFR calc non Af Amer 41 (*) >90 (mL/min)   GFR calc Af Amer 47 (*) >90 (mL/min)     Micro Results: Recent Results (from the past 240 hour(s))  URINE CULTURE     Status: Normal   Collection Time   01/19/12  3:42 PM      Component Value Range Status Comment   Specimen Description URINE, CATHETERIZED  Final    Special Requests NONE   Final    Culture  Setup Time [id number]   Final    Colony Count >=100,000 COLONIES/ML   Final    Culture ENTEROCOCCUS SPECIES   Final    Report Status 01/23/2012 FINAL   Final    Organism ID, Bacteria ENTEROCOCCUS SPECIES   Final   CULTURE, BLOOD (ROUTINE X 2)     Status: Normal   Collection Time   01/19/12  7:00 PM      Component Value Range Status Comment   Specimen Description BLOOD LEFT HAND   Final    Special Requests BOTTLES DRAWN AEROBIC AND ANAEROBIC 6CC   Final    Culture NO GROWTH 5 DAYS   Final    Report Status 01/24/2012 FINAL   Final   CULTURE, BLOOD (ROUTINE X 2)     Status: Normal   Collection Time   01/19/12  7:10 PM      Component Value Range Status Comment   Specimen Description BLOOD RIGHT HAND   Final    Special Requests BOTTLES DRAWN AEROBIC AND ANAEROBIC 6CC   Final    Culture NO GROWTH 5 DAYS   Final    Report Status 01/24/2012 FINAL   Final   MRSA PCR SCREENING     Status: Normal   Collection Time   01/19/12 11:09 PM      Component Value Range Status Comment   MRSA by PCR NEGATIVE  NEGATIVE  Final   CLOSTRIDIUM DIFFICILE BY PCR     Status: Abnormal   Collection Time   01/21/12 12:19 PM      Component Value Range Status Comment   C difficile by pcr POSITIVE (*) NEGATIVE  Final      Studies/Results: Dg Chest 2 View  01/23/2012  *RADIOLOGY REPORT*  Clinical Data: Pulmonary edema versus pneumonia.  CHEST - 2 VIEW  Comparison: 01/22/2012.  Findings: Low volume chest.  Cardiomegaly.  Diffuse airspace opacities remain present.  Worsening of aeration at the right lung base compared to yesterday's examination.  This is probably associated with lower lung volumes and this produced by atelectasis.  IMPRESSION: Worsening pulmonary aeration with lower lung volumes than prior. Diffuse multi focal airspace disease.  The airspace disease is essentially unchanged and may represent pneumonia or asymmetric/atypical pulmonary edema.  Original Report Authenticated By: Andreas Newport, M.D.   Ct Abdomen Pelvis W Contrast  01/19/2012  *RADIOLOGY REPORT*  Clinical Data: Abdominal pain  CT ABDOMEN AND PELVIS WITH CONTRAST  Technique:  Multidetector CT imaging of the abdomen and pelvis was performed following the standard protocol during bolus administration of intravenous contrast.  Contrast: OMNIPAQUE IOHEXOL 300 MG/ML  SOLN  Comparison: 04/18/2009  Findings: Large hiatal hernia again noted.  New areas of wedge- shaped bilateral lower lobe probable atelectasis noted with mild bronchial wall thickening.  Incompletely visualized lingular patchy airspace opacity predominately on the first image of the study is noted.  Liver, gallbladder, adrenal glands, spleen, and pancreas are normal.  Mild renal cortical thinning noted bilaterally.  Imaging through the kidneys is minimally degraded by motion artifact but no cortical lesion is identified.  No hydronephrosis.  No radiopaque renal or ureteral calculus allowing for technique.  The kidneys symmetrically and bilaterally enhance and excrete contrast into nondilated renal collecting systems.  Moderate gas and fluid noted throughout the colon with gradual decompression at the level of the mid descending colon to decompressed redundant sigmoid colon.  No mass  lesion,  bowel wall thickening, or dilatation is otherwise identified.  Small bowel is decompressed.  Appendix is normal.  Mild atherosclerotic aortic calcification without aneurysm.  Lobulated uterine contour again noted, likely due to fibroids. Right ovary is normal.  Left ovary not identified but no adnexal mass is seen on the left.  No pelvic free fluid or lymphadenopathy. Bladder is normal.  Degenerative changes are noted in the spine.  No acute osseous finding.  Minimal lower thoracic central vertebral body height loss noted without overt compression deformity.  IMPRESSION: Proximal and mid colonic prominence with air fluid levels but gradual decompression to a normal caliber decompressed sigmoid colon.  Localized ileus or non visualized adhesion/mass lesion are within the differential diagnosis.  The pattern is not typical for volvulus given the gradual decompression of the colon.  Incompletely imaged lingular and bilateral lower lobe airspace opacities.  Atelectasis or early pneumonia could have this appearance.  Original Report Authenticated By: Harrel Lemon, M.D.   Dg Chest Port 1 View  01/22/2012  *RADIOLOGY REPORT*  Clinical Data: Follow up infiltrate.  PORTABLE CHEST - 1 VIEW  Comparison: 01/20/2012  Findings: Worsening bilateral airspace disease, diffuse throughout the left lung and most confluent in the right upper lobe.  Suspect pneumonia.  Cardiomegaly with vascular congestion.  No visible effusions.  IMPRESSION: Worsening bilateral airspace disease, left greater than right, presumably infection.  Original Report Authenticated By: Cyndie Chime, M.D.   Dg Chest Port 1 View  01/20/2012  *RADIOLOGY REPORT*  Clinical Data: Right jugular line placement  PORTABLE CHEST - 1 VIEW  Comparison: Portable exam 1240 hours compared to 09/05/2005  Findings: Right jugular line, tip projecting over cavoatrial junction. Enlargement of cardiac silhouette with pulmonary vascular congestion. Mild left  perihilar atelectasis versus infiltrate. Remaining lungs clear. No definite pleural effusion or pneumothorax.  IMPRESSION: Tip of right jugular line projects over cavoatrial junction. Enlargement of cardiac silhouette with pulmonary vascular congestion and mild left perihilar infiltrate versus atelectasis.  Original Report Authenticated By: Lollie Marrow, M.D.   Dg Abd Acute W/chest  01/19/2012  *RADIOLOGY REPORT*  Clinical Data: Right-sided chest and abdominal pain  ACUTE ABDOMEN SERIES (ABDOMEN 2 VIEW & CHEST 1 VIEW)  Comparison: No similar prior study is available for comparison.Lumbar spine radiographs 08/12/2010 are reviewed.  Findings: Moderate hiatal hernia noted.  Cardiac leads obscure detail.  Curvilinear left mid lung zone atelectasis or scarring noted. Low lung volumes with crowding of the bronchovascular markings.  Patchy right lower lobe airspace opacity is noted particularly at the right cardiophrenic angle.  Small right pleural effusion or thickening noted.  No free air beneath the diaphragms. There are multiple air-fluid levels with diffuse mild apparent colonic distention.  No gas filled dilated loop of small bowel is seen.  Mild lumbar spine degenerative change is again identified.  IMPRESSION: Mild colonic prominence and air fluid levels which may suggest ileus.  Ill-defined right cardiophrenic angle airspace opacity could reflect atelectasis or possibly early pneumonia in the appropriate clinical context.  No dilated gas filled loop of small bowel.  Original Report Authenticated By: Harrel Lemon, M.D.   Medications: Scheduled Meds:   . antiseptic oral rinse  15 mL Mouth Rinse BID  . aspirin EC  325 mg Oral Daily  . clopidogrel  75 mg Oral Daily  . cycloSPORINE  1 drop Both Eyes BID  . famotidine  40 mg Oral Daily  . heparin  5,000 Units Subcutaneous Q8H  . insulin aspart  0-15 Units  Subcutaneous TID WC  . insulin aspart  0-5 Units Subcutaneous QHS  . levothyroxine  75 mcg  Oral Q0600  . metoprolol succinate  50 mg Oral BID  . vancomycin  500 mg Oral Q6H   And  . metronidazole  500 mg Intravenous Q8H  . mulitivitamin with minerals  1 tablet Oral QHS  . oxybutynin  10 mg Oral Daily  . simvastatin  20 mg Oral QPM  . sodium chloride  3 mL Intravenous Q12H  . vancomycin (VANCOCIN) rectal ENEMA  500 mg Rectal Q6H  . DISCONTD: famotidine  40 mg Oral BID   Continuous Infusions:  PRN Meds:.acetaminophen, alum & mag hydroxide-simeth, LORazepam, ondansetron (ZOFRAN) IV, ondansetron, DISCONTD: famotidine  Assessment/Plan: Diffuse peritonitis with megacolon on the right and transverse colon secondary to C diff colitis Clinically has improved markedly -NG tube now out and no evidence of acute abdomen nor any indications for surgery this admission-symptoms likely related to acute C. difficile colitis-. advace diet as tolerated.  Will discontinue flagyl today, as she doing much better, no diarrhea.   Lactic acidosis / Possible sepsis  Resolved-procalcitonin normal - lactic acid now normalized - serum bicarb normalized - resolved-clinical picture was more consistent with dehydration  Enterococcus urinary tract infect  Probably colonization. Stopped zosyn.  Acute renal failure : improving, probably pre rena azotemia.  Creatinine still elevated at 1.2. Continue to monitor.  Acute resp failure w/ BLL ASD  Evaluation of the resp failure with a CXR on 5/17 shows worsening pulmonary aeration, , pro bnp is elevated and 2d echo  on 5/14 shows left ventricular systolic function normal without any regional wall motion abnormalities. And with grade 1 diastolic dysfunction. Will repeat CXR In am and see if she needs any lasix. Currently she is off oxygen and has good oxygen saturations on room air.   +C diff PCR  On vancomycin.   Diabetes Mellitus:  CBG (last 3)   Basename 01/25/12 0824 01/24/12 2124 01/24/12 1658  GLUCAP 204* 161* 152*   Continue with SSI. Change maintenance  fluid to normal saline-begin CBG checks with sliding scale insulin-diet controlled at home.  Hypokalemia  Resolved  Hypertension  Home Diovan remains on hold due to recent acute renal failure and current soft blood pressure readings. Grade 1 diastolic dysfunction  Was on Lasix at home-as remains on hold due to acute renal failure and soft blood pressure readings. Hypothyroidism  Cont replacment tx-  Elevated troponin  Likely mild stress induced/hypovelmia induced ischemia - no chest pain - echo reveals no focal WMA and normal EF-was on Plavix prior to admission but no reported history of CAD or stroke, we'll need to clarify with the patient and/or family.  Heartburn: started pt on pepcid.  Disposition: d/c Home with Home PT.      LOS: 6 days   Yahia Bottger 01/25/2012, 9:42 AM

## 2012-01-25 NOTE — Progress Notes (Addendum)
INFECTIOUS DISEASE PROGRESS NOTE  ID: [name] [name] is a 78 y.o. female with severe c.difficile colitis  Subjective: Afebrile, doing much better, complains of heartburn. Reports decreased diarrhea. Having good appetite  Abtx:   vanco PO #2 Metronidazole IV #2 vanco PR #2 (previously txd for hcap with piptazo and 3 days of vanco)  Medications:     . antiseptic oral rinse  15 mL Mouth Rinse BID  . aspirin EC  325 mg Oral Daily  . clopidogrel  75 mg Oral Daily  . cycloSPORINE  1 drop Both Eyes BID  . famotidine  40 mg Oral Daily  . heparin  5,000 Units Subcutaneous Q8H  . insulin aspart  0-15 Units Subcutaneous TID WC  . insulin aspart  0-5 Units Subcutaneous QHS  . levothyroxine  75 mcg Oral Q0600  . metoprolol succinate  50 mg Oral BID  . mulitivitamin with minerals  1 tablet Oral QHS  . oxybutynin  10 mg Oral Daily  . simvastatin  20 mg Oral QPM  . sodium chloride  3 mL Intravenous Q12H  . vancomycin  500 mg Oral Q6H  . vancomycin (VANCOCIN) rectal ENEMA  500 mg Rectal Q6H  . DISCONTD: famotidine  40 mg Oral BID  . DISCONTD: metronidazole  500 mg Intravenous Q8H   Objective: Vital signs in last 24 hours: Temp:  [97.5 F (36.4 C)-98.1 F (36.7 C)] 97.5 F (36.4 C) (05/19 0517) Pulse Rate:  [46-99] 99  (05/19 0517) Resp:  [20] 20  (05/19 0517) BP: (127-142)/(82-88) 142/88 mmHg (05/19 0517) SpO2:  [96 %-100 %] 100 % (05/19 0517)  Gen= elderly female, edentulous, in NAD HEENT= [place]/AT, perrla, eomi, no scleral icterus, MMM Pulm= CTAB Abd= mildly distend, slow bowel sounds, nontender Skin= warm, no rash, no diaphoresis Ext= no c/c/e  Lab Results  [facility] 01/25/12 0908 01/23/12 0645  WBC 8.9 14.7*  HGB 8.7* 10.1*  HCT 25.4* 29.7*  NA 133* 127*  K 3.7 3.6  CL 99 94*  CO2 21 18*  BUN 20 20  CREATININE 1.23* 1.38*  GLU -- --   Liver Panel No results found for this basename: PROT:2,ALBUMIN:2,AST:2,ALT:2,ALKPHOS:2,BILITOT:2,BILIDIR:2,IBILI:2 in the last 72  hours  Microbiology: 5/15 c.difficile POSITIVE 5/13 blood NGTD 5/13 Ur= enterococcus (amp R, vanco S)  Studies/Results: Dg Chest 2 View  01/23/2012  *RADIOLOGY REPORT*  Clinical Data: Pulmonary edema versus pneumonia.  CHEST - 2 VIEW  Comparison: 01/22/2012.  Findings: Low volume chest.  Cardiomegaly.  Diffuse airspace opacities remain present.  Worsening of aeration at the right lung base compared to yesterday's examination.  This is probably associated with lower lung volumes and this produced by atelectasis.  IMPRESSION: Worsening pulmonary aeration with lower lung volumes than prior. Diffuse multi focal airspace disease.  The airspace disease is essentially unchanged and may represent pneumonia or asymmetric/atypical pulmonary edema.  Original Report Authenticated By: Andreas Newport, M.D.     Assessment/Plan: Severe c.difficile colitis = continue only with  PO vancomycin 125mg  QID and i will discontinue PR vancomycin for now.  Continue with PO vancomycin 125mg  QID x 10 day.  Asymptomatic bacturia = no need to treat  hcap = finished course of piptazo  Dispo= would have her follow up with PCP in 10days to see if she needs taper of her oral vancomycin if diarrhea still persists.  Will sign off. Call if questions   [name] [name] Infectious Diseases 01/25/2012, 12:57 PM

## 2012-01-26 LAB — GLUCOSE, CAPILLARY: Glucose-Capillary: 150 mg/dL — ABNORMAL HIGH (ref 70–99)

## 2012-01-26 MED ORDER — VALSARTAN 320 MG PO TABS: 320.0000 mg | ORAL_TABLET | Freq: Every day | ORAL | Status: DC

## 2012-01-26 MED ORDER — FAMOTIDINE 40 MG/5ML PO SUSR: 40.0000 mg | Freq: Every day | ORAL | Status: DC

## 2012-01-26 MED ORDER — ALUM & MAG HYDROXIDE-SIMETH 200-200-20 MG/5ML PO SUSP: 15.0000 mL | Freq: Four times a day (QID) | ORAL | Status: AC | PRN

## 2012-01-26 MED ORDER — GUAIFENESIN 100 MG/5ML PO SOLN: 10.0000 mL | ORAL | Status: DC | PRN

## 2012-01-26 MED ORDER — VANCOMYCIN 50 MG/ML ORAL SOLUTION: 125.0000 mg | Freq: Four times a day (QID) | ORAL | Status: DC

## 2012-01-26 NOTE — Evaluation (Signed)
Occupational Therapy Evaluation Patient Details Name: [name] [name] MRN: [id number] DOB: 04/05/1931 Today's Date: 01/26/2012 Time: 4098-1191 OT Time Calculation (min): 21 min  OT Assessment / Plan / Recommendation Clinical Impression  78 yr old femal admitted with ileus, nausea, and vomiting.  Now present with decreased overall independence and safety awareness with selfcare tasks and ADLs.  Will benefit from acute OT services to address these deficits in order for pt to hopefully return home with her son at a supervision/ modified independent level.  Feel pt will need initial 24 hour supervision at discharge secondary to confusion.  If family cannot provide may need SNF for follow-up rehab and supervision.        OT Assessment  Patient needs continued OT Services    Follow Up Recommendations  Skilled nursing facility    Barriers to Discharge Decreased caregiver support    Equipment Recommendations  3 in 1 bedside comode;Tub/shower bench       Frequency  Min 2X/week    Precautions / Restrictions Precautions Precautions: Fall Restrictions Weight Bearing Restrictions: No   Pertinent Vitals/Pain O2 sats 96% on 4ls with HR at 65 decreased to 88% on room air    ADL  Eating/Feeding: Simulated;Independent Where Assessed - Eating/Feeding: Chair Grooming: Performed;Supervision/safety Where Assessed - Grooming: Unsupported standing Upper Body Bathing: Simulated;Set up Where Assessed - Upper Body Bathing: Unsupported sitting Lower Body Bathing: Simulated;Min guard Where Assessed - Lower Body Bathing: Unsupported sit to stand Upper Body Dressing: Set up Where Assessed - Upper Body Dressing: Unsupported sitting Lower Body Dressing: Simulated;Min guard Where Assessed - Lower Body Dressing: Unsupported sit to stand Toilet Transfer: Performed;Min guard Acupuncturist: Comfort height toilet;Grab bars Toileting - Clothing Manipulation and Hygiene: Simulated;Min  guard Where Assessed - Engineer, mining and Hygiene: Sit to stand from 3-in-1 or toilet Tub/Shower Transfer Method: Not assessed Equipment Used: Rolling walker Transfers/Ambulation Related to ADLs: Pt min guard assist to ambulate to the bathroom and to the sink within her room. ADL Comments: Pt's O2 sats decreased to 88% on room air after walk to the bathroom and standing at the sink.      OT Diagnosis: Generalized weakness;Cognitive deficits  OT Problem List: Decreased strength;Decreased activity tolerance;Impaired balance (sitting and/or standing);Decreased knowledge of use of DME or AE;Decreased safety awareness;Decreased cognition;Cardiopulmonary status limiting activity OT Treatment Interventions: Self-care/ADL training;DME and/or AE instruction;Balance training;Patient/family education;Cognitive remediation/compensation;Therapeutic activities   OT Goals Acute Rehab OT Goals OT Goal Formulation: With patient Time For Goal Achievement: 02/09/12 Potential to Achieve Goals: Good ADL Goals Pt Will Perform Grooming: with supervision;Standing at sink ADL Goal: Grooming - Progress: Goal set today Pt Will Perform Lower Body Bathing: with supervision;Sit to stand from chair ADL Goal: Lower Body Bathing - Progress: Goal set today Pt Will Perform Lower Body Dressing: with supervision;Sit to stand from chair;Sit to stand from bed ADL Goal: Lower Body Dressing - Progress: Goal set today Pt Will Transfer to Toilet: with supervision;with DME;3-in-1 ADL Goal: Toilet Transfer - Progress: Goal set today Pt Will Perform Toileting - Clothing Manipulation: with supervision;Sitting on 3-in-1 or toilet;Standing ADL Goal: Toileting - Clothing Manipulation - Progress: Goal set today Pt Will Perform Toileting - Hygiene: with supervision;Sit to stand from 3-in-1/toilet ADL Goal: Toileting - Hygiene - Progress: Goal set today  Visit Information  Last OT Received On: 01/26/12    Subjective  Data  Subjective: I'm at [place]. Patient Stated Goal: Pt want to go back home as soon as possible.   Prior Functioning  Home Living Lives With: Son Available Help at Discharge:  (Pt unsure if anyone else can stay with her) Type of Home: Apartment Home Access: Level entry Home Layout: One level Bathroom Shower/Tub: Engineer, manufacturing systems: Standard Bathroom Accessibility: Yes How Accessible: Accessible via walker Home Adaptive Equipment: Walker - rolling Additional Comments: Pt has been confused and unsure if pt is responding to questions about home living correctly Prior Function Level of Independence: Independent Driving: No Vocation: Retired Musician: No difficulties Dominant Hand: Right    Cognition  Overall Cognitive Status: Impaired Area of Impairment: Memory;Safety/judgement;Problem solving Orientation Level: Disoriented to;Situation;Time Behavior During Session: [facility] for tasks performed Current Attention Level: Sustained Memory Deficits: Pt inconsistent with stating the amount of assistance she will have at discharge.  Also confused about going home today although no MD has stated this yet. Following Commands: Follows one step commands consistently;Follows multi-step commands with increased time Safety/Judgement: Decreased awareness of need for assistance    Extremity/Trunk Assessment Right Upper Extremity Assessment RUE ROM/Strength/Tone: Within functional levels RUE Sensation: WFL - Light Touch RUE Coordination: WFL - gross/fine motor Left Upper Extremity Assessment LUE ROM/Strength/Tone: Within functional levels LUE Sensation: WFL - Light Touch LUE Coordination: WFL - gross/fine motor   Mobility Transfers Transfers: Sit to Stand Sit to Stand: 4: Min guard Stand to Sit: 4: Min guard      Balance Balance Balance Assessed: Yes Static Standing Balance Static Standing - Balance Support: Right upper extremity supported;Left upper  extremity supported Static Standing - Level of Assistance: 5: Stand by assistance Dynamic Standing Balance Dynamic Standing - Balance Support: Right upper extremity supported;Left upper extremity supported Dynamic Standing - Level of Assistance: 4: Min assist  End of Session OT - End of Session Activity Tolerance: Patient tolerated treatment well Patient left: in chair;with call bell/phone within reach;with family/visitor present   Sanna Porcaro 01/26/2012, 1:38 PM

## 2012-01-26 NOTE — Progress Notes (Signed)
Physical Therapy Treatment Patient Details Name: Cozetta Seif MRN: [id number] DOB: 10-17-1930 Today's Date: 01/26/2012 Time: 8469-6295 PT Time Calculation (min): 26 min  PT Assessment / Plan / Recommendation Comments on Treatment Session  Did well this morning. Still will need 24 hour supervision given her cognition. Difficulty following exercise cues without hands on or demonstrative cueing (distractable).     Follow Up Recommendations  Home health PT;Supervision/Assistance - 24 hour    Barriers to Discharge        Equipment Recommendations  None recommended by PT    Recommendations for Other Services    Frequency     Plan Discharge plan remains appropriate;Frequency remains appropriate    Precautions / Restrictions Precautions Precautions: Fall       Mobility  Bed Mobility Supine to Sit: 5: Supervision;With rails;HOB flat Transfers Sit to Stand: 5: Supervision;With upper extremity assist;From bed Stand to Sit: To chair/3-in-1;With upper extremity assist;With armrests;5: Supervision Details for Transfer Assistance: min v/c's for safe technique Ambulation/Gait Ambulation/Gait Assistance: 5: Supervision Ambulation Distance (Feet): 80 Feet Assistive device: Rolling walker Ambulation/Gait Assistance Details: cues for safe technique with RW especially during turns and in tight spaces (pt tends to pick RW up); staggers slightly with head turns, distracted relatively easily Gait Pattern: Step-through pattern;Lateral trunk lean to right    Exercises General Exercises - Lower Extremity Long Arc Quad: AROM;Both;10 reps;Seated Hip Flexion/Marching: AROM;Both;10 reps;Seated Toe Raises: AROM;Both;10 reps;Seated Heel Raises: AROM;Both;10 reps;Seated    PT Goals Acute Rehab PT Goals Pt will go Supine/Side to Sit: with modified independence PT Goal: Supine/Side to Sit - Progress: Progressing toward goal Pt will go Sit to Stand: with modified independence PT Goal: Sit to Stand -  Progress: Progressing toward goal Pt will go Stand to Sit: with modified independence PT Goal: Stand to Sit - Progress: Progressing toward goal Pt will Ambulate: >150 feet;with modified independence;with least restrictive assistive device PT Goal: Ambulate - Progress: Progressing toward goal  Visit Information  Last PT Received On: 01/26/12 Assistance Needed: +1    Subjective Data  Subjective: Im going home at 11 today.    Cognition  Overall Cognitive Status: Impaired Area of Impairment: Attention;Following commands;Awareness of errors Arousal/Alertness: Awake/alert Orientation Level: Appears intact for tasks assessed Behavior During Session: [facility] for tasks performed Current Attention Level: Selective Following Commands: Follows one step commands consistently;Follows multi-step commands inconsistently    Balance     End of Session PT - End of Session Equipment Utilized During Treatment: Gait belt;Oxygen Activity Tolerance: Patient tolerated treatment well Patient left: in chair;with call bell/phone within reach;Other (comment) Radiographer, therapeutic)    Humberto Seals HELEN 01/26/2012, 9:07 AM

## 2012-01-26 NOTE — Progress Notes (Signed)
MEDICARE-CERTIFIED HOME HEALTH AGENCIES [facility]   Agencies that are Medicare-Certified and affiliated with [facility]  Home Health Agency  Telephone Number Address  [facility]  The [facility] has ownership interest in this company; however, you are under no obligation to use this agency. [phone]  [address] [place], [place] [address]    Agencies that are Medicare-Certified and are not affiliated with [facility] Telephone Number Address  Aldine Contes [phone] Fax [phone] [address] [place], [place]  [address]  [facility] [phone] [address] [place], [place] [address]  [facility]  [phone] Fax [phone] [address]  [place], [place]  [address]  [facility] [phone] or [phone] [address] [place], [place] [address]  [facility] [phone] or [phone] [address] [place], [place]  [address]      Agencies that are not Medicare-Certified and are not affiliated with [facility] Agency Telephone Number Address  [facility] [phone] Fax [phone] [address] [place], [place]  [address]  [facility] [phone] or [phone] Fax [phone] [address], [address] [place], [place]  [address]  [facility]  [phone] [address] [place], [place]  [employer] [facility] [phone] Fax [phone] [address] [place], [place]  [address]  [facility] [phone] Fax [phone] [address] [place], [place]  [address]  [facility] [phone] [address] [place], [place]  [address]  [facility] [phone] Fax [phone] [address] [place], [place] [address]  [facility] [phone] [address] [place], [place]  [address]  [facility] [phone] Fax [phone] [address] [place], [place]  [address]  In to see patient to offer choice of home health agencies. [facility]. Appropriate referral will be made.

## 2012-01-26 NOTE — Discharge Summary (Signed)
DISCHARGE SUMMARY  [name] [name]  MR#: [id number]  DOB:1930/12/04  Date of Admission: 01/19/2012 Date of Discharge: 01/26/2012  Attending Physician:Garnie Borchardt  Patient's WUJ:[id number] Minda Meo, MD, MD  Consults: -1. ID CONSULT 2. SURGERY CONSULT 3. PCCM CONSULT  Discharge Diagnoses: Present on Admission:  .Abdominal pain TOXIC MEGACOLON C DIFF COLITIS DIABETES MELLITUS HYPERTENSION .Sepsis .Dehydration .Hypokalemia .Lactic acid acidosis .Elevated troponin .Ileus .Hypothyroidism .Acute respiratory failure with hypoxia .SIRS (systemic inflammatory response syndrome)   Initial presentation:  [name] [name] is an 78 y.o. female with history of diabetes, hypertension, hypothyroidism, hypercholesterolemia, presents to [facility] emergency room with 2 days history of abdominal discomfort, nausea, vomiting food, but no fever, chills, black stool bloody stool.Evaluation in emergency room included leukocytosis with white count of 25.6 thousand, blood glucose of 294, elevated creatinine to 1.73, lactic acid of 4.6, troponin of 0.3 bicarbonate of 21, normal liver function tests, and potassium of 3.0. Her abdominal plain film shows air-fluid levels consistent with ileus, and a right patchy infiltrate in the lung. Abdominal CT confirmed air fluid level which could be consistent with an ileus.General surgery was consulted at [facility] emergency room who did not feel that she has a surgical abdomen, and recommended that she be transferred to [facility]. Pulmonary critical care was consulted as well, by the emergency room physician, and felt that she can be admitted to medicine service for further evaluation and management.     Hospital Course by Problem List: Diffuse peritonitis with megacolon on the right and transverse colon secondary to C diff colitis  Initially when she came in for abdominal pain, CT abdomen showed ileus with diffuse peritonitis and megacolon of the right and  transverse colon. Surgery was consulted, and she was put on IV flagyl and IV zosyn. Surgery suggested possibly colectomy if she doesn't improve with IV antibiotics and bowel rest. PCCM recommended C DIFF pcr evaluation. C diff pcr came positive and she was started on oral vancomycin. She improved dramatically. She was slowly started on feeds and she was able to tolerate a regular diet without any complications. She was discharged on oral vancomycin to complete the course.  Lactic acidosis / Possible sepsis : she was initially worked up for sepsis secondary to elevated LA. She was hydrated with IV fluids. Procalcitonin normal - lactic acid now normalized - serum bicarb normalized - resolved-clinical picture was more consistent with dehydration  Enterococcus urinary tract infect  She was initially worked up for sepsis with a urine culture, it came positive for enterococcus UTI. Pt was already on zosyn and she received 4 days of treatment of zosyn. ID suggested that enterococcus could be colonization as she was asymptomatic and stopped the zosyn.  Acute renal failure : improved, probably pre rena azotemia. Creatinine still elevated at 1.2. Recommend checking BMP in one week at PCP office.  Acute resp failure w/ BLL ASD  Evaluation of the resp failure with a CXR on 5/17 shows worsening pulmonary aeration, , pro bnp is elevated and 2d echo on 5/14 shows left ventricular systolic function normal without any regional wall motion abnormalities. And with grade 1 diastolic dysfunction. Currently she is off oxygen and has good oxygen saturations on room air.  She was on lasix at home, will be restarted on discharge.   +C diff PCR  On vancomycin on discharge to complete the course.  Diabetes Mellitus:   cbg's better controlled.   Hypokalemia  repleted. Hypertension  Home Diovan remains on hold due to recent acute renal  failure. Recommend getting a BMP in one week and restarting the DIOVAN when renal function  improves.  Grade 1 diastolic dysfunction  Was on Lasix at home-as remains on hold due to acute renal failure and soft blood pressure readings, can be restarted on discharge. Hypothyroidism  Cont replacment tx-  Elevated troponin  Likely mild stress induced/hypovelmia induced ischemia - no chest pain - echo reveals no focal WMA and normal EF-was on Plavix prior to admission but no reported history of CAD or stroke. Continue with plavix.  Heartburn: Hold Protonix. And started the patient on pepcid.     Medication List  As of 01/26/2012 12:47 PM   STOP taking these medications         ALEVE 220 MG tablet      pantoprazole 40 MG tablet         TAKE these medications         alum & mag hydroxide-simeth 200-200-20 MG/5ML suspension   Commonly known as: MAALOX/MYLANTA   Take 15 mLs by mouth every 6 (six) hours as needed.      clopidogrel 75 MG tablet   Commonly known as: PLAVIX   Take 75 mg by mouth daily.      cycloSPORINE 0.05 % ophthalmic emulsion   Commonly known as: RESTASIS   Place 1 drop into both eyes 2 (two) times daily.      famotidine 40 MG/5ML suspension   Commonly known as: PEPCID   Take 5 mLs (40 mg total) by mouth daily.      furosemide 20 MG tablet   Commonly known as: LASIX   Take 20 mg by mouth daily.      guaiFENesin 100 MG/5ML Soln   Commonly known as: ROBITUSSIN   Take 10 mLs (200 mg total) by mouth every 4 (four) hours as needed (congestion).      levothyroxine 75 MCG tablet   Commonly known as: SYNTHROID, LEVOTHROID   Take 75 mcg by mouth daily.      metoprolol succinate 25 MG 24 hr tablet   Commonly known as: TOPROL-XL   Take 25 mg by mouth 2 (two) times daily.      mulitivitamin with minerals Tabs   Take 1 tablet by mouth at bedtime.      oxybutynin 10 MG 24 hr tablet   Commonly known as: DITROPAN-XL   Take 10 mg by mouth daily.      simvastatin 20 MG tablet   Commonly known as: ZOCOR   Take 20 mg by mouth every evening.      temazepam  15 MG capsule   Commonly known as: RESTORIL   Take 15 mg by mouth at bedtime as needed. Sleep      valsartan 320 MG tablet   Commonly known as: DIOVAN   Take 1 tablet (320 mg total) by mouth daily.      vancomycin 50 mg/mL oral solution   Commonly known as: VANCOCIN   Take 2.5 mLs (125 mg total) by mouth every 6 (six) hours.             Day of Discharge BP 131/67  Pulse 54  Temp(Src) 97.4 F (36.3 C) (Oral)  Resp 18  Ht 5\' 6"  (1.676 m)  Wt 82.4 kg (181 lb 10.5 oz)  BMI 29.32 kg/m2  SpO2 97%  General: alert afebrile comfortable.  Lungs: mild bibasilar crackles - no wheeze  Cardiovascular: Regular rate and rhythm without murmur gallop or rub  Abdomen: non tender- no  appreciable mass - no rebound, bs+  Extremities: No significant cyanosis, clubbing, or edema bilateral lower extremities   Results for orders placed during the hospital encounter of 01/19/12 (from the past 24 hour(s))  GLUCOSE, CAPILLARY     Status: Abnormal   Collection Time   01/25/12  5:30 PM      Component Value Range   Glucose-Capillary 142 (*) 70 - 99 (mg/dL)   Comment 1 Documented in Chart     Comment 2 Notify RN    GLUCOSE, CAPILLARY     Status: Abnormal   Collection Time   01/25/12  9:24 PM      Component Value Range   Glucose-Capillary 150 (*) 70 - 99 (mg/dL)   Comment 1 Notify RN    GLUCOSE, CAPILLARY     Status: Abnormal   Collection Time   01/26/12  7:49 AM      Component Value Range   Glucose-Capillary 152 (*) 70 - 99 (mg/dL)   Comment 1 Documented in Chart     Comment 2 Notify RN    GLUCOSE, CAPILLARY     Status: Abnormal   Collection Time   01/26/12 12:08 PM      Component Value Range   Glucose-Capillary 152 (*) 70 - 99 (mg/dL)   Comment 1 Documented in Chart     Comment 2 Notify RN      Disposition: Home with Home PT.   Follow-up Appts: Discharge Orders    Future Orders Please Complete By Expires   Diet - low sodium heart healthy      Increase activity slowly       Discharge instructions      Comments:   Follow up with PCP in 1 week. Check BMP in 1 to 2 weeks and restart diovan when renal function improves at a later date by her PCP.        Tests Needing Follow-up: BMP in one week  Time spent in discharge (includes decision making & examination of pt): 68 minutes  Signed: Shiniqua Groseclose 01/26/2012, 12:47 PM

## 2012-01-26 NOTE — Progress Notes (Signed)
Speech Language Pathology Dysphagia Treatment Patient Details Name: [name] [name] MRN: [id number] DOB: [birth year] Today's Date: 01/26/2012 Time: 7829-5621 SLP Time Calculation (min): 12 min  Assessment / Plan / Recommendation Clinical Impression  Patient continues to present with what appears to be a safe and functional oropharyngeal swallow without overt s/s of aspiration noted. Patient does c/o intermittent heart burn which decreases with slow rate. Education complete regarding general reflux precautions including slow rate, following solid with sips of liquids, and upright positioning for at least 30 minutes after meals to increase esophageal clearance of bolus and decrease chances of reflux related aspiration. Patient able to utilize these strategies today with supervision. Will likely need continued reinforcement after d/c given decreased cognitive status (suspect baseline). Current diet remains appropriate due to minimal residual SOB. No further SLP needs indicated at this time. MD, if feel needed in the future to r/o silent aspiration, please order MBS.    Diet Recommendation  Continue with Current Diet: Dysphagia 3 (mechanical soft);Thin liquid    SLP Plan All goals met   Pertinent Vitals/Pain n/a   Swallowing Goals  SLP Swallowing Goals Patient will consume recommended diet without observed clinical signs of aspiration with: Supervision/safety Swallow Study Goal #1 - Progress: Met Patient will utilize recommended strategies during swallow to increase swallowing safety with: Supervision/safety Swallow Study Goal #2 - Progress: Met  General Temperature Spikes Noted: No Respiratory Status: Supplemental O2 delivered via (comment) (4 L nasal cannula) Behavior/Cognition: Alert;Cooperative;Pleasant mood Oral Cavity - Dentition: Missing dentition;Dentures, not available Patient Positioning: Upright in chair    Dysphagia Treatment Treatment focused on: Skilled observation of diet  tolerance;Patient/family/caregiver education Treatment Methods/Modalities: Skilled observation Patient observed directly with PO's: Yes Type of PO's observed: Thin liquids;Dysphagia 3 (soft) Feeding: Able to feed self Liquids provided via: Straw Type of cueing: Verbal Amount of cueing:  (supervision)  Ferdinand Lango MA, CCC-SLP [phone]  Ferdinand Lango Meryl 01/26/2012, 9:47 AM

## 2013-01-06 DIAGNOSIS — A0472 Enterocolitis due to Clostridium difficile, not specified as recurrent: Secondary | ICD-10-CM

## 2013-01-06 HISTORY — DX: Enterocolitis due to Clostridium difficile, not specified as recurrent: A04.72

## 2013-06-14 ENCOUNTER — Encounter (HOSPITAL_COMMUNITY): Payer: Self-pay | Admitting: *Deleted

## 2013-06-14 ENCOUNTER — Emergency Department (HOSPITAL_COMMUNITY)
Admission: EM | Admit: 2013-06-14 | Discharge: 2013-06-14 | Disposition: A | Discharge status: Home / Self Care | Payer: Medicare Other | Attending: Emergency Medicine | Admitting: Emergency Medicine

## 2013-06-14 ENCOUNTER — Emergency Department (HOSPITAL_COMMUNITY): Payer: Medicare Other

## 2013-06-14 DIAGNOSIS — E039 Hypothyroidism, unspecified: Secondary | ICD-10-CM | POA: Insufficient documentation

## 2013-06-14 DIAGNOSIS — I10 Essential (primary) hypertension: Secondary | ICD-10-CM | POA: Insufficient documentation

## 2013-06-14 DIAGNOSIS — Z7902 Long term (current) use of antithrombotics/antiplatelets: Secondary | ICD-10-CM | POA: Insufficient documentation

## 2013-06-14 DIAGNOSIS — R1084 Generalized abdominal pain: Secondary | ICD-10-CM | POA: Insufficient documentation

## 2013-06-14 DIAGNOSIS — Z8619 Personal history of other infectious and parasitic diseases: Secondary | ICD-10-CM | POA: Insufficient documentation

## 2013-06-14 DIAGNOSIS — E119 Type 2 diabetes mellitus without complications: Secondary | ICD-10-CM | POA: Insufficient documentation

## 2013-06-14 DIAGNOSIS — R109 Unspecified abdominal pain: Secondary | ICD-10-CM

## 2013-06-14 DIAGNOSIS — I2510 Atherosclerotic heart disease of native coronary artery without angina pectoris: Secondary | ICD-10-CM | POA: Insufficient documentation

## 2013-06-14 DIAGNOSIS — E876 Hypokalemia: Secondary | ICD-10-CM

## 2013-06-14 DIAGNOSIS — Z8742 Personal history of other diseases of the female genital tract: Secondary | ICD-10-CM | POA: Insufficient documentation

## 2013-06-14 DIAGNOSIS — Z79899 Other long term (current) drug therapy: Secondary | ICD-10-CM | POA: Insufficient documentation

## 2013-06-14 DIAGNOSIS — R300 Dysuria: Secondary | ICD-10-CM | POA: Insufficient documentation

## 2013-06-14 DIAGNOSIS — K219 Gastro-esophageal reflux disease without esophagitis: Secondary | ICD-10-CM | POA: Insufficient documentation

## 2013-06-14 DIAGNOSIS — N289 Disorder of kidney and ureter, unspecified: Secondary | ICD-10-CM

## 2013-06-14 HISTORY — DX: Enterocolitis due to Clostridium difficile, not specified as recurrent: A04.72

## 2013-06-14 HISTORY — DX: Gastro-esophageal reflux disease without esophagitis: K21.9

## 2013-06-14 HISTORY — DX: Diverticulitis of intestine, part unspecified, without perforation or abscess without bleeding: K57.92

## 2013-06-14 HISTORY — DX: Diaphragmatic hernia without obstruction or gangrene: K44.9

## 2013-06-14 HISTORY — DX: Atherosclerotic heart disease of native coronary artery without angina pectoris: I25.10

## 2013-06-14 HISTORY — DX: Hypothyroidism, unspecified: E03.9

## 2013-06-14 HISTORY — DX: Leiomyoma of uterus, unspecified: D25.9

## 2013-06-14 HISTORY — DX: Disorder of kidney and ureter, unspecified: N28.9

## 2013-06-14 HISTORY — DX: Atherosclerosis of renal artery: I70.1

## 2013-06-14 LAB — URINALYSIS W MICROSCOPIC + REFLEX CULTURE
Glucose, UA: 250 mg/dL — AB
Ketones, ur: NEGATIVE mg/dL
Leukocytes, UA: NEGATIVE
Nitrite: NEGATIVE
Protein, ur: NEGATIVE mg/dL
pH: 6 (ref 5.0–8.0)

## 2013-06-14 LAB — CBC WITH DIFFERENTIAL/PLATELET
Basophils Absolute: 0 10*3/uL (ref 0.0–0.1)
Basophils Relative: 0 % (ref 0–1)
Eosinophils Absolute: 0.1 10*3/uL (ref 0.0–0.7)
Eosinophils Relative: 1 % (ref 0–5)
HCT: 40.5 % (ref 36.0–46.0)
Lymphocytes Relative: 12 % (ref 12–46)
Lymphs Abs: 1.2 10*3/uL (ref 0.7–4.0)
MCHC: 34.1 g/dL (ref 30.0–36.0)
MCV: 89 fL (ref 78.0–100.0)
Monocytes Absolute: 0.5 10*3/uL (ref 0.1–1.0)
Platelets: 202 10*3/uL (ref 150–400)
RBC: 4.55 MIL/uL (ref 3.87–5.11)
RDW: 14.2 % (ref 11.5–15.5)
WBC: 10.4 10*3/uL (ref 4.0–10.5)

## 2013-06-14 LAB — COMPREHENSIVE METABOLIC PANEL
ALT: 11 U/L (ref 0–35)
AST: 23 U/L (ref 0–37)
Albumin: 3.7 g/dL (ref 3.5–5.2)
CO2: 28 mEq/L (ref 19–32)
Calcium: 8.5 mg/dL (ref 8.4–10.5)
Creatinine, Ser: 1.78 mg/dL — ABNORMAL HIGH (ref 0.50–1.10)
GFR calc non Af Amer: 26 mL/min — ABNORMAL LOW (ref >90)
Sodium: 136 mEq/L (ref 135–145)
Total Protein: 8.1 g/dL (ref 6.0–8.3)

## 2013-06-14 LAB — LACTIC ACID, PLASMA: Lactic Acid, Venous: 1.9 mmol/L (ref 0.5–2.2)

## 2013-06-14 MED ORDER — IOHEXOL 300 MG/ML  SOLN
50.0000 mL | Freq: Once | INTRAMUSCULAR | Status: AC | PRN
  Administered 2013-06-14: 50 mL via ORAL

## 2013-06-14 MED ORDER — FENTANYL CITRATE 0.05 MG/ML IJ SOLN
50.0000 ug | INTRAMUSCULAR | Status: AC | PRN
  Administered 2013-06-14 (×2): 50 ug via INTRAVENOUS
  Filled 2013-06-14 (×2): qty 2

## 2013-06-14 MED ORDER — SODIUM CHLORIDE 0.9 % IV SOLN
INTRAVENOUS | Status: DC
  Administered 2013-06-14: 15:00:00 via INTRAVENOUS

## 2013-06-14 MED ORDER — SODIUM CHLORIDE 0.9 % IV BOLUS (SEPSIS)
500.0000 mL | Freq: Once | INTRAVENOUS | Status: AC
  Administered 2013-06-14: 500 mL via INTRAVENOUS

## 2013-06-14 MED ORDER — POTASSIUM CHLORIDE 20 MEQ/15ML (10%) PO LIQD
40.0000 meq | Freq: Once | ORAL | Status: AC
  Administered 2013-06-14: 40 meq via ORAL
  Filled 2013-06-14: qty 30

## 2013-06-14 NOTE — ED Notes (Signed)
ED Tech entered room to find that patient had climbed out of stretcher and went to the bathroom.  Returned to Doctor, general practice.

## 2013-06-14 NOTE — ED Provider Notes (Signed)
CSN: [id number]     Arrival date & time 06/14/13  1256 History   First MD Initiated Contact with Patient 06/14/13 1310     Chief Complaint  Patient presents with  . Urinary Retention    HPI Pt was seen at 1405.  Per pt, c/o gradual onset and persistence of constant generalized abd "pain" for the past 4 days.  Has been associated dysuria and "decreased urination."  Describes the abd pain as "cramping."  Denies N/V/D, no fevers, no back pain, no rash, no CP/SOB, no black or blood in stools.       Past Medical History  Diagnosis Date  . Diabetes mellitus   . Hypertension   . Clostridium difficile colitis 01/2013  . Hiatal hernia   . Renal insufficiency   . Hypothyroid   . Left renal artery stenosis   . GERD (gastroesophageal reflux disease)   . Coronary artery disease   . Diverticulitis   . Uterine fibroid    Past Surgical History  Procedure Laterality Date  . Renal artery stent Left     History  Substance Use Topics  . Smoking status: Never Smoker   . Smokeless tobacco: Not on file  . Alcohol Use: No    Review of Systems ROS: Statement: All systems negative except as marked or noted in the HPI; Constitutional: Negative for fever and chills. ; ; Eyes: Negative for eye pain, redness and discharge. ; ; ENMT: Negative for ear pain, hoarseness, nasal congestion, sinus pressure and sore throat. ; ; Cardiovascular: Negative for chest pain, palpitations, diaphoresis, dyspnea and peripheral edema. ; ; Respiratory: Negative for cough, wheezing and stridor. ; ; Gastrointestinal: +abd pain. Negative for nausea, vomiting, diarrhea, blood in stool, hematemesis, jaundice and rectal bleeding. . ; ; Genitourinary: +dysuria, decreased urination. Negative for flank pain and hematuria. ; ; Musculoskeletal: Negative for back pain and neck pain. Negative for swelling and trauma.; ; Skin: Negative for pruritus, rash, abrasions, blisters, bruising and skin lesion.; ; Neuro: Negative for headache,  lightheadedness and neck stiffness. Negative for weakness, altered level of consciousness , altered mental status, extremity weakness, paresthesias, involuntary movement, seizure and syncope.       Allergies  Review of patient's allergies indicates no known allergies.  Home Medications   Current Outpatient Rx  Name  Route  Sig  Dispense  Refill  . amLODipine (NORVASC) 5 MG tablet   Oral   Take 5 mg by mouth daily.         . clopidogrel (PLAVIX) 75 MG tablet   Oral   Take 75 mg by mouth daily.         . cycloSPORINE (RESTASIS) 0.05 % ophthalmic emulsion   Both Eyes   Place 1 drop into both eyes 2 (two) times daily.         Marland Kitchen donepezil (ARICEPT) 5 MG tablet   Oral   Take 5 mg by mouth daily.         . furosemide (LASIX) 40 MG tablet   Oral   Take 40 mg by mouth 2 (two) times daily.         Marland Kitchen glimepiride (AMARYL) 2 MG tablet   Oral   Take 2 mg by mouth daily.         Marland Kitchen lactulose (CHRONULAC) 10 GM/15ML solution   Oral   Take 10 g by mouth 2 (two) times daily.         Marland Kitchen levothyroxine (SYNTHROID, LEVOTHROID) 100 MCG tablet  Oral   Take 100 mcg by mouth daily.         . metoprolol succinate (TOPROL-XL) 25 MG 24 hr tablet   Oral   Take 25 mg by mouth 2 (two) times daily.         . Multiple Vitamin (MULITIVITAMIN WITH MINERALS) TABS   Oral   Take 1 tablet by mouth at bedtime.         Marland Kitchen oxybutynin (DITROPAN-XL) 10 MG 24 hr tablet   Oral   Take 10 mg by mouth daily.         . pantoprazole (PROTONIX) 40 MG tablet   Oral   Take 40 mg by mouth 2 (two) times daily.         . simvastatin (ZOCOR) 20 MG tablet   Oral   Take 20 mg by mouth every evening.         . temazepam (RESTORIL) 15 MG capsule   Oral   Take 15 mg by mouth at bedtime as needed. Sleep         . valsartan (DIOVAN) 80 MG tablet   Oral   Take 80 mg by mouth daily.          BP 163/91  Pulse 92  Temp(Src) 97.4 F (36.3 C) (Oral)  Resp 18  Ht 5\' 4"  (1.626 m)  Wt  145 lb (65.772 kg)  BMI 24.88 kg/m2  SpO2 99% Physical Exam 1410: Physical examination:  Nursing notes reviewed; Vital signs and O2 SAT reviewed;  Constitutional: Well developed, Well nourished, In no acute distress; Head:  Normocephalic, atraumatic; Eyes: EOMI, PERRL, No scleral icterus; ENMT: Mouth and pharynx normal, Mucous membranes dry; Neck: Supple, Full range of motion, No lymphadenopathy; Cardiovascular: Regular rate and rhythm, No gallop; Respiratory: Breath sounds clear & equal bilaterally, No wheezes.  Speaking full sentences with ease, Normal respiratory effort/excursion; Chest: Nontender, Movement normal; Abdomen: Soft, +mild diffuse tenderness to palp. No rebound or guarding. Nondistended, Normal bowel sounds; Genitourinary: No CVA tenderness; Extremities: Pulses normal, No tenderness, No edema, No calf edema or asymmetry.; Neuro: AA&Ox3, vague historian. Major CN grossly intact.  Speech clear. No gross focal motor or sensory deficits in extremities.; Skin: Color normal, Warm, Dry.   ED Course  Procedures   1415:  Straight cath urine with approx drained from bladder, per ED RN and Tech.   1900:  Pt re-evaluated: pt is currently climbing on and off the stretcher on her own and walking to the bathroom in her room. Family states she is walking with her usual baseline gait. Pt herself denies any complaints. States she feels better and wants to go home now.  Pt and family both state pt has urinated several times on her own. Pt denies any urinary retention. Neuro exam remains unchanged. VSS, resps easy, abd soft/NT. Potassium has been repleted PO. Pt has tol PO well without N/V while in the ED. BUN/Cr mildly elevated, but pt has hx of renal insufficiency. Offered admission but pt's family states they would like to take pt home now and pt herself wants to leave now.  Strongly encouraged to f/u with PMD in the next 1 to 2 days for BUN/Cr/potassium recheck. Dx and testing d/w pt and family.   Questions answered.  Verb understanding, agreeable to d/c home with outpt f/u.    MDM  MDM Reviewed: previous chart, nursing note and vitals Reviewed previous: labs and CT scan Interpretation: labs, x-ray and CT scan   Results for orders  placed during the hospital encounter of 06/14/13  URINALYSIS W MICROSCOPIC + REFLEX CULTURE      Result Value Range   Color, Urine YELLOW  YELLOW   APPearance CLEAR  CLEAR   Specific Gravity, Urine <1.005 (*) 1.005 - 1.030   pH 6.0  5.0 - 8.0   Glucose, UA 250 (*) NEGATIVE mg/dL   Hgb urine dipstick NEGATIVE  NEGATIVE   Bilirubin Urine NEGATIVE  NEGATIVE   Ketones, ur NEGATIVE  NEGATIVE mg/dL   Protein, ur NEGATIVE  NEGATIVE mg/dL   Urobilinogen, UA 0.2  0.0 - 1.0 mg/dL   Nitrite NEGATIVE  NEGATIVE   Leukocytes, UA NEGATIVE  NEGATIVE   WBC, UA 0-2  <3 WBC/hpf   Bacteria, UA RARE  RARE   Squamous Epithelial / LPF FEW (*) RARE  CBC WITH DIFFERENTIAL      Result Value Range   WBC 10.4  4.0 - 10.5 K/uL   RBC 4.55  3.87 - 5.11 MIL/uL   Hemoglobin 13.8  12.0 - 15.0 g/dL   HCT 16.1  09.6 - 04.5 %   MCV 89.0  78.0 - 100.0 fL   MCH 30.3  26.0 - 34.0 pg   MCHC 34.1  30.0 - 36.0 g/dL   RDW 40.9  81.1 - 91.4 %   Platelets 202  150 - 400 K/uL   Neutrophils Relative % 82 (*) 43 - 77 %   Neutro Abs 8.5 (*) 1.7 - 7.7 K/uL   Lymphocytes Relative 12  12 - 46 %   Lymphs Abs 1.2  0.7 - 4.0 K/uL   Monocytes Relative 5  3 - 12 %   Monocytes Absolute 0.5  0.1 - 1.0 K/uL   Eosinophils Relative 1  0 - 5 %   Eosinophils Absolute 0.1  0.0 - 0.7 K/uL   Basophils Relative 0  0 - 1 %   Basophils Absolute 0.0  0.0 - 0.1 K/uL  COMPREHENSIVE METABOLIC PANEL      Result Value Range   Sodium 136  135 - 145 mEq/L   Potassium 2.9 (*) 3.5 - 5.1 mEq/L   Chloride 92 (*) 96 - 112 mEq/L   CO2 28  19 - 32 mEq/L   Glucose, Bld 272 (*) 70 - 99 mg/dL   BUN 30 (*) 6 - 23 mg/dL   Creatinine, Ser 7.82 (*) 0.50 - 1.10 mg/dL   Calcium 8.5  8.4 - 95.6 mg/dL   Total Protein  8.1  6.0 - 8.3 g/dL   Albumin 3.7  3.5 - 5.2 g/dL   AST 23  0 - 37 U/L   ALT 11  0 - 35 U/L   Alkaline Phosphatase 84  39 - 117 U/L   Total Bilirubin 0.6  0.3 - 1.2 mg/dL   GFR calc non Af Amer 26 (*) >90 mL/min   GFR calc Af Amer 30 (*) >90 mL/min  LIPASE, BLOOD      Result Value Range   Lipase 41  11 - 59 U/L  LACTIC ACID, PLASMA      Result Value Range   Lactic Acid, Venous 1.9  0.5 - 2.2 mmol/L   Ct Abdomen Pelvis Wo Contrast 06/14/2013   CLINICAL DATA:  Abdominal pain with difficulty urinating. Diabetes. Hypertension. Renal insufficiency.  EXAM: CT ABDOMEN AND PELVIS WITHOUT CONTRAST  TECHNIQUE: Multidetector CT imaging of the abdomen and pelvis was performed following the standard protocol without intravenous contrast.  COMPARISON:  01/19/2012  FINDINGS: There is atelectasis medially  in both lower lobes, improved from last year. Moderate-sized hiatal hernia noted.  The noncontrast CT appearance of the liver, spleen, pancreas, and adrenal glands is within normal limits. No specific gallbladder or biliary abnormality identified. No pathologic upper abdominal adenopathy is observed. Appendix normal.  Left renal artery stent noted.  Kidneys and proximal ureters unremarkable. There are calcifications in the immediate vicinity of both distal ureters but these appear unchanged from last year's exam and accordingly are thought to be vascular rather than ureteral.  Aortoiliac atherosclerotic vascular disease noted.  Stable uterine contour indicating fundal fibroid.  Bridging spurring of the right sacroiliac joint noted.  Lower lumbar facet arthropathy noted.  IMPRESSION: 1. A specific cause for the patient's abdominal pain is not observed. 2. Moderate-sized hiatal hernia. Contrast in the distal esophagus favors gastroesophageal reflux. 3. Mild atelectasis medially in both lower lobes. 4. Atherosclerosis. 5. Uterine fibroid.   Electronically Signed   By: Herbie Baltimore M.D.   On: 06/14/2013 17:26   Dg  Chest 2 View 06/14/2013   CLINICAL DATA:  Abdominal pain, shortness of breath, diabetes, hypertension, coronary artery disease  EXAM: CHEST  2 VIEW  COMPARISON:  01/23/2012  FINDINGS: Enlargement of cardiac silhouette.  Mediastinal contours and pulmonary vascularity normal.  Minimal atelectasis or scarring at left base.  No definite infiltrate, pleural effusion or pneumothorax.  Significantly improved aeration versus previous exam.  Bones demineralized.  IMPRESSION: No acute abnormalities.  Enlargement of cardiac silhouette with minimal atelectasis or scarring at left base.   Electronically Signed   By: Ulyses Southward M.D.   On: 06/14/2013 15:52    Results for JOSELYN, EDLING (MRN [id number]) as of 06/14/2013 19:21  Ref. Range 01/19/2012 14:26 01/20/2012 05:50 01/25/2012 09:08 06/14/2013 14:39  BUN Latest Range: 6-23 mg/dL 30 (H) 39 (H) 20 30 (H)  Creatinine Latest Range: 0.50-1.10 mg/dL 0.10 (H) 2.72 (H) 5.36 (H) 1.78 (H)       Laray Anger, DO 06/17/13 1410

## 2013-06-14 NOTE — ED Notes (Signed)
Pt couldn't stand long enough for bp during orthos. Pt complains of stomach pain and nausea, slightly dizzy while standing

## 2013-06-14 NOTE — ED Notes (Addendum)
Has not urinated since Sunday, dribbled some yesterday w/brown urine.  Had burning and stinging.  Last BM yesterday.

## 2013-09-13 ENCOUNTER — Encounter (HOSPITAL_COMMUNITY): Payer: Self-pay | Admitting: Emergency Medicine

## 2013-09-13 ENCOUNTER — Emergency Department (HOSPITAL_COMMUNITY)
Admission: EM | Admit: 2013-09-13 | Discharge: 2013-09-13 | Disposition: A | Discharge status: Home / Self Care | Payer: Medicare Other | Attending: Emergency Medicine | Admitting: Emergency Medicine

## 2013-09-13 DIAGNOSIS — K219 Gastro-esophageal reflux disease without esophagitis: Secondary | ICD-10-CM | POA: Insufficient documentation

## 2013-09-13 DIAGNOSIS — Z79899 Other long term (current) drug therapy: Secondary | ICD-10-CM | POA: Insufficient documentation

## 2013-09-13 DIAGNOSIS — L299 Pruritus, unspecified: Secondary | ICD-10-CM | POA: Insufficient documentation

## 2013-09-13 DIAGNOSIS — I10 Essential (primary) hypertension: Secondary | ICD-10-CM | POA: Insufficient documentation

## 2013-09-13 DIAGNOSIS — R21 Rash and other nonspecific skin eruption: Secondary | ICD-10-CM

## 2013-09-13 DIAGNOSIS — E119 Type 2 diabetes mellitus without complications: Secondary | ICD-10-CM | POA: Insufficient documentation

## 2013-09-13 DIAGNOSIS — I2510 Atherosclerotic heart disease of native coronary artery without angina pectoris: Secondary | ICD-10-CM | POA: Insufficient documentation

## 2013-09-13 DIAGNOSIS — Z8619 Personal history of other infectious and parasitic diseases: Secondary | ICD-10-CM | POA: Insufficient documentation

## 2013-09-13 DIAGNOSIS — Z7902 Long term (current) use of antithrombotics/antiplatelets: Secondary | ICD-10-CM | POA: Insufficient documentation

## 2013-09-13 DIAGNOSIS — Z87448 Personal history of other diseases of urinary system: Secondary | ICD-10-CM | POA: Insufficient documentation

## 2013-09-13 DIAGNOSIS — E039 Hypothyroidism, unspecified: Secondary | ICD-10-CM | POA: Insufficient documentation

## 2013-09-13 DIAGNOSIS — Z8742 Personal history of other diseases of the female genital tract: Secondary | ICD-10-CM | POA: Insufficient documentation

## 2013-09-13 HISTORY — DX: Zoster without complications: B02.9

## 2013-09-13 LAB — GLUCOSE, CAPILLARY: Glucose-Capillary: 108 mg/dL — ABNORMAL HIGH (ref 70–99)

## 2013-09-13 MED ORDER — DIPHENHYDRAMINE HCL 12.5 MG/5ML PO ELIX
12.5000 mg | ORAL_SOLUTION | Freq: Once | ORAL | Status: AC
  Administered 2013-09-13: 12.5 mg via ORAL
  Filled 2013-09-13: qty 5

## 2013-09-13 MED ORDER — DIPHENHYDRAMINE HCL 12.5 MG/5ML PO ELIX: 12.5000 mg | ORAL_SOLUTION | Freq: Four times a day (QID) | ORAL | Status: DC | PRN

## 2013-09-13 MED ORDER — PREDNISONE 20 MG PO TABS
40.0000 mg | ORAL_TABLET | Freq: Once | ORAL | Status: AC
  Administered 2013-09-13: 40 mg via ORAL
  Filled 2013-09-13: qty 2

## 2013-09-13 MED ORDER — PREDNISONE 10 MG PO TABS: ORAL_TABLET | ORAL | Status: DC

## 2013-09-13 MED ORDER — PREDNISONE 50 MG PO TABS: 60.0000 mg | ORAL_TABLET | Freq: Once | ORAL | Status: DC

## 2013-09-13 NOTE — Discharge Instructions (Signed)
Rash A rash is a change in the color or texture of your skin. There are many different types of rashes. You may have other problems that accompany your rash. CAUSES   Infections.  Allergic reactions. This can include allergies to pets or foods.  Certain medicines.  Exposure to certain chemicals, soaps, or cosmetics.  Heat.  Exposure to poisonous plants.  Tumors, both cancerous and noncancerous. SYMPTOMS   Redness.  Scaly skin.  Itchy skin.  Dry or cracked skin.  Bumps.  Blisters.  Pain. DIAGNOSIS  Your caregiver may do a physical exam to determine what type of rash you have. A skin sample (biopsy) may be taken and examined under a microscope. TREATMENT  Treatment depends on the type of rash you have. Your caregiver may prescribe certain medicines. For serious conditions, you may need to see a skin doctor (dermatologist). HOME CARE INSTRUCTIONS   Avoid the substance that caused your rash.  Do not scratch your rash. This can cause infection.  You may take cool baths to help stop itching.  Only take over-the-counter or prescription medicines as directed by your caregiver.  Keep all follow-up appointments as directed by your caregiver. SEEK IMMEDIATE MEDICAL CARE IF:  You have increasing pain, swelling, or redness.  You have a fever.  You have new or severe symptoms.  You have body aches, diarrhea, or vomiting.  Your rash is not better after 3 days. MAKE SURE YOU:  Understand these instructions.  Will watch your condition.  Will get help right away if you are not doing well or get worse. Document Released: 08/15/2002 Document Revised: 11/17/2011 Document Reviewed: 06/09/2011 [facility] Patient Information 2014 [place], [place].   Take your next dose of prednisone tomorrow with your supper.  Use caution with benadryl as it can make you sleepy.  Make sure you keep a watch on your blood glucose level while on prednisone as this can elevate your blood sugar,  but will probably no alter it much given the short course you are being placed on.

## 2013-09-13 NOTE — ED Notes (Signed)
Pt reports rash to back area since Sunday.  Also has a area that appears to be a mole on her left flank area that she would like looked at. Also is "itching all over" has taken no otc meds.

## 2013-09-15 NOTE — ED Provider Notes (Signed)
CSN: [id number]     Arrival date & time 09/13/13  1440 History   First MD Initiated Contact with Patient 09/13/13 1632     Chief Complaint  Patient presents with  . Rash  . Pruritis   (Consider location/radiation/quality/duration/timing/severity/associated sxs/prior Treatment) Patient is a 80 y.o. female presenting with rash. The history is provided by the patient and a relative.  Rash Location:  Torso Torso rash location:  Upper back, lower back, L flank and R flank Quality: itchiness   Quality: not blistering, not burning, not draining, not painful, not peeling, not red, not scaling, not swelling and not weeping   Severity:  Moderate Onset quality:  Gradual Duration:  2 days Timing:  Constant Progression:  Unchanged Chronicity:  New Context: not animal contact, not chemical exposure, not exposure to similar rash, not food, not insect bite/sting, not medications, not new detergent/soap, not plant contact and not sun exposure   Relieved by:  None tried Worsened by:  Nothing tried Ineffective treatments:  None tried Associated symptoms: no fever, no shortness of breath and not wheezing   Associated symptoms comment:  She also reports a lesion on her left flank which developed 2 days ago.   Past Medical History  Diagnosis Date  . Diabetes mellitus   . Hypertension   . Clostridium difficile colitis 01/2013  . Hiatal hernia   . Renal insufficiency   . Hypothyroid   . Left renal artery stenosis   . GERD (gastroesophageal reflux disease)   . Coronary artery disease   . Diverticulitis   . Uterine fibroid   . Shingles    Past Surgical History  Procedure Laterality Date  . Renal artery stent Left    No family history on file. History  Substance Use Topics  . Smoking status: Never Smoker   . Smokeless tobacco: Not on file  . Alcohol Use: No   OB History   Grav Para Term Preterm Abortions TAB SAB Ect Mult Living                 Review of Systems  Constitutional:  Negative for fever and chills.  HENT: Negative for facial swelling.   Respiratory: Negative for shortness of breath and wheezing.   Skin: Positive for rash.  Neurological: Negative for numbness.    Allergies  Review of patient's allergies indicates no known allergies.  Home Medications   Current Outpatient Rx  Name  Route  Sig  Dispense  Refill  . amLODipine (NORVASC) 5 MG tablet   Oral   Take 5 mg by mouth daily.         . clopidogrel (PLAVIX) 75 MG tablet   Oral   Take 75 mg by mouth daily.         . cycloSPORINE (RESTASIS) 0.05 % ophthalmic emulsion   Both Eyes   Place 1 drop into both eyes 2 (two) times daily.         Marland Kitchen donepezil (ARICEPT) 5 MG tablet   Oral   Take 5 mg by mouth daily.         . furosemide (LASIX) 40 MG tablet   Oral   Take 40 mg by mouth 2 (two) times daily.         Marland Kitchen glimepiride (AMARYL) 2 MG tablet   Oral   Take 2 mg by mouth daily.         Marland Kitchen lactulose (CHRONULAC) 10 GM/15ML solution   Oral   Take 10 g by mouth  2 (two) times daily.         Marland Kitchen levothyroxine (SYNTHROID, LEVOTHROID) 100 MCG tablet   Oral   Take 100 mcg by mouth daily.         . Multiple Vitamin (MULITIVITAMIN WITH MINERALS) TABS   Oral   Take 1 tablet by mouth at bedtime.         Marland Kitchen oxybutynin (DITROPAN-XL) 10 MG 24 hr tablet   Oral   Take 10 mg by mouth daily.         . pantoprazole (PROTONIX) 40 MG tablet   Oral   Take 40 mg by mouth 2 (two) times daily.         . simvastatin (ZOCOR) 20 MG tablet   Oral   Take 20 mg by mouth every evening.         . temazepam (RESTORIL) 15 MG capsule   Oral   Take 15 mg by mouth at bedtime as needed. Sleep         . valsartan (DIOVAN) 80 MG tablet   Oral   Take 80 mg by mouth daily.         . diphenhydrAMINE (BENADRYL) 12.5 MG/5ML elixir   Oral   Take 5 mLs (12.5 mg total) by mouth every 6 (six) hours as needed for itching.   120 mL   0   . metoprolol succinate (TOPROL-XL) 25 MG 24 hr tablet    Oral   Take 25 mg by mouth 2 (two) times daily.         . predniSONE (DELTASONE) 10 MG tablet      5, 4, 3, 2 then 1 tablet by mouth daily for 5 days total.   15 tablet   0    BP 133/89  Pulse 88  Temp(Src) 97.9 F (36.6 C) (Oral)  Resp 16  Ht 5\' 4"  (1.626 m)  Wt 140 lb (63.504 kg)  BMI 24.02 kg/m2  SpO2 98% Physical Exam  Constitutional: She appears well-developed and well-nourished. No distress.  HENT:  Head: Normocephalic.  Neck: Neck supple.  Cardiovascular: Normal rate.   Pulmonary/Chest: Effort normal. She has no wheezes.  Musculoskeletal: Normal range of motion. She exhibits no edema.  Skin: Rash noted. No erythema.  Normal appearing skin without rash, but with excoriations on flanks.  Small skin tag left flank with dark scabbing.  No erythema. Skin appears healthy, not dry, no rash.    ED Course  Procedures (including critical care time) Labs Review Labs Reviewed  GLUCOSE, CAPILLARY - Abnormal; Notable for the following:    Glucose-Capillary 108 (*)    All other components within normal limits   Imaging Review No results found.  EKG Interpretation   None       MDM   1. Rash     Itching of unclear etiology. She has no recognized new exposures, no new lotions, soaps, medications.  Pt was given benadryl (12.5 mg) for itch, short prednisone taper.  Encouraged she f/u with pcp if sx persist.     Evalee Jefferson, PA-C 09/15/13 1401

## 2013-09-19 NOTE — ED Provider Notes (Signed)
Medical screening examination/treatment/procedure(s) were performed by non-physician practitioner and as supervising physician I was immediately available for consultation/collaboration.  EKG Interpretation   None         Mervin Kung, MD 09/19/13 1314

## 2014-03-16 ENCOUNTER — Encounter (HOSPITAL_COMMUNITY): Payer: Self-pay | Admitting: Emergency Medicine

## 2014-03-16 ENCOUNTER — Emergency Department (HOSPITAL_COMMUNITY): Payer: Medicare Other

## 2014-03-16 ENCOUNTER — Observation Stay (HOSPITAL_COMMUNITY)
Admission: EM | Admit: 2014-03-16 | Discharge: 2014-03-18 | Disposition: A | Discharge status: Home / Self Care | Payer: Medicare Other | Attending: Family Medicine | Admitting: Family Medicine

## 2014-03-16 ENCOUNTER — Inpatient Hospital Stay (HOSPITAL_COMMUNITY): Payer: Medicare Other

## 2014-03-16 DIAGNOSIS — K449 Diaphragmatic hernia without obstruction or gangrene: Secondary | ICD-10-CM | POA: Insufficient documentation

## 2014-03-16 DIAGNOSIS — Z79899 Other long term (current) drug therapy: Secondary | ICD-10-CM | POA: Insufficient documentation

## 2014-03-16 DIAGNOSIS — Z8742 Personal history of other diseases of the female genital tract: Secondary | ICD-10-CM | POA: Diagnosis not present

## 2014-03-16 DIAGNOSIS — R079 Chest pain, unspecified: Secondary | ICD-10-CM | POA: Diagnosis present

## 2014-03-16 DIAGNOSIS — N289 Disorder of kidney and ureter, unspecified: Secondary | ICD-10-CM | POA: Insufficient documentation

## 2014-03-16 DIAGNOSIS — E119 Type 2 diabetes mellitus without complications: Secondary | ICD-10-CM | POA: Diagnosis not present

## 2014-03-16 DIAGNOSIS — I701 Atherosclerosis of renal artery: Secondary | ICD-10-CM | POA: Diagnosis not present

## 2014-03-16 DIAGNOSIS — K5732 Diverticulitis of large intestine without perforation or abscess without bleeding: Secondary | ICD-10-CM | POA: Diagnosis not present

## 2014-03-16 DIAGNOSIS — Z7902 Long term (current) use of antithrombotics/antiplatelets: Secondary | ICD-10-CM | POA: Insufficient documentation

## 2014-03-16 DIAGNOSIS — R0609 Other forms of dyspnea: Secondary | ICD-10-CM

## 2014-03-16 DIAGNOSIS — N189 Chronic kidney disease, unspecified: Secondary | ICD-10-CM | POA: Diagnosis present

## 2014-03-16 DIAGNOSIS — K219 Gastro-esophageal reflux disease without esophagitis: Secondary | ICD-10-CM | POA: Diagnosis not present

## 2014-03-16 DIAGNOSIS — R609 Edema, unspecified: Secondary | ICD-10-CM | POA: Insufficient documentation

## 2014-03-16 DIAGNOSIS — M79609 Pain in unspecified limb: Secondary | ICD-10-CM

## 2014-03-16 DIAGNOSIS — I519 Heart disease, unspecified: Secondary | ICD-10-CM | POA: Insufficient documentation

## 2014-03-16 DIAGNOSIS — I10 Essential (primary) hypertension: Secondary | ICD-10-CM | POA: Diagnosis not present

## 2014-03-16 DIAGNOSIS — E039 Hypothyroidism, unspecified: Secondary | ICD-10-CM | POA: Insufficient documentation

## 2014-03-16 DIAGNOSIS — E876 Hypokalemia: Secondary | ICD-10-CM | POA: Insufficient documentation

## 2014-03-16 DIAGNOSIS — M79672 Pain in left foot: Secondary | ICD-10-CM

## 2014-03-16 DIAGNOSIS — I2510 Atherosclerotic heart disease of native coronary artery without angina pectoris: Secondary | ICD-10-CM | POA: Insufficient documentation

## 2014-03-16 DIAGNOSIS — R0989 Other specified symptoms and signs involving the circulatory and respiratory systems: Secondary | ICD-10-CM

## 2014-03-16 DIAGNOSIS — Z8619 Personal history of other infectious and parasitic diseases: Secondary | ICD-10-CM | POA: Insufficient documentation

## 2014-03-16 DIAGNOSIS — R0789 Other chest pain: Secondary | ICD-10-CM | POA: Diagnosis not present

## 2014-03-16 HISTORY — DX: Other ill-defined heart diseases: I51.89

## 2014-03-16 LAB — COMPREHENSIVE METABOLIC PANEL
ALBUMIN: 3.3 g/dL — AB (ref 3.5–5.2)
ALT: <5 U/L (ref 0–35)
ANION GAP: 15 (ref 5–15)
AST: 11 U/L (ref 0–37)
Alkaline Phosphatase: 59 U/L (ref 39–117)
BUN: 25 mg/dL — AB (ref 6–23)
CO2: 26 mEq/L (ref 19–32)
CREATININE: 1.74 mg/dL — AB (ref 0.50–1.10)
Calcium: 8.3 mg/dL — ABNORMAL LOW (ref 8.4–10.5)
Chloride: 99 mEq/L (ref 96–112)
GFR calc Af Amer: 30 mL/min — ABNORMAL LOW (ref >90)
GFR calc non Af Amer: 26 mL/min — ABNORMAL LOW (ref >90)
Glucose, Bld: 134 mg/dL — ABNORMAL HIGH (ref 70–99)
Potassium: 3 mEq/L — ABNORMAL LOW (ref 3.7–5.3)
Sodium: 140 mEq/L (ref 137–147)
TOTAL PROTEIN: 7.1 g/dL (ref 6.0–8.3)
Total Bilirubin: 1.1 mg/dL (ref 0.3–1.2)

## 2014-03-16 LAB — CBC WITH DIFFERENTIAL/PLATELET
BASOS PCT: 0 % (ref 0–1)
Basophils Absolute: 0 10*3/uL (ref 0.0–0.1)
EOS ABS: 0 10*3/uL (ref 0.0–0.7)
Eosinophils Relative: 0 % (ref 0–5)
HEMATOCRIT: 37.1 % (ref 36.0–46.0)
HEMOGLOBIN: 12.6 g/dL (ref 12.0–15.0)
Lymphocytes Relative: 11 % — ABNORMAL LOW (ref 12–46)
Lymphs Abs: 1.5 10*3/uL (ref 0.7–4.0)
MCH: 30.3 pg (ref 26.0–34.0)
MCHC: 34 g/dL (ref 30.0–36.0)
MCV: 89.2 fL (ref 78.0–100.0)
MONO ABS: 1.1 10*3/uL — AB (ref 0.1–1.0)
MONOS PCT: 8 % (ref 3–12)
Neutro Abs: 10.7 10*3/uL — ABNORMAL HIGH (ref 1.7–7.7)
Neutrophils Relative %: 81 % — ABNORMAL HIGH (ref 43–77)
Platelets: 199 10*3/uL (ref 150–400)
RBC: 4.16 MIL/uL (ref 3.87–5.11)
RDW: 14.8 % (ref 11.5–15.5)
WBC: 13.3 10*3/uL — ABNORMAL HIGH (ref 4.0–10.5)

## 2014-03-16 LAB — URIC ACID: URIC ACID, SERUM: 12.4 mg/dL — AB (ref 2.4–7.0)

## 2014-03-16 LAB — GLUCOSE, CAPILLARY: GLUCOSE-CAPILLARY: 147 mg/dL — AB (ref 70–99)

## 2014-03-16 LAB — TROPONIN I: Troponin I: <0.3 ng/mL (ref <0.30)

## 2014-03-16 LAB — SEDIMENTATION RATE: SED RATE: 43 mm/h — AB (ref 0–22)

## 2014-03-16 LAB — MAGNESIUM: Magnesium: 1.2 mg/dL — ABNORMAL LOW (ref 1.5–2.5)

## 2014-03-16 LAB — PRO B NATRIURETIC PEPTIDE: Pro B Natriuretic peptide (BNP): 6646 pg/mL — ABNORMAL HIGH (ref 0–450)

## 2014-03-16 MED ORDER — TEMAZEPAM 15 MG PO CAPS: 15.0000 mg | ORAL_CAPSULE | Freq: Every evening | ORAL | Status: DC | PRN

## 2014-03-16 MED ORDER — SIMVASTATIN 20 MG PO TABS
20.0000 mg | ORAL_TABLET | Freq: Every evening | ORAL | Status: DC
  Administered 2014-03-16 – 2014-03-17 (×2): 20 mg via ORAL
  Filled 2014-03-16 (×4): qty 1

## 2014-03-16 MED ORDER — COLCHICINE 0.6 MG PO TABS
0.3000 mg | ORAL_TABLET | Freq: Two times a day (BID) | ORAL | Status: DC
  Administered 2014-03-16 – 2014-03-18 (×4): 0.3 mg via ORAL
  Filled 2014-03-16 (×4): qty 1

## 2014-03-16 MED ORDER — METOPROLOL TARTRATE 25 MG PO TABS
25.0000 mg | ORAL_TABLET | Freq: Two times a day (BID) | ORAL | Status: DC
  Administered 2014-03-17 – 2014-03-18 (×3): 25 mg via ORAL
  Filled 2014-03-16 (×3): qty 1
  Filled 2014-03-16 (×2): qty 2

## 2014-03-16 MED ORDER — ASPIRIN 81 MG PO CHEW
324.0000 mg | CHEWABLE_TABLET | Freq: Once | ORAL | Status: AC
  Administered 2014-03-16: 324 mg via ORAL

## 2014-03-16 MED ORDER — CYCLOSPORINE 0.05 % OP EMUL
1.0000 [drp] | Freq: Two times a day (BID) | OPHTHALMIC | Status: DC
  Administered 2014-03-17 – 2014-03-18 (×3): 1 [drp] via OPHTHALMIC
  Filled 2014-03-16 (×8): qty 1

## 2014-03-16 MED ORDER — PANTOPRAZOLE SODIUM 40 MG PO TBEC
40.0000 mg | DELAYED_RELEASE_TABLET | Freq: Two times a day (BID) | ORAL | Status: DC
  Administered 2014-03-16 – 2014-03-18 (×4): 40 mg via ORAL
  Filled 2014-03-16 (×4): qty 1

## 2014-03-16 MED ORDER — SODIUM CHLORIDE 0.9 % IJ SOLN
3.0000 mL | Freq: Two times a day (BID) | INTRAMUSCULAR | Status: DC
  Administered 2014-03-16 – 2014-03-17 (×2): 3 mL via INTRAVENOUS

## 2014-03-16 MED ORDER — HEPARIN SODIUM (PORCINE) 5000 UNIT/ML IJ SOLN
5000.0000 [IU] | Freq: Three times a day (TID) | INTRAMUSCULAR | Status: DC
  Administered 2014-03-17 – 2014-03-18 (×3): 5000 [IU] via SUBCUTANEOUS
  Filled 2014-03-16 (×5): qty 1

## 2014-03-16 MED ORDER — GLIMEPIRIDE 2 MG PO TABS
2.0000 mg | ORAL_TABLET | Freq: Every day | ORAL | Status: DC
  Administered 2014-03-17: 2 mg via ORAL
  Filled 2014-03-16: qty 1

## 2014-03-16 MED ORDER — ONDANSETRON HCL 4 MG PO TABS: 4.0000 mg | ORAL_TABLET | Freq: Four times a day (QID) | ORAL | Status: DC | PRN

## 2014-03-16 MED ORDER — ASPIRIN EC 325 MG PO TBEC
325.0000 mg | DELAYED_RELEASE_TABLET | Freq: Every day | ORAL | Status: DC
  Administered 2014-03-17 – 2014-03-18 (×2): 325 mg via ORAL
  Filled 2014-03-16 (×2): qty 1

## 2014-03-16 MED ORDER — INSULIN ASPART 100 UNIT/ML ~~LOC~~ SOLN
0.0000 [IU] | SUBCUTANEOUS | Status: DC
Start: 2014-03-17 — End: 2014-03-17
  Administered 2014-03-17 (×2): 3 [IU] via SUBCUTANEOUS
  Administered 2014-03-17: 2 [IU] via SUBCUTANEOUS
  Administered 2014-03-17: 5 [IU] via SUBCUTANEOUS

## 2014-03-16 MED ORDER — POTASSIUM CHLORIDE CRYS ER 10 MEQ PO TBCR
10.0000 meq | EXTENDED_RELEASE_TABLET | Freq: Every day | ORAL | Status: DC
  Administered 2014-03-16 – 2014-03-18 (×3): 10 meq via ORAL
  Filled 2014-03-16 (×2): qty 1

## 2014-03-16 MED ORDER — CLOPIDOGREL BISULFATE 75 MG PO TABS
75.0000 mg | ORAL_TABLET | Freq: Every day | ORAL | Status: DC
  Administered 2014-03-17 – 2014-03-18 (×2): 75 mg via ORAL
  Filled 2014-03-16 (×2): qty 1

## 2014-03-16 MED ORDER — LEVOTHYROXINE SODIUM 100 MCG PO TABS
100.0000 ug | ORAL_TABLET | Freq: Every day | ORAL | Status: DC
  Administered 2014-03-17 – 2014-03-18 (×2): 100 ug via ORAL
  Filled 2014-03-16 (×2): qty 1

## 2014-03-16 MED ORDER — METHYLPREDNISOLONE SODIUM SUCC 40 MG IJ SOLR
40.0000 mg | Freq: Once | INTRAMUSCULAR | Status: AC
  Administered 2014-03-16: 40 mg via INTRAVENOUS
  Filled 2014-03-16: qty 1

## 2014-03-16 MED ORDER — ASPIRIN 81 MG PO CHEW
CHEWABLE_TABLET | ORAL | Status: AC
  Filled 2014-03-16: qty 4

## 2014-03-16 MED ORDER — DONEPEZIL HCL 5 MG PO TABS
5.0000 mg | ORAL_TABLET | Freq: Every day | ORAL | Status: DC
  Administered 2014-03-17 – 2014-03-18 (×2): 5 mg via ORAL
  Filled 2014-03-16 (×2): qty 1

## 2014-03-16 MED ORDER — POTASSIUM CHLORIDE 10 MEQ/100ML IV SOLN
10.0000 meq | INTRAVENOUS | Status: AC
  Administered 2014-03-16: 10 meq via INTRAVENOUS
  Filled 2014-03-16: qty 100

## 2014-03-16 MED ORDER — POTASSIUM CHLORIDE CRYS ER 20 MEQ PO TBCR
40.0000 meq | EXTENDED_RELEASE_TABLET | Freq: Once | ORAL | Status: AC
  Administered 2014-03-16: 40 meq via ORAL
  Filled 2014-03-16: qty 2

## 2014-03-16 MED ORDER — SODIUM CHLORIDE 0.9 % IV SOLN
INTRAVENOUS | Status: DC
  Administered 2014-03-16: 10 mL/h via INTRAVENOUS

## 2014-03-16 MED ORDER — HYDROMORPHONE HCL PF 1 MG/ML IJ SOLN
0.5000 mg | INTRAMUSCULAR | Status: DC | PRN
  Administered 2014-03-17: 0.5 mg via INTRAVENOUS
  Filled 2014-03-16: qty 1

## 2014-03-16 MED ORDER — ONDANSETRON HCL 4 MG/2ML IJ SOLN
4.0000 mg | Freq: Four times a day (QID) | INTRAMUSCULAR | Status: DC | PRN
  Administered 2014-03-17: 4 mg via INTRAVENOUS
  Filled 2014-03-16: qty 2

## 2014-03-16 NOTE — ED Notes (Signed)
Denies any chest pain at this time.

## 2014-03-16 NOTE — ED Provider Notes (Signed)
CSN: [id number]     Arrival date & time 03/16/14  1629 History  This chart was scribed for Leota Jacobsen, MD by Jeanell Sparrow, ED Scribe. This patient was seen in room APA05/APA05 and the patient's care was started at 4:48 PM.   Chief Complaint  Patient presents with  . Chest Pain   The history is provided by the patient. No language interpreter was used.   HPI Comments: [name] [name] is a 80 y.o. female with a hx of HTN who presents to the Emergency Department complaining of moderate intermittent substernal chest pain that started one week ago. She states that the pain radiates to to her left arm. She describes the pain as sharp. She reports that the episodes of chest pain last for about 5 minutes. She states that the pain is exacerbated by physical activity and relieved by resting. She reports that she took tylenol extra strength with no relief. She denies any SOB, diaphoresis, fever, cough, or dysuria.   Pt also complains of swelling in her left foot that started a week ago. She reports that a week ago, both of her feet were swelling. Denies any recent h/o trauma  Past Medical History  Diagnosis Date  . Diabetes mellitus   . Hypertension   . Clostridium difficile colitis 01/2013  . Hiatal hernia   . Renal insufficiency   . Hypothyroid   . Left renal artery stenosis   . GERD (gastroesophageal reflux disease)   . Coronary artery disease   . Diverticulitis   . Uterine fibroid   . Shingles    Past Surgical History  Procedure Laterality Date  . Renal artery stent Left    History reviewed. No pertinent family history. History  Substance Use Topics  . Smoking status: Never Smoker   . Smokeless tobacco: Not on file  . Alcohol Use: No   OB History   Grav Para Term Preterm Abortions TAB SAB Ect Mult Living                 Review of Systems  Constitutional: Negative for fever and diaphoresis.  Respiratory: Negative for cough and shortness of breath.   Cardiovascular: Positive  for chest pain.  Genitourinary: Negative for dysuria.  All other systems reviewed and are negative.   Allergies  Review of patient's allergies indicates no known allergies.  Home Medications   Prior to Admission medications   Medication Sig Start Date End Date Taking? Authorizing Provider  amLODipine (NORVASC) 5 MG tablet Take 5 mg by mouth daily. 05/21/13  Yes Historical Provider, MD  clopidogrel (PLAVIX) 75 MG tablet Take 75 mg by mouth daily.   Yes Historical Provider, MD  cycloSPORINE (RESTASIS) 0.05 % ophthalmic emulsion Place 1 drop into both eyes 2 (two) times daily.   Yes Historical Provider, MD  donepezil (ARICEPT) 5 MG tablet Take 5 mg by mouth daily. 05/17/13  Yes Historical Provider, MD  furosemide (LASIX) 40 MG tablet Take 40 mg by mouth 2 (two) times daily.   Yes Historical Provider, MD  glimepiride (AMARYL) 2 MG tablet Take 2 mg by mouth daily. 05/29/13  Yes Historical Provider, MD  lactulose (CHRONULAC) 10 GM/15ML solution Take 10 g by mouth 2 (two) times daily.   Yes Historical Provider, MD  levothyroxine (SYNTHROID, LEVOTHROID) 100 MCG tablet Take 100 mcg by mouth daily.   Yes Historical Provider, MD  metoprolol succinate (TOPROL-XL) 25 MG 24 hr tablet Take 25 mg by mouth 2 (two) times daily.   Yes  Historical Provider, MD  Multiple Vitamin (MULITIVITAMIN WITH MINERALS) TABS Take 1 tablet by mouth at bedtime.   Yes Historical Provider, MD  oxybutynin (DITROPAN-XL) 10 MG 24 hr tablet Take 10 mg by mouth daily.   Yes Historical Provider, MD  pantoprazole (PROTONIX) 40 MG tablet Take 40 mg by mouth 2 (two) times daily. 06/02/13  Yes Historical Provider, MD  simvastatin (ZOCOR) 20 MG tablet Take 20 mg by mouth every evening.   Yes Historical Provider, MD  temazepam (RESTORIL) 15 MG capsule Take 15 mg by mouth at bedtime as needed. Sleep   Yes Historical Provider, MD  valsartan (DIOVAN) 80 MG tablet Take 80 mg by mouth daily.   Yes Historical Provider, MD  diphenhydrAMINE (BENADRYL)  12.5 MG/5ML elixir Take 5 mLs (12.5 mg total) by mouth every 6 (six) hours as needed for itching. 09/13/13   Evalee Jefferson, PA-C   BP 158/98  Pulse 86  Temp(Src) 98.7 F (37.1 C) (Oral)  Resp 18  Wt 140 lb (63.504 kg)  SpO2 100% Physical Exam  Nursing note and vitals reviewed. Constitutional: She is oriented to person, place, and time. She appears well-developed and well-nourished.  Non-toxic appearance. No distress.  HENT:  Head: Normocephalic and atraumatic.  Eyes: Conjunctivae, EOM and lids are normal. Pupils are equal, round, and reactive to light.  Neck: Normal range of motion. Neck supple. No tracheal deviation present. No mass present.  Cardiovascular: Normal rate, regular rhythm and normal heart sounds.  Exam reveals no gallop.   No murmur heard. Pulmonary/Chest: Effort normal and breath sounds normal. No stridor. No respiratory distress. She has no decreased breath sounds. She has no wheezes. She has no rhonchi. She has no rales.  Abdominal: Soft. Normal appearance and bowel sounds are normal. She exhibits no distension. There is tenderness. There is no rebound and no CVA tenderness.  Mild epigastric and suprapubic tenderness. No rebounding or guarding.    Musculoskeletal: Normal range of motion. She exhibits edema. She exhibits no tenderness.  Some edema with no erythema.  Neurological: She is alert and oriented to person, place, and time. She has normal strength. No cranial nerve deficit or sensory deficit. GCS eye subscore is 4. GCS verbal subscore is 5. GCS motor subscore is 6.  Skin: Skin is warm and dry. No abrasion and no rash noted.  Psychiatric: She has a normal mood and affect. Her speech is normal and behavior is normal.    ED Course  Procedures (including critical care time) DIAGNOSTIC STUDIES: Oxygen Saturation is 100% on RA, normal by interpretation.    COORDINATION OF CARE: 4:52 PM- Pt advised of plan for treatment which includes medication, radiology, and labs  and pt agrees.  Labs Review Labs Reviewed  CBC WITH DIFFERENTIAL - Abnormal; Notable for the following:    WBC 13.3 (*)    Neutrophils Relative % 81 (*)    Neutro Abs 10.7 (*)    Lymphocytes Relative 11 (*)    Monocytes Absolute 1.1 (*)    All other components within normal limits  COMPREHENSIVE METABOLIC PANEL - Abnormal; Notable for the following:    Potassium 3.0 (*)    Glucose, Bld 134 (*)    BUN 25 (*)    Creatinine, Ser 1.74 (*)    Calcium 8.3 (*)    Albumin 3.3 (*)    GFR calc non Af Amer 26 (*)    GFR calc Af Amer 30 (*)    All other components within normal limits  PRO  B NATRIURETIC PEPTIDE - Abnormal; Notable for the following:    Pro B Natriuretic peptide (BNP) 6646.0 (*)    All other components within normal limits  TROPONIN I    Imaging Review Dg Chest 2 View  03/16/2014   CLINICAL DATA:  Chest pain.  EXAM: CHEST  2 VIEW  COMPARISON:  Chest x-ray 06/14/2013.  FINDINGS: Mediastinum and hilar structures unremarkable. Cardiomegaly. Pulmonary vascularity normal. Lungs are clear. No pleural effusion or pneumothorax. No acute bony abnormality.  IMPRESSION: Stable cardiomegaly.  No acute abnormality.   Electronically Signed   By: Marcello Moores  Register   On: 03/16/2014 18:05     EKG Interpretation   Date/Time:  Thursday March 16 2014 16:35:44 EDT Ventricular Rate:  84 PR Interval:  226 QRS Duration: 82 QT Interval:  438 QTC Calculation: 517 R Axis:   -21 Text Interpretation:  Sinus rhythm with 1st degree A-V block with  occasional Premature ventricular complexes Nonspecific T wave abnormality  Prolonged QT Abnormal ECG Confirmed by Zenia Resides  MD, [place] Jaquith ([id number]) on  03/16/2014 5:50:32 PM      MDM   Final diagnoses:  None    I personally performed the services described in this documentation, which was scribed in my presence. The recorded information has been reviewed and is accurate.    Patient given aspirin here. she was given potassium here for her mild  hypokalemia.. Will be admitted for evaluation of her chest pain.  Leota Jacobsen, MD 03/16/14 [place]

## 2014-03-16 NOTE — H&P (Signed)
[facility] History and Physical  [name] [name] PVX:[id number] DOB: [birth year]    PCP:   Leola Brazil, MD   Chief Complaint: left foot pain.  HPI: [name] [name] is an 80 y.o. female with hx of prior left foot pain one year ago, was told she had gout, hx of DM, diastolic dysfx, HTN, CKD with RAS s/p renal stent, presents to the ER as she has pain on her left foot for about 2 weeks.  She hurts with ambulation.  She had no trauma, and denied any leg pain.  On ROS, she also has had increase DOE and substernal chest tightness with exertion.  She denied coughs, fever, chills, orthopnea or PND.  Evalaution in the ER showed EKG with elevated QTc, but no acute ST T changes, BNP of 6600, and Cr of 1.7.  She has a WBC of 13K, and Hb of 12.6 g per dL, and negative troponin.  Her CXR was clear, no vascular congestion and no infiltrate.  Hospitalist was asked to admit her for DOE and atypical chest pain.  Rewiew of Systems:  Constitutional: Negative for malaise, fever and chills. No significant weight loss or weight gain Eyes: Negative for eye pain, redness and discharge, diplopia, visual changes, or flashes of light. ENMT: Negative for ear pain, hoarseness, nasal congestion, sinus pressure and sore throat. No headaches; tinnitus, drooling, or problem swallowing. Cardiovascular: Negative for palpitations, diaphoresis,; No orthopnea, PND Respiratory: Negative for cough, hemoptysis, wheezing and stridor. No pleuritic chestpain. Gastrointestinal: Negative for nausea, vomiting, diarrhea, constipation, abdominal pain, melena, blood in stool, hematemesis, jaundice and rectal bleeding.    Genitourinary: Negative for frequency, dysuria, incontinence,flank pain and hematuria; Musculoskeletal: Negative for back pain and neck pain. Negative for swelling and trauma.;  Skin: . Negative for pruritus, rash, abrasions, bruising and skin lesion.; ulcerations Neuro: Negative for headache, lightheadedness and  neck stiffness. Negative for weakness, altered level of consciousness , altered mental status, extremity weakness, burning feet, involuntary movement, seizure and syncope.  Psych: negative for anxiety, depression, insomnia, tearfulness, panic attacks, hallucinations, paranoia, suicidal or homicidal ideation    Past Medical History  Diagnosis Date  . Diabetes mellitus   . Hypertension   . Clostridium difficile colitis 01/2013  . Hiatal hernia   . Renal insufficiency   . Hypothyroid   . Left renal artery stenosis   . GERD (gastroesophageal reflux disease)   . Coronary artery disease   . Diverticulitis   . Uterine fibroid   . Shingles     Past Surgical History  Procedure Laterality Date  . Renal artery stent Left     Medications:  HOME MEDS: Prior to Admission medications   Medication Sig Start Date End Date Taking? Authorizing Provider  amLODipine (NORVASC) 5 MG tablet Take 5 mg by mouth daily. 05/21/13  Yes Historical Provider, MD  clopidogrel (PLAVIX) 75 MG tablet Take 75 mg by mouth daily.   Yes Historical Provider, MD  cycloSPORINE (RESTASIS) 0.05 % ophthalmic emulsion Place 1 drop into both eyes 2 (two) times daily.   Yes Historical Provider, MD  donepezil (ARICEPT) 5 MG tablet Take 5 mg by mouth daily. 05/17/13  Yes Historical Provider, MD  furosemide (LASIX) 40 MG tablet Take 40 mg by mouth 2 (two) times daily.   Yes Historical Provider, MD  glimepiride (AMARYL) 2 MG tablet Take 2 mg by mouth daily. 05/29/13  Yes Historical Provider, MD  lactulose (CHRONULAC) 10 GM/15ML solution Take 10 g by mouth 2 (two) times daily.  Yes Historical Provider, MD  levothyroxine (SYNTHROID, LEVOTHROID) 100 MCG tablet Take 100 mcg by mouth daily.   Yes Historical Provider, MD  metoprolol succinate (TOPROL-XL) 25 MG 24 hr tablet Take 25 mg by mouth 2 (two) times daily.   Yes Historical Provider, MD  Multiple Vitamin (MULITIVITAMIN WITH MINERALS) TABS Take 1 tablet by mouth at bedtime.   Yes  Historical Provider, MD  oxybutynin (DITROPAN-XL) 10 MG 24 hr tablet Take 10 mg by mouth daily.   Yes Historical Provider, MD  pantoprazole (PROTONIX) 40 MG tablet Take 40 mg by mouth 2 (two) times daily. 06/02/13  Yes Historical Provider, MD  simvastatin (ZOCOR) 20 MG tablet Take 20 mg by mouth every evening.   Yes Historical Provider, MD  temazepam (RESTORIL) 15 MG capsule Take 15 mg by mouth at bedtime as needed. Sleep   Yes Historical Provider, MD  valsartan (DIOVAN) 80 MG tablet Take 80 mg by mouth daily.   Yes Historical Provider, MD  diphenhydrAMINE (BENADRYL) 12.5 MG/5ML elixir Take 5 mLs (12.5 mg total) by mouth every 6 (six) hours as needed for itching. 09/13/13   Evalee Jefferson, PA-C     Allergies:  No Known Allergies  Social History:   reports that she has never smoked. She does not have any smokeless tobacco history on file. She reports that she does not drink alcohol or use illicit drugs.  Family History: History reviewed. No pertinent family history.   Physical Exam: Filed Vitals:   03/16/14 1730 03/16/14 1800 03/16/14 1900 03/16/14 1930  BP: 125/66 115/74 128/80 148/95  Pulse: 73 71 72 76  Temp:      TempSrc:      Resp: $Remo'18 11 24 18  'lVvIk$ Weight:      SpO2: 97% 97% 96% 99%   Blood pressure 148/95, pulse 76, temperature 98.7 F (37.1 C), temperature source Oral, resp. rate 18, weight 63.504 kg (140 lb), SpO2 99.00%.  GEN:  Pleasant  patient lying in the stretcher in no acute distress; cooperative with exam. PSYCH:  alert and oriented x4; does not appear anxious or depressed; affect is appropriate. HEENT: Mucous membranes pink and anicteric; PERRLA; EOM intact; no cervical lymphadenopathy nor thyromegaly or carotid bruit; no JVD; There were no stridor. Neck is very supple. Breasts:: Not examined CHEST WALL: No tenderness CHEST: Normal respiration, clear to auscultation bilaterally.  HEART: Regular rate and rhythm.  There are no murmur, rub, or gallops.   BACK: No kyphosis or  scoliosis; no CVA tenderness ABDOMEN: soft and non-tender; no masses, no organomegaly, normal abdominal bowel sounds; no pannus; no intertriginous candida. There is no rebound and no distention. Rectal Exam: Not done EXTREMITIES: No bone or joint deformity; age-appropriate arthropathy of the hands and knees; she has edema to the left foot and ankle, good pulses bilaterally with doppler.  no ulcerations.  There is no calf tenderness. Genitalia: not examined PULSES: 2+ and symmetric SKIN: Normal hydration no rash or ulceration CNS: Cranial nerves 2-12 grossly intact no focal lateralizing neurologic deficit.  Speech is fluent; uvula elevated with phonation, facial symmetry and tongue midline. DTR are normal bilaterally, cerebella exam is intact, barbinski is negative and strengths are equaled bilaterally.  No sensory loss.   Labs on Admission:  Basic Metabolic Panel:  Recent Labs Lab 03/16/14 1708  NA 140  K 3.0*  CL 99  CO2 26  GLUCOSE 134*  BUN 25*  CREATININE 1.74*  CALCIUM 8.3*   Liver Function Tests:  Recent Labs Lab 03/16/14 1708  AST 11  ALT <5  ALKPHOS 59  BILITOT 1.1  PROT 7.1  ALBUMIN 3.3*   No results found for this basename: LIPASE, AMYLASE,  in the last 168 hours No results found for this basename: AMMONIA,  in the last 168 hours CBC:  Recent Labs Lab 03/16/14 1708  WBC 13.3*  NEUTROABS 10.7*  HGB 12.6  HCT 37.1  MCV 89.2  PLT 199   Cardiac Enzymes:  Recent Labs Lab 03/16/14 1708  TROPONINI <0.30    CBG: No results found for this basename: GLUCAP,  in the last 168 hours   Radiological Exams on Admission: Dg Chest 2 View  03/16/2014   CLINICAL DATA:  Chest pain.  EXAM: CHEST  2 VIEW  COMPARISON:  Chest x-ray 06/14/2013.  FINDINGS: Mediastinum and hilar structures unremarkable. Cardiomegaly. Pulmonary vascularity normal. Lungs are clear. No pleural effusion or pneumothorax. No acute bony abnormality.  IMPRESSION: Stable cardiomegaly.  No acute  abnormality.   Electronically Signed   By: Marcello Moores  Register   On: 03/16/2014 18:05    EKG: Independently reviewed. QTc prolongation, NSR, 1 deg AVB,  No ischemic changes.   Assessment/Plan Present on Admission:  . DOE (dyspnea on exertion) . Left foot pain . Hypothyroidism . Atypical chest pain . DM (diabetes mellitus) . CKD (chronic kidney disease)  PLAN:  She is admitted for DOE, and exertional chest pressure, and will be r/out with cycling of troponins.  She will need an ECHO, and likely a stress test as well as soon as reasonable.  She has no clinical symptoms of CHF, so I am not sure how this elevated BNP is going to help [place] clinically. She has elevated CKD, so I hesitate to give her lasix.  Will also hold her Diovan temporarily due to her Cr elevation more than her baseline of 1.3-1.4.  The reason she came to the ER however, was because of her left foot.  It could be gout, but I am not certain.  Will obtain Xray of the left foot and ankle, and obtain uric acid, rheumatoid factor, and ESR.  I don't think this is an infection.  Will give her one course of IV steroid, and start very low dose colchicine.  If she is not better, we will need consult orthopedics.  For her QTc, will check mag and avoid meds causing prolongation of QTc.  She also has low K, and will be supplemented.  She is stable, full code, and will be admitted to [facility] service.  Thank you for allowing me to participate in the care of your nice patient.  Other plans as per orders.  Code Status: FULL Haskel Khan, MD. [facility] Pager [phone] 7pm to 7am.  03/16/2014, 8:18 PM

## 2014-03-16 NOTE — ED Notes (Signed)
Attempted iv times 2 without success.

## 2014-03-16 NOTE — ED Notes (Addendum)
Pt co chest pain and lt arm pain x 1 week, pt also co left foot swelling.

## 2014-03-16 NOTE — ED Notes (Signed)
Hospitalist at bedside 

## 2014-03-17 ENCOUNTER — Encounter (HOSPITAL_COMMUNITY): Payer: Self-pay | Admitting: Internal Medicine

## 2014-03-17 DIAGNOSIS — I059 Rheumatic mitral valve disease, unspecified: Secondary | ICD-10-CM

## 2014-03-17 DIAGNOSIS — I10 Essential (primary) hypertension: Secondary | ICD-10-CM | POA: Diagnosis present

## 2014-03-17 LAB — BASIC METABOLIC PANEL
Anion gap: 17 — ABNORMAL HIGH (ref 5–15)
BUN: 27 mg/dL — ABNORMAL HIGH (ref 6–23)
CHLORIDE: 98 meq/L (ref 96–112)
CO2: 22 mEq/L (ref 19–32)
Calcium: 8.4 mg/dL (ref 8.4–10.5)
Creatinine, Ser: 1.62 mg/dL — ABNORMAL HIGH (ref 0.50–1.10)
GFR calc Af Amer: 33 mL/min — ABNORMAL LOW (ref >90)
GFR calc non Af Amer: 29 mL/min — ABNORMAL LOW (ref >90)
GLUCOSE: 302 mg/dL — AB (ref 70–99)
POTASSIUM: 3.8 meq/L (ref 3.7–5.3)
Sodium: 137 mEq/L (ref 137–147)

## 2014-03-17 LAB — GLUCOSE, CAPILLARY
GLUCOSE-CAPILLARY: 198 mg/dL — AB (ref 70–99)
GLUCOSE-CAPILLARY: 129 mg/dL — AB (ref 70–99)
Glucose-Capillary: 191 mg/dL — ABNORMAL HIGH (ref 70–99)
Glucose-Capillary: 241 mg/dL — ABNORMAL HIGH (ref 70–99)
Glucose-Capillary: 115 mg/dL — ABNORMAL HIGH (ref 70–99)

## 2014-03-17 LAB — CBC
HEMATOCRIT: 39.4 % (ref 36.0–46.0)
HEMOGLOBIN: 13.4 g/dL (ref 12.0–15.0)
MCH: 30.4 pg (ref 26.0–34.0)
MCHC: 34 g/dL (ref 30.0–36.0)
MCV: 89.3 fL (ref 78.0–100.0)
Platelets: 227 10*3/uL (ref 150–400)
RBC: 4.41 MIL/uL (ref 3.87–5.11)
RDW: 14.8 % (ref 11.5–15.5)
WBC: 10.6 10*3/uL — ABNORMAL HIGH (ref 4.0–10.5)

## 2014-03-17 LAB — TSH: TSH: 6.57 u[IU]/mL — ABNORMAL HIGH (ref 0.350–4.500)

## 2014-03-17 LAB — TROPONIN I

## 2014-03-17 LAB — RHEUMATOID FACTOR

## 2014-03-17 MED ORDER — IRBESARTAN 150 MG PO TABS
150.0000 mg | ORAL_TABLET | Freq: Every day | ORAL | Status: DC
Start: 2014-03-17 — End: 2014-03-18
  Administered 2014-03-17 – 2014-03-18 (×2): 150 mg via ORAL
  Filled 2014-03-17 (×2): qty 1

## 2014-03-17 MED ORDER — INSULIN ASPART 100 UNIT/ML ~~LOC~~ SOLN
0.0000 [IU] | Freq: Three times a day (TID) | SUBCUTANEOUS | Status: DC
  Administered 2014-03-18: 3 [IU] via SUBCUTANEOUS

## 2014-03-17 MED ORDER — PREDNISONE 20 MG PO TABS
40.0000 mg | ORAL_TABLET | Freq: Every day | ORAL | Status: DC
Start: 2014-03-18 — End: 2014-03-18
  Administered 2014-03-18: 40 mg via ORAL
  Filled 2014-03-17: qty 2

## 2014-03-17 MED ORDER — AMLODIPINE BESYLATE 5 MG PO TABS
5.0000 mg | ORAL_TABLET | Freq: Every day | ORAL | Status: DC
  Administered 2014-03-17 – 2014-03-18 (×2): 5 mg via ORAL
  Filled 2014-03-17 (×2): qty 1

## 2014-03-17 MED ORDER — INSULIN ASPART 100 UNIT/ML ~~LOC~~ SOLN: 0.0000 [IU] | Freq: Every day | SUBCUTANEOUS | Status: DC

## 2014-03-17 NOTE — Progress Notes (Signed)
  Echocardiogram 2D Echocardiogram has been performed.  [place], [place] 03/17/2014, 12:25 PM

## 2014-03-17 NOTE — Care Management Note (Addendum)
    Page 1 of 1   03/17/2014     4:59:14 PM CARE MANAGEMENT NOTE 03/17/2014  Patient:  [name] [name], [name] [name]   Account Number:  [id number]  Date Initiated:  03/17/2014  Documentation initiated by:  Vladimir Creeks  Subjective/Objective Assessment:   Admitted with foot pain and DOE/Chest pain. She is from home, alone, but says her son takes care of her as needed. She plans to return home, but would like [place] to assist, and an aide to assist with a bath, short term since she is having     Action/Plan:   pain i her foot, and difficulty getting aroung right now. Has a walker and a cane. She does well when her foot is not bothering her. [facility] chosen and set up   Anticipated DC Date:  03/18/2014   Anticipated DC Plan:  [place]  CM consult      [facility] Choice  HOME HEALTH   Choice offered to / List presented to:  [name]        [place] arranged  HH-1 RN      [place].   Status of service:  Completed, signed off Medicare Important Message given?   (If response is "NO", the following Medicare IM given date fields will be blank) Date Medicare IM given:   Medicare IM given by:   Date Additional Medicare IM given:   Additional Medicare IM given by:    Discharge Disposition:  [place]  Per UR Regulation:  Reviewed for med. necessity/level of care/duration of stay  If discussed at [place] of Stay Meetings, dates discussed:    Comments:  03/17/14 [place] RN/CM

## 2014-03-17 NOTE — Progress Notes (Signed)
[facility] PROGRESS NOTE  [name] [name] GYK:[id number] DOB: Jan 19, 1931 DOA: 03/16/2014 PCP: Leola Brazil, MD  80 y/o ?, known ty 2 DM diagnosed 5/15/2, htn, hypothyroid, hld, prior SVT, prior h/o diverticulitis 06/14/03, L renal artery stenosis s/p stent, Normal cath 01/05/2010,  h/o admission 01/25/14 severe C. diff complicated by ileus/sepsis etc. Who initally presented to [facility] 03/16/14 with episodic severe L grt toe pain  She was found to have some  Toe pain consitent with Gout flar and also CP and DOE promping cardiac rule out   Assessment/Plan: . DOE (dyspnea on exertion): etiology unclear. Chest xray stable cardiomegaly. No acute abnormality. BNP 6646. Troponin negative x3. EKG with SR 1 AV block. Hx diastolic dysfunction. . O2 sat >90% on room air.  Ambulate  . Left foot pain: improved somewhat this am.  pain for last 2 years. Reports her PCP has told her she has gout but has never prescribed medicine. xrays unremarkable.  Uric acid 12.4, sed rate 43, rheumatoid factor pending.  Received solumedrol in ed-transitioend 03/17/14 to po prednisone 40 for 5 days PT consulted for eval/safety walking-she has ambulated to RR today  Will continue colchicine   . Hypothyroidism: TSH pending.  continuelevothyroxine 100 mcg . Atypical chest pain: patient reports long hx of "heartburn". Reports pain is same as usual and she got relief after given Protonix in ED. Troponin negative x3. EKG as above. Await echo. Doubt pain is cardiac related but suspect she would benefit from OP follow with cardiology. Continue PPI  . DM (diabetes mellitus): oral agents at home amaryl 2 mg hedl CBG range 147-198.  Follow A1c ] use SSI for optimal control.  Carb modified diet.   . Acute on CKD (chronic kidney disease): chart review indicates baseline creatinine range 1.6.  Home lasix on hold.  Restarted home dose Diovan 80Amlodipine 5 daily  Hypertension: see above discussion  Unclear why on plavix-?  Renal stent.  Will enquire further  Code Status: full Family Communication: none present Disposition Plan: home when ready-PT to eval    Consultants:  none  Procedures:  none  Antibiotics:  none  HPI/Subjective:  Awake alert. circuitious and agrees with cosed line of quesitoning.  NOt confused but not clear if she can give a proprer hisotry  Objective: Filed Vitals:   03/17/14 0659  BP: 117/63  Pulse: 79  Temp: 98.4 F (36.9 C)  Resp: 18    Intake/Output Summary (Last 24 hours) at 03/17/14 0926 Last data filed at 03/16/14 2300  Gross per 24 hour  Intake    240 ml  Output      0 ml  Net    240 ml   Filed Weights   03/16/14 1636 03/16/14 2200  Weight: 63.504 kg (140 lb) 64.1 kg (141 lb 5 oz)    Exam:   General:  Somewhat thin and frail appearing. Appears comfortable  Cardiovascular: RRR No MGR left foot with erythema/edema right LE without edema  Respiratory: normal effort BS clear to ausculation bilaterally no wheeze no crackles  Abdomen: soft +BS non tender to palpation   Musculoskeletal: left foot with swelling/erythema/warmth and pain particularly dorsal aspect. Other joints without swelling/erythema   Data Reviewed: Basic Metabolic Panel:  Recent Labs Lab 03/16/14 1708 03/16/14 2024  NA 140  --   K 3.0*  --   CL 99  --   CO2 26  --   GLUCOSE 134*  --   BUN 25*  --   CREATININE 1.74*  --  CALCIUM 8.3*  --   MG  --  1.2*   Liver Function Tests:  Recent Labs Lab 03/16/14 1708  AST 11  ALT <5  ALKPHOS 59  BILITOT 1.1  PROT 7.1  ALBUMIN 3.3*   No results found for this basename: LIPASE, AMYLASE,  in the last 168 hours No results found for this basename: AMMONIA,  in the last 168 hours CBC:  Recent Labs Lab 03/16/14 1708  WBC 13.3*  NEUTROABS 10.7*  HGB 12.6  HCT 37.1  MCV 89.2  PLT 199   Cardiac Enzymes:  Recent Labs Lab 03/16/14 1708 03/16/14 2305 03/17/14 0424  TROPONINI <0.30 <0.30 <0.30   BNP (last 3  results)  Recent Labs  03/16/14 1708  PROBNP 6646.0*   CBG:  Recent Labs Lab 03/16/14 2349 03/17/14 0632 03/17/14 0747  GLUCAP 147* 198* 191*    No results found for this or any previous visit (from the past 240 hour(s)).   Studies: Dg Chest 2 View  03/16/2014   CLINICAL DATA:  Chest pain.  EXAM: CHEST  2 VIEW  COMPARISON:  Chest x-ray 06/14/2013.  FINDINGS: Mediastinum and hilar structures unremarkable. Cardiomegaly. Pulmonary vascularity normal. Lungs are clear. No pleural effusion or pneumothorax. No acute bony abnormality.  IMPRESSION: Stable cardiomegaly.  No acute abnormality.   Electronically Signed   By: Marcello Moores  Register   On: 03/16/2014 18:05   Dg Ankle 2 Views Left  03/16/2014   CLINICAL DATA:  Left ankle pain  EXAM: LEFT ANKLE - 2 VIEW  COMPARISON:  None on file for comparison.  FINDINGS: The bones are mildly osteopenic. The ankle joint mortise is preserved. There is no acute fracture. No significant osteophyte formation is demonstrated. There is subtle lucency in the subcortical bone of the lateral aspect of the distal fibular metaphysis that is likely chronic. There is no soft tissue swelling or calcification.  IMPRESSION: There is mild osteopenia and degenerative change of the left ankle but no acute bony abnormality.   Electronically Signed   By: David  Martinique   On: 03/16/2014 20:44   Dg Foot 2 Views Left  03/16/2014   CLINICAL DATA:  Painful left ankle and foot. Patient reports history of gout.  EXAM: LEFT FOOT - 2 VIEW  COMPARISON:  None.  FINDINGS: There is a subtle area of increased attenuation in the soft tissues along the medial margin of the first metatarsal head. There may be minimal underlying erosion although this is equivocal. The first metatarsophalangeal joint is well maintained.  Remaining joints are normally space and aligned. No other soft tissue abnormality.  No fracture.  Small dorsal calcaneal spur.  IMPRESSION: Possible gout, with subtle increased  attenuation in the soft tissue adjacent to the medial first metatarsal head which may reflect a tophus. Equivocal evidence of a subtle underlying bone erosion.  Small dorsal calcaneal spur.  No other abnormality.   Electronically Signed   By: Lajean Manes M.D.   On: 03/16/2014 20:45    Scheduled Meds: . aspirin EC  325 mg Oral Daily  . clopidogrel  75 mg Oral QAC breakfast  . colchicine  0.3 mg Oral BID  . cycloSPORINE  1 drop Both Eyes BID  . donepezil  5 mg Oral Daily  . glimepiride  2 mg Oral Q breakfast  . heparin  5,000 Units Subcutaneous 3 times per day  . insulin aspart  0-15 Units Subcutaneous 6 times per day  . levothyroxine  100 mcg Oral QAC breakfast  .  metoprolol tartrate  25 mg Oral BID  . pantoprazole  40 mg Oral BID  . potassium chloride  10 mEq Oral Daily  . simvastatin  20 mg Oral QPM  . sodium chloride  3 mL Intravenous Q12H   Continuous Infusions: . sodium chloride 10 mL/hr (03/16/14 1757)    Principal Problem:   DOE (dyspnea on exertion) Active Problems:   Hypothyroidism   Left foot pain   Atypical chest pain   DM (diabetes mellitus)   CKD (chronic kidney disease)    Time spent: 35 minutes.     [facility] Pager [phone]. If 7PM-7AM, please contact night-coverage at [email or web address], password [facility] 03/17/2014, 9:26 AM  LOS: 1 day     Verneita Griffes, MD [facility] [phone]

## 2014-03-17 NOTE — Care Management Utilization Note (Signed)
UR completed 

## 2014-03-17 NOTE — Evaluation (Addendum)
Physical Therapy Evaluation Patient Details Name: [name] [name] MRN: [id number] DOB: 09/05/1931 Today's Date: 03/17/2014   History of Present Illness  [name] [name] is an 80 y.o. female with hx of prior left foot pain one year ago, was told she had gout, hx of DM, diastolic dysfx, HTN, CKD with RAS s/p renal stent, presents to the ER as she has pain on her left foot for about 2 weeks.  She hurts with ambulation.  She had no trauma, and denied any leg pain.  On ROS, she also has had increase DOE and substernal chest tightness with exertion.  She denied coughs, fever, chills, orthopnea or PND.  Evalaution in the ER showed EKG with elevated QTc, but no acute ST T changes, BNP of 6600, and Cr of 1.7.  She has a WBC of 13K, and Hb of 12.6 g per dL, and negative troponin.  Her CXR was clear, no vascular congestion and no infiltrate.  Hospitalist was asked to admit her for DOE and atypical chest pain.  Clinical Impression  Pt is an 80 year old female who presents to physical therapy with dx of Lt ankle pain and chest pain.  Pt lives alone in a handicap apartment with a level entryway.  Pt reports her son lives nearby, and is able to assist PRN;  He completes her shopping, cooking, and driving activities.  Pt reports she was mod (I) with bed mobility skills, transfers, and household amb with use of RW or cane as needed.  Prior to PT evaluation, pt reports "hurts a little", though by end of evaluation and gait she reported increased pain to hurts even more.  Pt able to complete bed mobility skills mod (I), transfers min guard/supervision, and gait for 15 feet x2 with use of RW and min guard.  Noted abnormal gait, with limited knee flexion to clear feet.  Pt able to march in place without knee pain, and attempted VC to bend knees to clear feet though pt unable to follow commands.  Unknown if this is pt normal gait pattern (as limited knee flexion is on Rt and Lt LE), or pattern has abnormal gait due to Lt foot pain.   Recommend continued PT while in hospital to address ROM, strengthening, activity tolerance, and pain management for improvement of functional mobility skills.  No DME recommendations at this time.  Pt did question about home health nurse or aide for assistance around home; case manager informed.       Follow Up Recommendations Home health PT    Equipment Recommendations  None recommended by PT       Precautions / Restrictions Precautions Precautions: Fall Restrictions Weight Bearing Restrictions: No      Mobility  Bed Mobility Overal bed mobility: Modified Independent                Transfers Overall transfer level: Needs assistance Equipment used: Rolling walker (2 wheeled) Transfers: Sit to/from [facility] Sit to Stand: Supervision Stand pivot transfers: Min guard          Ambulation/Gait Ambulation/Gait assistance: Min guard Ambulation Distance (Feet): 15 Feet Assistive device: Rolling walker (2 wheeled) Gait Pattern/deviations: Step-to pattern;Decreased dorsiflexion - right;Decreased dorsiflexion - left     General Gait Details: 15 feet x2.  Noted limited knee flexion (B) LE during gait; VC to attempt knee flexion however pt unable to follow commands.  Knee flexion assessed in standing and pt able to perform marching without pain.  Unknown if pt has a  history of limited knee flexion with gait, or if this is new secondary to Lt foot pain.        Balance Overall balance assessment: Needs assistance Sitting-balance support: Feet supported;No upper extremity supported Sitting balance-Leahy Scale: Good     Standing balance support: Bilateral upper extremity supported;During functional activity (On RW with min guard. ) Standing balance-Leahy Scale: Fair                               Pertinent Vitals/Pain FACES hurts a little (2/10) when in bed, hurts even more (6/10) after gait evaluation    Home Living Family/patient expects  to be discharged to:: Private residence Living Arrangements: Alone Available Help at Discharge: Family;Available PRN/intermittently (Son (POA) lives nearby and assists PRN) Type of Home: Apartment (Handicap apartment) Home Access: Level entry     Home Layout: One level Home Equipment: Theresa - 2 wheels;Toilet riser;Grab bars - toilet;Grab bars - tub/shower Additional Comments: Tub shower    Prior Function Level of Independence: Independent with assistive device(s);Needs assistance   Gait / Transfers Assistance Needed: Pt lives alone and performs functional mobilty skills mod (I) with use of RW or cane as needed.   ADL's / Homemaking Assistance Needed: Pt reports her son does her shopping, cooking, driving.         Hand Dominance   Dominant Hand: Right    Extremity/Trunk Assessment               Lower Extremity Assessment: Generalized weakness;LLE deficits/detail   LLE Deficits / Details: Pain to medial/lateral aspects of Lt ankle with palpation.  No pain to plantar fascia or achillles tendon region with palpation.  Pt able to complete ROM through 75% into ankle dorsiflexion, plantarflexion, inversion, and eversion.      Communication   Communication: No difficulties  Cognition Arousal/Alertness: Awake/alert Behavior During Therapy: WFL for tasks assessed/performed Overall Cognitive Status: No family/caregiver present to determine baseline cognitive functioning (Pt able to report name, DOB, city, year, though unable to report month. )                               Assessment/Plan    PT Assessment Patient needs continued PT services  PT Diagnosis Abnormality of gait;Generalized weakness   PT Problem List Decreased strength;Decreased range of motion;Decreased activity tolerance;Decreased safety awareness;Pain;Decreased balance;Decreased mobility  PT Treatment Interventions Balance training;DME instruction;Gait training;Neuromuscular  re-education;Functional mobility training;Therapeutic activities;Therapeutic exercise   PT Goals (Current goals can be found in the Care Plan section) Acute Rehab PT Goals Patient Stated Goal: decrease pain PT Goal Formulation: With patient Time For Goal Achievement: 03/31/14 Potential to Achieve Goals: Good    Frequency Min 3X/week    End of Session Equipment Utilized During Treatment: Gait belt Activity Tolerance: Patient limited by pain Patient left: in chair;with call bell/phone within reach;with bed alarm set           Time: 5027-7412 PT Time Calculation (min): 21 min   Charges:   PT Evaluation $Initial PT Evaluation Tier I: 1 Procedure        G Codes : Mobility Initial - CJ, Goal - CH  Hoy Fallert 03/17/2014, 10:50 AM  ADDENDUM : G-codes

## 2014-03-18 LAB — COMPREHENSIVE METABOLIC PANEL
ALBUMIN: 3 g/dL — AB (ref 3.5–5.2)
ALT: 5 U/L (ref 0–35)
ANION GAP: 14 (ref 5–15)
AST: 11 U/L (ref 0–37)
Alkaline Phosphatase: 57 U/L (ref 39–117)
BUN: 28 mg/dL — AB (ref 6–23)
CO2: 24 mEq/L (ref 19–32)
Calcium: 8.1 mg/dL — ABNORMAL LOW (ref 8.4–10.5)
Chloride: 99 mEq/L (ref 96–112)
Creatinine, Ser: 1.42 mg/dL — ABNORMAL HIGH (ref 0.50–1.10)
GFR calc Af Amer: 39 mL/min — ABNORMAL LOW (ref >90)
GFR calc non Af Amer: 34 mL/min — ABNORMAL LOW (ref >90)
Glucose, Bld: 141 mg/dL — ABNORMAL HIGH (ref 70–99)
POTASSIUM: 3.7 meq/L (ref 3.7–5.3)
Sodium: 137 mEq/L (ref 137–147)
TOTAL PROTEIN: 6.7 g/dL (ref 6.0–8.3)
Total Bilirubin: 0.5 mg/dL (ref 0.3–1.2)

## 2014-03-18 LAB — GLUCOSE, CAPILLARY: Glucose-Capillary: 164 mg/dL — ABNORMAL HIGH (ref 70–99)

## 2014-03-18 MED ORDER — METOPROLOL TARTRATE 25 MG PO TABS: 25.0000 mg | ORAL_TABLET | Freq: Two times a day (BID) | ORAL | Status: DC

## 2014-03-18 MED ORDER — PREDNISONE 20 MG PO TABS
40.0000 mg | ORAL_TABLET | Freq: Every day | ORAL | Status: DC
Start: 2014-03-18 — End: 2014-04-08

## 2014-03-18 MED ORDER — COLCHICINE 0.6 MG PO TABS
0.3000 mg | ORAL_TABLET | Freq: Two times a day (BID) | ORAL | Status: DC
Start: 2014-03-18 — End: 2014-12-03

## 2014-03-18 NOTE — Progress Notes (Signed)
D.c instructions reviewed with patient and family.  Family stated that MD had called him at home this am and discussed instructions with him as well.  Verbalized understanding.  Pt dc'd to home with family. Schonewitz, Eulis Canner 03/18/2014

## 2014-03-18 NOTE — Progress Notes (Signed)
Faxed order, demographics, and d/c summary to [facility] for RN and aide at d.c.  Spoke with admissions to inform them of d/c today.  Schonewitz, Eulis Canner 03/18/2014

## 2014-03-18 NOTE — Discharge Summary (Signed)
Physician Discharge Summary  [name] [name] MGN:[id number] DOB: 1930-10-30 DOA: 03/16/2014  PCP: [name] Brazil, MD  Admit date: 03/16/2014 Discharge date: 03/18/2014  Time spent: 35 minutes  Recommendations for Outpatient Follow-up:  1. Needs to continue prednisone for 5 days at 40 mg dose 2. Colchicine prescribed this admission for gout 3. Will arrange followup with Dr. Bronson Ing for consideration of outpatient DEXA scan vs. exercise stress test as she has some symptoms concerning for inducible angina although she ruled out during hospital stay 4. Physical therapy will be coming to see her at Home health  5. Recommend repeating TSH in 3-4 weeks and adjust once clinically stable   Discharge Diagnoses:  Principal Problem:   DOE (dyspnea on exertion) Active Problems:   Hypothyroidism   Left foot pain   Atypical chest pain   DM (diabetes mellitus)   CKD (chronic kidney disease)   Hypertension   Discharge Condition: Fair  Diet recommendation: Regular diabetic diet  Filed Weights   03/16/14 1636 03/16/14 2200  Weight: 63.504 kg (140 lb) 64.1 kg (141 lb 5 oz)    History of present illness:  80 y/o ?, known ty 2 DM diagnosed 5/15/2, htn, hypothyroid, hld, prior SVT, prior h/o diverticulitis 06/14/03, L renal artery stenosis s/p stent, Normal cath 01/05/2010, h/o admission 01/25/14  severe C. diff complicated by ileus/sepsis etc.  Who initally presented to [facility] 03/16/14 with episodic severe L grt toe pain  She was found to have some Toe pain consitent with Gout flar  She also c/o ill defined CP and DOE promping cardiac rule out   Hospital Course:  DOE (dyspnea on exertion):  etiology 2/2 to CHF or stable angina.  Chest xray stable cardiomegaly.  No acute abnormality  BNP 6646. Troponin negative x3. EKG with SR 1 AV block. Hx diastolic dysfunction. .  O2 sat >90% on room air.  Ambulated without issue-main issue per therapy note was increasing pain   . Left foot pain:  improved somewhat this am. pain for last 2 years. Reports her PCP has told her she has gout but has never prescribed medicine. xrays unremarkable.  Uric acid 12.4, sed rate 43, rheumatoid factor pending.  Received solumedrol in ed-transitioend 03/17/14 to po prednisone 40 for 5 days  PT consulted for eval/safety walking-recommended home health Will continue colchicine   . Hypothyroidism: TSH 6.75. Continue levothyroxine 100 mcg Consider increasing dose of thyroxine on rpt labs 1 month   . Atypical chest pain:  patient reports long hx of "heartburn".  Reports pain is same as usual and she got relief after given Protonix in ED. Troponin negative x3.  EKG as above.   Echocardiogram = grade 2 diastolic dysfunction, wall thickness increased with severe LVH, moderately elevated on the pressures, mitral tricuspid regurgitation On discharge I will arrange for relatively close followup with cardiology to discuss these findings She may benefit from either a stress test , cardiac catheterization or further optimization of medications for pulmonary hypertension/hypertensive heart disease  . DM (diabetes mellitus): oral agents at home amaryl 2 mg held but reinstitution on discharge CBG range from 141-164  use SSI for optimal control.  Carb modified diet.   . Acute on CKD (chronic kidney disease): chart review indicates baseline creatinine range 1.6.  Home lasix on hold.  Restarted home dose Diovan 80Amlodipine 5 daily  Hypertension: see above discussion  Unclear why on plavix-? Renal stent. Will enquire further   Procedures:  Echocardiogram as above   Consultations:  None  Discharge Exam: Filed Vitals:   03/17/14 2205  BP: 134/77  Pulse: 73  Temp: 97.7 F (36.5 C)  Resp: 15    General: Alert pleasant oriented no apparent distress Cardiovascular: S1-S2 no murmur rub or gallop Respiratory: Clinically clear No lower extremity, no JVD  Discharge Instructions You were cared for by  a hospitalist during your hospital stay. If you have any questions about your discharge medications or the care you received while you were in the hospital after you are discharged, you can call the unit and asked to speak with the hospitalist on call if the hospitalist that took care of you is not available. Once you are discharged, your primary care physician will handle any further medical issues. Please note that NO REFILLS for any discharge medications will be authorized once you are discharged, as it is imperative that you return to your primary care physician (or establish a relationship with a primary care physician if you do not have one) for your aftercare needs so that they can reassess your need for medications and monitor your lab values.  Discharge Instructions   Diet - low sodium heart healthy    Complete by:  As directed      Discharge instructions    Complete by:  As directed   Patient to take 40 mg of prednisone daily for the next 4 days to help with her gouty flare I have prescribed her colchicine as well for this which she should take twice a day She will probably need to see a cardiologist for her nonspecific chest pain and I will give the name of the cardiologist followup with me. She will need some labs done in about 3-7 days at her regular doctors office     Increase activity slowly    Complete by:  As directed             Medication List    STOP taking these medications       metoprolol succinate 25 MG 24 hr tablet  Commonly known as:  TOPROL-XL  Replaced by:  metoprolol tartrate 25 MG tablet      TAKE these medications       amLODipine 5 MG tablet  Commonly known as:  NORVASC  Take 5 mg by mouth daily.     clopidogrel 75 MG tablet  Commonly known as:  PLAVIX  Take 75 mg by mouth daily.     colchicine 0.6 MG tablet  Take 0.5 tablets (0.3 mg total) by mouth 2 (two) times daily.     cycloSPORINE 0.05 % ophthalmic emulsion  Commonly known as:  RESTASIS   Place 1 drop into both eyes 2 (two) times daily.     diphenhydrAMINE 12.5 MG/5ML elixir  Commonly known as:  BENADRYL  Take 5 mLs (12.5 mg total) by mouth every 6 (six) hours as needed for itching.     donepezil 5 MG tablet  Commonly known as:  ARICEPT  Take 5 mg by mouth daily.     furosemide 40 MG tablet  Commonly known as:  LASIX  Take 40 mg by mouth 2 (two) times daily.     glimepiride 2 MG tablet  Commonly known as:  AMARYL  Take 2 mg by mouth daily.     lactulose 10 GM/15ML solution  Commonly known as:  CHRONULAC  Take 10 g by mouth 2 (two) times daily.     levothyroxine 100 MCG tablet  Commonly known as:  SYNTHROID, LEVOTHROID  Take  100 mcg by mouth daily.     metoprolol tartrate 25 MG tablet  Commonly known as:  LOPRESSOR  Take 1 tablet (25 mg total) by mouth 2 (two) times daily.     multivitamin with minerals Tabs tablet  Take 1 tablet by mouth at bedtime.     oxybutynin 10 MG 24 hr tablet  Commonly known as:  DITROPAN-XL  Take 10 mg by mouth daily.     pantoprazole 40 MG tablet  Commonly known as:  PROTONIX  Take 40 mg by mouth 2 (two) times daily.     predniSONE 20 MG tablet  Commonly known as:  DELTASONE  Take 2 tablets (40 mg total) by mouth daily before breakfast.     simvastatin 20 MG tablet  Commonly known as:  ZOCOR  Take 20 mg by mouth every evening.     temazepam 15 MG capsule  Commonly known as:  RESTORIL  Take 15 mg by mouth at bedtime as needed. Sleep     valsartan 80 MG tablet  Commonly known as:  DIOVAN  Take 80 mg by mouth daily.       No Known Allergies    The results of significant diagnostics from this hospitalization (including imaging, microbiology, ancillary and laboratory) are listed below for reference.    Significant Diagnostic Studies: Dg Chest 2 View  03/16/2014   CLINICAL DATA:  Chest pain.  EXAM: CHEST  2 VIEW  COMPARISON:  Chest x-ray 06/14/2013.  FINDINGS: Mediastinum and hilar structures unremarkable.  Cardiomegaly. Pulmonary vascularity normal. Lungs are clear. No pleural effusion or pneumothorax. No acute bony abnormality.  IMPRESSION: Stable cardiomegaly.  No acute abnormality.   Electronically Signed   By: Marcello Moores  Register   On: 03/16/2014 18:05   Dg Ankle 2 Views Left  03/16/2014   CLINICAL DATA:  Left ankle pain  EXAM: LEFT ANKLE - 2 VIEW  COMPARISON:  None on file for comparison.  FINDINGS: The bones are mildly osteopenic. The ankle joint mortise is preserved. There is no acute fracture. No significant osteophyte formation is demonstrated. There is subtle lucency in the subcortical bone of the lateral aspect of the distal fibular metaphysis that is likely chronic. There is no soft tissue swelling or calcification.  IMPRESSION: There is mild osteopenia and degenerative change of the left ankle but no acute bony abnormality.   Electronically Signed   By: David  Martinique   On: 03/16/2014 20:44   Dg Foot 2 Views Left  03/16/2014   CLINICAL DATA:  Painful left ankle and foot. Patient reports history of gout.  EXAM: LEFT FOOT - 2 VIEW  COMPARISON:  None.  FINDINGS: There is a subtle area of increased attenuation in the soft tissues along the medial margin of the first metatarsal head. There may be minimal underlying erosion although this is equivocal. The first metatarsophalangeal joint is well maintained.  Remaining joints are normally space and aligned. No other soft tissue abnormality.  No fracture.  Small dorsal calcaneal spur.  IMPRESSION: Possible gout, with subtle increased attenuation in the soft tissue adjacent to the medial first metatarsal head which may reflect a tophus. Equivocal evidence of a subtle underlying bone erosion.  Small dorsal calcaneal spur.  No other abnormality.   Electronically Signed   By: Lajean Manes M.D.   On: 03/16/2014 20:45    Microbiology: No results found for this or any previous visit (from the past 240 hour(s)).   Labs: Basic Metabolic Panel:  Recent Labs Lab  03/16/14 1708 03/16/14 2024 03/17/14 1037 03/18/14 0632  NA 140  --  137 137  K 3.0*  --  3.8 3.7  CL 99  --  98 99  CO2 26  --  22 24  GLUCOSE 134*  --  302* 141*  BUN 25*  --  27* 28*  CREATININE 1.74*  --  1.62* 1.42*  CALCIUM 8.3*  --  8.4 8.1*  MG  --  1.2*  --   --    Liver Function Tests:  Recent Labs Lab 03/16/14 1708 03/18/14 0632  AST 11 11  ALT <5 5  ALKPHOS 59 57  BILITOT 1.1 0.5  PROT 7.1 6.7  ALBUMIN 3.3* 3.0*   No results found for this basename: LIPASE, AMYLASE,  in the last 168 hours No results found for this basename: AMMONIA,  in the last 168 hours CBC:  Recent Labs Lab 03/16/14 1708 03/17/14 1037  WBC 13.3* 10.6*  NEUTROABS 10.7*  --   HGB 12.6 13.4  HCT 37.1 39.4  MCV 89.2 89.3  PLT 199 227   Cardiac Enzymes:  Recent Labs Lab 03/16/14 1708 03/16/14 2305 03/17/14 0424 03/17/14 1037  TROPONINI <0.30 <0.30 <0.30 <0.30   BNP: BNP (last 3 results)  Recent Labs  03/16/14 1708  PROBNP 6646.0*   CBG:  Recent Labs Lab 03/17/14 0747 03/17/14 1147 03/17/14 1607 03/17/14 2140 03/18/14 0710  GLUCAP 191* 241* 129* 115* 164*       Signed:  Nita Sells  [facility] 03/18/2014, 9:14 AM

## 2014-04-08 ENCOUNTER — Encounter (HOSPITAL_COMMUNITY): Payer: Self-pay | Admitting: Emergency Medicine

## 2014-04-08 ENCOUNTER — Emergency Department (HOSPITAL_COMMUNITY)
Admission: EM | Admit: 2014-04-08 | Discharge: 2014-04-09 | Disposition: A | Discharge status: Home / Self Care | Payer: Medicare Other | Attending: Emergency Medicine | Admitting: Emergency Medicine

## 2014-04-08 ENCOUNTER — Emergency Department (HOSPITAL_COMMUNITY): Payer: Medicare Other

## 2014-04-08 DIAGNOSIS — I2510 Atherosclerotic heart disease of native coronary artery without angina pectoris: Secondary | ICD-10-CM | POA: Insufficient documentation

## 2014-04-08 DIAGNOSIS — Z79899 Other long term (current) drug therapy: Secondary | ICD-10-CM | POA: Insufficient documentation

## 2014-04-08 DIAGNOSIS — Z8619 Personal history of other infectious and parasitic diseases: Secondary | ICD-10-CM | POA: Insufficient documentation

## 2014-04-08 DIAGNOSIS — I10 Essential (primary) hypertension: Secondary | ICD-10-CM | POA: Insufficient documentation

## 2014-04-08 DIAGNOSIS — E039 Hypothyroidism, unspecified: Secondary | ICD-10-CM | POA: Diagnosis not present

## 2014-04-08 DIAGNOSIS — K219 Gastro-esophageal reflux disease without esophagitis: Secondary | ICD-10-CM | POA: Insufficient documentation

## 2014-04-08 DIAGNOSIS — E119 Type 2 diabetes mellitus without complications: Secondary | ICD-10-CM | POA: Insufficient documentation

## 2014-04-08 DIAGNOSIS — K5900 Constipation, unspecified: Secondary | ICD-10-CM | POA: Diagnosis not present

## 2014-04-08 DIAGNOSIS — Z8742 Personal history of other diseases of the female genital tract: Secondary | ICD-10-CM | POA: Insufficient documentation

## 2014-04-08 LAB — BASIC METABOLIC PANEL
Anion gap: 16 — ABNORMAL HIGH (ref 5–15)
BUN: 24 mg/dL — AB (ref 6–23)
CALCIUM: 9.6 mg/dL (ref 8.4–10.5)
CO2: 26 mEq/L (ref 19–32)
Chloride: 99 mEq/L (ref 96–112)
Creatinine, Ser: 1.33 mg/dL — ABNORMAL HIGH (ref 0.50–1.10)
GFR calc Af Amer: 42 mL/min — ABNORMAL LOW (ref >90)
GFR, EST NON AFRICAN AMERICAN: 36 mL/min — AB (ref >90)
Glucose, Bld: 264 mg/dL — ABNORMAL HIGH (ref 70–99)
Potassium: 3.2 mEq/L — ABNORMAL LOW (ref 3.7–5.3)
Sodium: 141 mEq/L (ref 137–147)

## 2014-04-08 MED ORDER — POTASSIUM CHLORIDE CRYS ER 20 MEQ PO TBCR
20.0000 meq | EXTENDED_RELEASE_TABLET | Freq: Once | ORAL | Status: AC
  Administered 2014-04-09: 20 meq via ORAL
  Filled 2014-04-08: qty 1

## 2014-04-08 MED ORDER — BISACODYL 10 MG RE SUPP
10.0000 mg | Freq: Once | RECTAL | Status: AC
  Administered 2014-04-08: 10 mg via RECTAL
  Filled 2014-04-08: qty 1

## 2014-04-08 MED ORDER — FLEET ENEMA 7-19 GM/118ML RE ENEM
1.0000 | ENEMA | Freq: Once | RECTAL | Status: AC
  Administered 2014-04-08: 1 via RECTAL

## 2014-04-08 NOTE — ED Notes (Addendum)
Pt reporting no BM since Thursday.  No relief from prescribed lactulose.  Pt reporting pressure and bloating feeling, states she is passing gas.

## 2014-04-08 NOTE — ED Provider Notes (Signed)
CSN: [id number]     Arrival date & time 04/08/14  1926 History   First MD Initiated Contact with Patient 04/08/14 2141     Chief Complaint  Patient presents with  . Constipation     (Consider location/radiation/quality/duration/timing/severity/associated sxs/prior Treatment) HPI Comments:    The history is provided by the patient.   [name] [name] is a 80 y.o. female presenting with constipation.  She describes having rectal pain with attempts have a bowel movement which started today.  She describes what feels like a hard stool that she can almost evacuate but it will not move.  She normally has regular bowel movements every 1-2 days,  Her last occurred 2 days ago and was not a constipated stool.  She takes miralax daily, but states she ate a lot of peanut butter which may have caused constipation.  She denies fevers, chills, nausea, vomiting and denies abdominal pain although feels bloated.    Past Medical History  Diagnosis Date  . Diabetes mellitus   . Hypertension   . Clostridium difficile colitis 01/2013  . Hiatal hernia   . Renal insufficiency   . Hypothyroid   . Left renal artery stenosis   . GERD (gastroesophageal reflux disease)   . Coronary artery disease   . Diverticulitis   . Uterine fibroid   . Shingles   . Diastolic dysfunction    Past Surgical History  Procedure Laterality Date  . Renal artery stent Left    History reviewed. No pertinent family history. History  Substance Use Topics  . Smoking status: Never Smoker   . Smokeless tobacco: Not on file  . Alcohol Use: No   OB History   Grav Para Term Preterm Abortions TAB SAB Ect Mult Living                 Review of Systems  Constitutional: Negative for fever.  HENT: Negative for congestion and sore throat.   Eyes: Negative.   Respiratory: Negative for chest tightness and shortness of breath.   Cardiovascular: Negative for chest pain.  Gastrointestinal: Positive for constipation. Negative for nausea  and abdominal pain.  Genitourinary: Negative.   Musculoskeletal: Negative for arthralgias, joint swelling and neck pain.  Skin: Negative.  Negative for rash and wound.  Neurological: Negative for dizziness, weakness, light-headedness, numbness and headaches.  Psychiatric/Behavioral: Negative.       Allergies  Review of patient's allergies indicates no known allergies.  Home Medications   Prior to Admission medications   Medication Sig Start Date End Date Taking? Authorizing Provider  amLODipine (NORVASC) 5 MG tablet Take 5 mg by mouth daily. 05/21/13  Yes Historical Provider, MD  clopidogrel (PLAVIX) 75 MG tablet Take 75 mg by mouth daily.   Yes Historical Provider, MD  colchicine 0.6 MG tablet Take 0.5 tablets (0.3 mg total) by mouth 2 (two) times daily. 03/18/14  Yes Nita Sells, MD  cycloSPORINE (RESTASIS) 0.05 % ophthalmic emulsion Place 1 drop into both eyes 2 (two) times daily.   Yes Historical Provider, MD  donepezil (ARICEPT) 5 MG tablet Take 5 mg by mouth daily. 05/17/13  Yes Historical Provider, MD  furosemide (LASIX) 40 MG tablet Take 40 mg by mouth 2 (two) times daily.   Yes Historical Provider, MD  glimepiride (AMARYL) 2 MG tablet Take 2 mg by mouth daily. 05/29/13  Yes Historical Provider, MD  lactulose (CHRONULAC) 10 GM/15ML solution Take 10 g by mouth 2 (two) times daily.   Yes Historical Provider, MD  levothyroxine (SYNTHROID,  LEVOTHROID) 100 MCG tablet Take 100 mcg by mouth daily.   Yes Historical Provider, MD  metoprolol tartrate (LOPRESSOR) 25 MG tablet Take 1 tablet (25 mg total) by mouth 2 (two) times daily. 03/18/14  Yes Nita Sells, MD  Multiple Vitamin (MULITIVITAMIN WITH MINERALS) TABS Take 1 tablet by mouth at bedtime.   Yes Historical Provider, MD  oxybutynin (DITROPAN-XL) 10 MG 24 hr tablet Take 10 mg by mouth daily.   Yes Historical Provider, MD  pantoprazole (PROTONIX) 40 MG tablet Take 40 mg by mouth 2 (two) times daily. 06/02/13  Yes Historical  Provider, MD  simvastatin (ZOCOR) 20 MG tablet Take 20 mg by mouth every evening.   Yes Historical Provider, MD  temazepam (RESTORIL) 15 MG capsule Take 15 mg by mouth at bedtime as needed. Sleep   Yes Historical Provider, MD  valsartan (DIOVAN) 80 MG tablet Take 80 mg by mouth daily.   Yes Historical Provider, MD  diphenhydrAMINE (BENADRYL) 12.5 MG/5ML elixir Take 5 mLs (12.5 mg total) by mouth every 6 (six) hours as needed for itching. 09/13/13   Evalee Jefferson, PA-C   BP 149/88  Pulse 90  Temp(Src) 97.9 F (36.6 C) (Oral)  Resp 22  Wt 134 lb (60.782 kg)  SpO2 99% Physical Exam  Nursing note and vitals reviewed. Constitutional: She appears well-developed and well-nourished.  HENT:  Head: Normocephalic and atraumatic.  Eyes: Conjunctivae are normal.  Neck: Normal range of motion.  Cardiovascular: Normal rate, regular rhythm, normal heart sounds and intact distal pulses.   Pulmonary/Chest: Effort normal and breath sounds normal. She has no wheezes.  Abdominal: Soft. Bowel sounds are normal. She exhibits no distension and no mass. There is no tenderness. There is no rebound and no guarding.  Tympany to percussion  Genitourinary:  Hard stool in rectum,  No impaction.  No hemorrhoids.    Musculoskeletal: Normal range of motion.  Neurological: She is alert.  Skin: Skin is warm and dry.  Psychiatric: She has a normal mood and affect.    ED Course  Procedures (including critical care time) Labs Review Labs Reviewed  BASIC METABOLIC PANEL - Abnormal; Notable for the following:    Potassium 3.2 (*)    Glucose, Bld 264 (*)    BUN 24 (*)    Creatinine, Ser 1.33 (*)    GFR calc non Af Amer 36 (*)    GFR calc Af Amer 42 (*)    Anion gap 16 (*)    All other components within normal limits    Imaging Review Dg Abd 2 Views  04/08/2014   CLINICAL DATA:  Lower abdominal pain. Constipation for 3 days with pressure and bloating.  EXAM: ABDOMEN - 2 VIEW  COMPARISON:  CT abdomen and pelvis  06/14/2013. Abdomen series 513 2013.  FINDINGS: Stool and gas filled the colon with borderline distention, likely consistent with clinical history of constipation. No small bowel dilatation. Calcified phleboliths in the pelvis. No free intra-abdominal air. No abnormal air-fluid levels. Vascular calcifications. Degenerative changes in the spine and hips.  IMPRESSION: Stool in gas-filled colon likely to represent constipation. No evidence of bowel obstruction.   Electronically Signed   By: Lucienne Capers M.D.   On: 04/08/2014 23:13     EKG Interpretation None      MDM   Final diagnoses:  Constipation, unspecified constipation type    Patients labs and/or radiological studies were viewed and considered during the medical decision making and disposition process. Pt was given dulcolax suppository without  resultant bm.  Enema given with patient having 2 large stools and complete relief of symptoms.  She also has mild hypokalemia,  Given oral replacement.  Encouraged daily use of miralax which she currently uses prn.  F/u with pcp as needed,  Return here over the weekend for any return of sx.    Evalee Jefferson, PA-C 04/09/14 0157

## 2014-04-08 NOTE — ED Notes (Signed)
Pt reporting no relief following suppository.

## 2014-04-09 NOTE — Discharge Instructions (Signed)
Constipation °Constipation is when a person has fewer than three bowel movements a week, has difficulty having a bowel movement, or has stools that are dry, hard, or larger than normal. As people grow older, constipation is more common. If you try to fix constipation with medicines that make you have a bowel movement (laxatives), the problem may get worse. Long-term laxative use may cause the muscles of the colon to become weak. A low-fiber diet, not taking in enough fluids, and taking certain medicines may make constipation worse.  °CAUSES  °· Certain medicines, such as antidepressants, pain medicine, iron supplements, antacids, and water pills.   °· Certain diseases, such as diabetes, irritable bowel syndrome (IBS), thyroid disease, or depression.   °· Not drinking enough water.   °· Not eating enough fiber-rich foods.   °· Stress or travel.   °· Lack of physical activity or exercise.   °· Ignoring the urge to have a bowel movement.   °· Using laxatives too much.   °SIGNS AND SYMPTOMS  °· Having fewer than three bowel movements a week.   °· Straining to have a bowel movement.   °· Having stools that are hard, dry, or larger than normal.   °· Feeling full or bloated.   °· Pain in the lower abdomen.   °· Not feeling relief after having a bowel movement.   °DIAGNOSIS  °Your health care provider will take a medical history and perform a physical exam. Further testing may be done for severe constipation. Some tests may include: °· A barium enema X-ray to examine your rectum, colon, and, sometimes, your small intestine.   °· A sigmoidoscopy to examine your lower colon.   °· A colonoscopy to examine your entire colon. °TREATMENT  °Treatment will depend on the severity of your constipation and what is causing it. Some dietary treatments include drinking more fluids and eating more fiber-rich foods. Lifestyle treatments may include regular exercise. If these diet and lifestyle recommendations do not help, your health care  provider may recommend taking over-the-counter laxative medicines to help you have bowel movements. Prescription medicines may be prescribed if over-the-counter medicines do not work.  °HOME CARE INSTRUCTIONS  °· Eat foods that have a lot of fiber, such as fruits, vegetables, whole grains, and beans. °· Limit foods high in fat and processed sugars, such as french fries, hamburgers, cookies, candies, and soda.   °· A fiber supplement may be added to your diet if you cannot get enough fiber from foods.   °· Drink enough fluids to keep your urine clear or pale yellow.   °· Exercise regularly or as directed by your health care provider.   °· Go to the restroom when you have the urge to go. Do not hold it.   °· Only take over-the-counter or prescription medicines as directed by your health care provider. Do not take other medicines for constipation without talking to your health care provider first.   °SEEK IMMEDIATE MEDICAL CARE IF:  °· You have bright red blood in your stool.   °· Your constipation lasts for more than 4 days or gets worse.   °· You have abdominal or rectal pain.   °· You have thin, pencil-like stools.   °· You have unexplained weight loss. °MAKE SURE YOU:  °· Understand these instructions. °· Will watch your condition. °· Will get help right away if you are not doing well or get worse. °Document Released: 05/23/2004 Document Revised: 08/30/2013 Document Reviewed: 06/06/2013 °[employer]® Patient Information ©2015 [employer]. This information is not intended to replace advice given to you by your health care provider. Make sure you discuss any questions   you have with your health care provider.   Make sure you take your miralax daily.  Followup with your doctor or return here if you develop any further problems.

## 2014-04-09 NOTE — ED Notes (Signed)
Pt did have some relief following enema.  Reports that she is feeling less bloated.

## 2014-04-10 NOTE — ED Provider Notes (Signed)
History/physical exam/procedure(s) were performed by non-physician practitioner and as supervising physician I was immediately available for consultation/collaboration. I have reviewed all notes and am in agreement with care and plan.   Shaune Pollack, MD 04/10/14 [phone]

## 2014-06-15 ENCOUNTER — Ambulatory Visit (INDEPENDENT_AMBULATORY_CARE_PROVIDER_SITE_OTHER): Payer: Medicare Other | Admitting: Otolaryngology

## 2014-06-15 DIAGNOSIS — H903 Sensorineural hearing loss, bilateral: Secondary | ICD-10-CM

## 2014-11-29 ENCOUNTER — Emergency Department (HOSPITAL_COMMUNITY): Payer: Medicare Other

## 2014-11-29 ENCOUNTER — Encounter (HOSPITAL_COMMUNITY): Payer: Self-pay | Admitting: *Deleted

## 2014-11-29 ENCOUNTER — Inpatient Hospital Stay (HOSPITAL_COMMUNITY)
Admission: EM | Admit: 2014-11-29 | Discharge: 2014-12-03 | DRG: 287 | Disposition: A | Discharge status: Home / Self Care | Payer: Medicare Other | Attending: Cardiovascular Disease | Admitting: Cardiovascular Disease

## 2014-11-29 DIAGNOSIS — N289 Disorder of kidney and ureter, unspecified: Secondary | ICD-10-CM | POA: Diagnosis present

## 2014-11-29 DIAGNOSIS — E871 Hypo-osmolality and hyponatremia: Secondary | ICD-10-CM | POA: Diagnosis present

## 2014-11-29 DIAGNOSIS — I129 Hypertensive chronic kidney disease with stage 1 through stage 4 chronic kidney disease, or unspecified chronic kidney disease: Secondary | ICD-10-CM | POA: Diagnosis not present

## 2014-11-29 DIAGNOSIS — E11649 Type 2 diabetes mellitus with hypoglycemia without coma: Secondary | ICD-10-CM | POA: Diagnosis present

## 2014-11-29 DIAGNOSIS — I25110 Atherosclerotic heart disease of native coronary artery with unstable angina pectoris: Secondary | ICD-10-CM | POA: Diagnosis present

## 2014-11-29 DIAGNOSIS — I4891 Unspecified atrial fibrillation: Secondary | ICD-10-CM | POA: Diagnosis present

## 2014-11-29 DIAGNOSIS — M109 Gout, unspecified: Secondary | ICD-10-CM | POA: Diagnosis not present

## 2014-11-29 DIAGNOSIS — M7989 Other specified soft tissue disorders: Secondary | ICD-10-CM

## 2014-11-29 DIAGNOSIS — N182 Chronic kidney disease, stage 2 (mild): Secondary | ICD-10-CM | POA: Diagnosis present

## 2014-11-29 DIAGNOSIS — E039 Hypothyroidism, unspecified: Secondary | ICD-10-CM | POA: Diagnosis present

## 2014-11-29 DIAGNOSIS — R079 Chest pain, unspecified: Secondary | ICD-10-CM

## 2014-11-29 DIAGNOSIS — K219 Gastro-esophageal reflux disease without esophagitis: Secondary | ICD-10-CM | POA: Diagnosis present

## 2014-11-29 DIAGNOSIS — R7989 Other specified abnormal findings of blood chemistry: Secondary | ICD-10-CM

## 2014-11-29 DIAGNOSIS — R778 Other specified abnormalities of plasma proteins: Secondary | ICD-10-CM

## 2014-11-29 DIAGNOSIS — M79642 Pain in left hand: Secondary | ICD-10-CM

## 2014-11-29 HISTORY — DX: Gout, unspecified: M10.9

## 2014-11-29 LAB — TROPONIN I: TROPONIN I: 0.18 ng/mL — AB (ref <0.031)

## 2014-11-29 LAB — CBC WITH DIFFERENTIAL/PLATELET
BASOS ABS: 0 10*3/uL (ref 0.0–0.1)
BASOS PCT: 0 % (ref 0–1)
EOS ABS: 0 10*3/uL (ref 0.0–0.7)
Eosinophils Relative: 0 % (ref 0–5)
HCT: 38.7 % (ref 36.0–46.0)
Hemoglobin: 13.3 g/dL (ref 12.0–15.0)
Lymphocytes Relative: 4 % — ABNORMAL LOW (ref 12–46)
Lymphs Abs: 0.9 10*3/uL (ref 0.7–4.0)
MCH: 30.6 pg (ref 26.0–34.0)
MCHC: 34.4 g/dL (ref 30.0–36.0)
MCV: 89.2 fL (ref 78.0–100.0)
MONO ABS: 1.4 10*3/uL — AB (ref 0.1–1.0)
Monocytes Relative: 7 % (ref 3–12)
Neutro Abs: 18.8 10*3/uL — ABNORMAL HIGH (ref 1.7–7.7)
Neutrophils Relative %: 89 % — ABNORMAL HIGH (ref 43–77)
Platelets: 295 10*3/uL (ref 150–400)
RBC: 4.34 MIL/uL (ref 3.87–5.11)
RDW: 14.9 % (ref 11.5–15.5)
WBC: 21.1 10*3/uL — ABNORMAL HIGH (ref 4.0–10.5)

## 2014-11-29 LAB — BASIC METABOLIC PANEL
Anion gap: 12 (ref 5–15)
BUN: 34 mg/dL — AB (ref 6–23)
CALCIUM: 8.9 mg/dL (ref 8.4–10.5)
CO2: 21 mmol/L (ref 19–32)
CREATININE: 1.34 mg/dL — AB (ref 0.50–1.10)
Chloride: 97 mmol/L (ref 96–112)
GFR, EST AFRICAN AMERICAN: 42 mL/min — AB (ref >90)
GFR, EST NON AFRICAN AMERICAN: 36 mL/min — AB (ref >90)
GLUCOSE: 136 mg/dL — AB (ref 70–99)
Potassium: 3.5 mmol/L (ref 3.5–5.1)
SODIUM: 130 mmol/L — AB (ref 135–145)

## 2014-11-29 LAB — MRSA PCR SCREENING: MRSA BY PCR: NEGATIVE

## 2014-11-29 LAB — PROTIME-INR
INR: 1.42 (ref 0.00–1.49)
Prothrombin Time: 17.5 seconds — ABNORMAL HIGH (ref 11.6–15.2)

## 2014-11-29 LAB — APTT: APTT: 36 s (ref 24–37)

## 2014-11-29 MED ORDER — MORPHINE SULFATE 4 MG/ML IJ SOLN
4.0000 mg | Freq: Once | INTRAMUSCULAR | Status: AC
  Administered 2014-11-29: 4 mg via INTRAVENOUS
  Filled 2014-11-29: qty 1

## 2014-11-29 MED ORDER — ASPIRIN 81 MG PO CHEW
324.0000 mg | CHEWABLE_TABLET | Freq: Once | ORAL | Status: AC
  Administered 2014-11-29: 324 mg via ORAL
  Filled 2014-11-29: qty 4

## 2014-11-29 MED ORDER — CYCLOSPORINE 0.05 % OP EMUL
1.0000 [drp] | Freq: Two times a day (BID) | OPHTHALMIC | Status: DC
  Administered 2014-11-29 – 2014-12-03 (×8): 1 [drp] via OPHTHALMIC
  Filled 2014-11-29 (×10): qty 1

## 2014-11-29 MED ORDER — DILTIAZEM HCL 25 MG/5ML IV SOLN
10.0000 mg | Freq: Once | INTRAVENOUS | Status: AC
  Administered 2014-11-29: 10 mg via INTRAVENOUS
  Filled 2014-11-29: qty 5

## 2014-11-29 MED ORDER — GI COCKTAIL ~~LOC~~
30.0000 mL | Freq: Once | ORAL | Status: AC
  Administered 2014-11-29: 30 mL via ORAL
  Filled 2014-11-29: qty 30

## 2014-11-29 MED ORDER — PANTOPRAZOLE SODIUM 40 MG PO TBEC
40.0000 mg | DELAYED_RELEASE_TABLET | Freq: Two times a day (BID) | ORAL | Status: DC
Start: 2014-11-29 — End: 2014-12-03
  Administered 2014-11-29 – 2014-12-03 (×8): 40 mg via ORAL
  Filled 2014-11-29 (×8): qty 1

## 2014-11-29 MED ORDER — OXYBUTYNIN CHLORIDE ER 10 MG PO TB24
10.0000 mg | ORAL_TABLET | Freq: Every day | ORAL | Status: DC
  Administered 2014-11-30 – 2014-12-03 (×4): 10 mg via ORAL
  Filled 2014-11-29 (×4): qty 1

## 2014-11-29 MED ORDER — SIMVASTATIN 20 MG PO TABS
20.0000 mg | ORAL_TABLET | Freq: Every evening | ORAL | Status: DC
  Administered 2014-11-29 – 2014-11-30 (×2): 20 mg via ORAL
  Filled 2014-11-29 (×3): qty 1

## 2014-11-29 MED ORDER — DIPHENHYDRAMINE HCL 12.5 MG/5ML PO ELIX
12.5000 mg | ORAL_SOLUTION | Freq: Four times a day (QID) | ORAL | Status: DC | PRN
  Filled 2014-11-29: qty 5

## 2014-11-29 MED ORDER — IRBESARTAN 150 MG PO TABS
150.0000 mg | ORAL_TABLET | Freq: Every day | ORAL | Status: DC
  Filled 2014-11-29 (×2): qty 1

## 2014-11-29 MED ORDER — HEPARIN (PORCINE) IN NACL 100-0.45 UNIT/ML-% IJ SOLN
950.0000 [IU]/h | INTRAMUSCULAR | Status: DC
  Administered 2014-11-29: 650 [IU]/h via INTRAVENOUS
  Filled 2014-11-29 (×4): qty 250

## 2014-11-29 MED ORDER — METOPROLOL SUCCINATE ER 25 MG PO TB24
25.0000 mg | ORAL_TABLET | Freq: Two times a day (BID) | ORAL | Status: DC
  Administered 2014-11-29 – 2014-12-03 (×6): 25 mg via ORAL
  Filled 2014-11-29 (×9): qty 1

## 2014-11-29 MED ORDER — POTASSIUM CHLORIDE CRYS ER 10 MEQ PO TBCR
10.0000 meq | EXTENDED_RELEASE_TABLET | Freq: Two times a day (BID) | ORAL | Status: DC
  Administered 2014-11-29 – 2014-11-30 (×3): 10 meq via ORAL
  Filled 2014-11-29 (×5): qty 1

## 2014-11-29 MED ORDER — DILTIAZEM HCL 30 MG PO TABS
30.0000 mg | ORAL_TABLET | Freq: Four times a day (QID) | ORAL | Status: DC
Start: 2014-11-30 — End: 2014-12-02
  Administered 2014-11-29 – 2014-12-02 (×7): 30 mg via ORAL
  Filled 2014-11-29 (×14): qty 1

## 2014-11-29 MED ORDER — GLIMEPIRIDE 2 MG PO TABS
2.0000 mg | ORAL_TABLET | Freq: Every day | ORAL | Status: DC
  Administered 2014-12-01: 2 mg via ORAL
  Filled 2014-11-29 (×3): qty 1

## 2014-11-29 MED ORDER — HEPARIN BOLUS VIA INFUSION
3000.0000 [IU] | Freq: Once | INTRAVENOUS | Status: AC
  Administered 2014-11-29: 3000 [IU] via INTRAVENOUS

## 2014-11-29 MED ORDER — DILTIAZEM HCL 100 MG IV SOLR
5.0000 mg/h | Freq: Once | INTRAVENOUS | Status: AC
  Administered 2014-11-29: 5 mg/h via INTRAVENOUS
  Filled 2014-11-29: qty 100

## 2014-11-29 MED ORDER — TRAMADOL HCL 50 MG PO TABS
50.0000 mg | ORAL_TABLET | Freq: Three times a day (TID) | ORAL | Status: DC | PRN
  Administered 2014-11-30 – 2014-12-02 (×3): 50 mg via ORAL
  Filled 2014-11-29 (×5): qty 1

## 2014-11-29 MED ORDER — LACTULOSE 10 GM/15ML PO SOLN
10.0000 g | Freq: Two times a day (BID) | ORAL | Status: DC
  Administered 2014-11-29 – 2014-12-03 (×6): 10 g via ORAL
  Filled 2014-11-29 (×9): qty 15

## 2014-11-29 MED ORDER — DONEPEZIL HCL 5 MG PO TABS
5.0000 mg | ORAL_TABLET | Freq: Every day | ORAL | Status: DC
  Administered 2014-11-29 – 2014-12-03 (×5): 5 mg via ORAL
  Filled 2014-11-29 (×5): qty 1

## 2014-11-29 NOTE — H&P (Signed)
Referring Physician: Dr. Bernadene Bell  [name] [name] is an 81 y.o. female.                       Chief Complaint: Chest pain and left hand swelling  HPI: 81 y.o. female with PMHx of DM, HTN, CAD, diastolic dysfunction, renal insufficiency, and gout who presents to the Emergency Department complaining of left hand pain with first onset 2 months ago, worsening today. Pt also reports 8/10 burning chest pain with onset yesterday. She states it feels "like a toothache." Pt notes associated SOB last night which has resolved. Pt notes lying down makes the pain feel better. Pt denies nausea and diaphoresis. She also had atrial fibrillation with rapid ventricular response improving with diltiazem drip.   Past Medical History  Diagnosis Date  . Diabetes mellitus   . Hypertension   . Clostridium difficile colitis 01/2013  . Hiatal hernia   . Renal insufficiency   . Hypothyroid   . Left renal artery stenosis   . GERD (gastroesophageal reflux disease)   . Coronary artery disease   . Diverticulitis   . Uterine fibroid   . Shingles   . Diastolic dysfunction   . Gout       Past Surgical History  Procedure Laterality Date  . Renal artery stent Left     No family history on file. Social History:  reports that she has never smoked. She does not have any smokeless tobacco history on file. She reports that she does not drink alcohol or use illicit drugs.  Allergies: No Known Allergies  Medications Prior to Admission  Medication Sig Dispense Refill  . clopidogrel (PLAVIX) 75 MG tablet Take 75 mg by mouth daily.    . cycloSPORINE (RESTASIS) 0.05 % ophthalmic emulsion Place 1 drop into both eyes 2 (two) times daily.    Marland Kitchen donepezil (ARICEPT) 5 MG tablet Take 5 mg by mouth daily.    . furosemide (LASIX) 40 MG tablet Take 40 mg by mouth 2 (two) times daily.    Marland Kitchen glimepiride (AMARYL) 2 MG tablet Take 2 mg by mouth daily.    Marland Kitchen lactulose (CHRONULAC) 10 GM/15ML solution Take 10 g by mouth 2 (two) times  daily.    . metoprolol succinate (TOPROL-XL) 25 MG 24 hr tablet Take 25 mg by mouth 2 (two) times daily.  4  . oxybutynin (DITROPAN-XL) 10 MG 24 hr tablet Take 10 mg by mouth daily.    . pantoprazole (PROTONIX) 40 MG tablet Take 40 mg by mouth 2 (two) times daily.    . potassium chloride (MICRO-K) 10 MEQ CR capsule Take 10 mEq by mouth 2 (two) times daily.  4  . simvastatin (ZOCOR) 20 MG tablet Take 20 mg by mouth every evening.    . temazepam (RESTORIL) 15 MG capsule Take 15 mg by mouth at bedtime as needed. Sleep    . traMADol (ULTRAM) 50 MG tablet Take 50 mg by mouth 3 (three) times daily as needed. pain  2  . valsartan (DIOVAN) 80 MG tablet Take 80 mg by mouth daily.    . Vitamin D, Ergocalciferol, (DRISDOL) 50000 UNITS CAPS capsule Take 50,000 Units by mouth every 7 (seven) days.    Marland Kitchen amLODipine (NORVASC) 5 MG tablet Take 5 mg by mouth 2 (two) times a week.     . colchicine 0.6 MG tablet Take 0.5 tablets (0.3 mg total) by mouth 2 (two) times daily. (Patient not taking: Reported on 11/29/2014) 60 tablet  0  . diphenhydrAMINE (BENADRYL) 12.5 MG/5ML elixir Take 5 mLs (12.5 mg total) by mouth every 6 (six) hours as needed for itching. 120 mL 0  . metoprolol tartrate (LOPRESSOR) 25 MG tablet Take 1 tablet (25 mg total) by mouth 2 (two) times daily. (Patient not taking: Reported on 11/29/2014) 60 tablet 0    Results for orders placed or performed during the hospital encounter of 11/29/14 (from the past 48 hour(s))  CBC with Differential     Status: Abnormal   Collection Time: 11/29/14  4:45 PM  Result Value Ref Range   WBC 21.1 (H) 4.0 - 10.5 K/uL   RBC 4.34 3.87 - 5.11 MIL/uL   Hemoglobin 13.3 12.0 - 15.0 g/dL   HCT 38.7 36.0 - 46.0 %   MCV 89.2 78.0 - 100.0 fL   MCH 30.6 26.0 - 34.0 pg   MCHC 34.4 30.0 - 36.0 g/dL   RDW 14.9 11.5 - 15.5 %   Platelets 295 150 - 400 K/uL   Neutrophils Relative % 89 (H) 43 - 77 %   Neutro Abs 18.8 (H) 1.7 - 7.7 K/uL   Lymphocytes Relative 4 (L) 12 - 46 %    Lymphs Abs 0.9 0.7 - 4.0 K/uL   Monocytes Relative 7 3 - 12 %   Monocytes Absolute 1.4 (H) 0.1 - 1.0 K/uL   Eosinophils Relative 0 0 - 5 %   Eosinophils Absolute 0.0 0.0 - 0.7 K/uL   Basophils Relative 0 0 - 1 %   Basophils Absolute 0.0 0.0 - 0.1 K/uL  Basic metabolic panel     Status: Abnormal   Collection Time: 11/29/14  5:16 PM  Result Value Ref Range   Sodium 130 (L) 135 - 145 mmol/L   Potassium 3.5 3.5 - 5.1 mmol/L   Chloride 97 96 - 112 mmol/L   CO2 21 19 - 32 mmol/L   Glucose, Bld 136 (H) 70 - 99 mg/dL   BUN 34 (H) 6 - 23 mg/dL   Creatinine, Ser 1.34 (H) 0.50 - 1.10 mg/dL   Calcium 8.9 8.4 - 10.5 mg/dL   GFR calc non Af Amer 36 (L) >90 mL/min   GFR calc Af Amer 42 (L) >90 mL/min    Comment: (NOTE) The eGFR has been calculated using the CKD EPI equation. This calculation has not been validated in all clinical situations. eGFR's persistently <90 mL/min signify possible Chronic Kidney Disease.    Anion gap 12 5 - 15  Troponin I     Status: Abnormal   Collection Time: 11/29/14  5:16 PM  Result Value Ref Range   Troponin I 0.18 (H) <0.031 ng/mL    Comment:        PERSISTENTLY INCREASED TROPONIN VALUES IN THE RANGE OF 0.04-0.49 ng/mL CAN BE SEEN IN:       -UNSTABLE ANGINA       -CONGESTIVE HEART FAILURE       -MYOCARDITIS       -CHEST TRAUMA       -ARRYHTHMIAS       -LATE PRESENTING MYOCARDIAL INFARCTION       -COPD   CLINICAL FOLLOW-UP RECOMMENDED.   Protime-INR     Status: Abnormal   Collection Time: 11/29/14  5:16 PM  Result Value Ref Range   Prothrombin Time 17.5 (H) 11.6 - 15.2 seconds   INR 1.42 0.00 - 1.49  APTT     Status: None   Collection Time: 11/29/14  5:16 PM  Result Value Ref  Range   aPTT 36 24 - 37 seconds   Dg Chest 1 View  11/29/2014   CLINICAL DATA:  Acute burning chest pain  EXAM: CHEST  1 VIEW  COMPARISON:  03/16/2014  FINDINGS: Rotated exam to the right. Stable cardiomegaly noted with central vascular congestion but no CHF pattern or  pneumonia. No collapse, consolidation, effusion, or pneumothorax. Atherosclerotic ectatic aorta evident. Degenerative changes of the spine. Apical pleural scarring bilaterally. No acute osseous finding.  IMPRESSION: Cardiomegaly without CHF or pneumonia.  Overall stable exam.   Electronically Signed   By: Jerilynn Mages.  Shick M.D.   On: 11/29/2014 17:06   Dg Hand Complete Left  11/29/2014   CLINICAL DATA:  Left hand swelling, pain.  EXAM: LEFT HAND - COMPLETE 3+ VIEW  COMPARISON:  None.  FINDINGS: Posterior soft tissue swelling. No underlying acute bony abnormality. No fracture, subluxation or dislocation. Joint spaces are maintained.  IMPRESSION: Posterior soft tissue swelling.  No acute bony abnormality.   Electronically Signed   By: Rolm Baptise M.D.   On: 11/29/2014 17:07    Review Of Systems Constitutional: Negative for diaphoresis.  Respiratory: Positive for shortness of breath.  Cardiovascular: Positive for chest pain.  Gastrointestinal: Negative for nausea.  Musculoskeletal: Positive for myalgias and joint swelling.  All other systems reviewed and are negative.  Blood pressure 99/51, pulse 79, temperature 97.7 F (36.5 C), temperature source Oral, resp. rate 21, height $RemoveBe'5\' 4"'[id number]$  (1.626 m), weight 56.7 kg (125 lb), SpO2 100 %. Physical Exam  Constitutional: She is oriented to person, place, and time. She appears well-developed and well-nourished.  HENT: Head: Normocephalic, atruamatic.  Brown eyes, conjunctivae are normal. Pupils are equal, round, and reactive to light.  Neck: Normal range of motion. Neck supple. No JVD present.  Cardiovascular: Irregular rate and rhythm. Normal heart sounds.II/VI systolic murmur heard.  Pulmonary/Chest: Effort normal and breath sounds normal.   Abdominal: Soft. Bowel sounds are normal. She exhibits no distension and no mass. There is no tenderness.  Musculoskeletal: Normal range of motion. She exhibits left wrist and hand tenderness and swelling.  Lymphadenopathy:  She has no cervical adenopathy.  Neurological: She is alert and oriented to person, place, and time. No cranial nerve deficit. Coordination normal.  Skin: Skin is warm and dry. No rash noted.  Psychiatric: She has a normal mood and affect. Her behavior is normal. Thought content normal.  Nursing note and vitals reviewed.  Assessment/Plan Chest pain r/o MI Atrial fibrillation with rapid ventricular response. DM, II Hypertension CAD CKD, II Hypothyroidism  Admit Discussed Cardiac Cath, TEE with cardioversion for A. Fibrillation. Patient prefers medical therapy for now.  Birdie Riddle, MD  11/29/2014, 9:36 PM

## 2014-11-29 NOTE — Progress Notes (Signed)
ANTICOAGULATION CONSULT NOTE - Initial Consult  Pharmacy Consult for Heparin Indication: chest pain/ACS  No Known Allergies  Patient Measurements: Height: 5\' 4"  (162.6 cm) Weight: 125 lb (56.7 kg) IBW/kg (Calculated) : 54.7 Heparin Dosing Weight: 56.7 kg  Vital Signs: Temp: 98.1 F (36.7 C) (03/23 1603) Temp Source: Oral (03/23 1603) BP: 109/74 mmHg (03/23 1730) Pulse Rate: 86 (03/23 1823)  Labs:  Recent Labs  11/29/14 1645 11/29/14 1716  HGB 13.3  --   HCT 38.7  --   PLT 295  --   CREATININE  --  1.34*  TROPONINI  --  0.18*    Estimated Creatinine Clearance: 28.4 mL/min (by C-G formula based on Cr of 1.34).   Medical History: Past Medical History  Diagnosis Date  . Diabetes mellitus   . Hypertension   . Clostridium difficile colitis 01/2013  . Hiatal hernia   . Renal insufficiency   . Hypothyroid   . Left renal artery stenosis   . GERD (gastroesophageal reflux disease)   . Coronary artery disease   . Diverticulitis   . Uterine fibroid   . Shingles   . Diastolic dysfunction   . Gout     Medications:   (Not in a hospital admission)  Home Meds Reviewed  Assessment: Okay for Protocol, Baseline coag labs pending.  Goal of Therapy:  Heparin level 0.3-0.7 units/ml Monitor platelets by anticoagulation protocol: Yes   Plan:  Give 3000 units bolus x 1 Start heparin infusion at 650 units/hr Check anti-Xa level in 6-8 hours and daily while on heparin Continue to monitor H&H and platelets  Pricilla Larsson 11/29/2014,6:31 PM

## 2014-11-29 NOTE — ED Notes (Signed)
Report given to RN at [facility].  [facility] to department to transport pt.

## 2014-11-29 NOTE — ED Provider Notes (Signed)
CSN: [id number]     Arrival date & time 11/29/14  1559 History  This chart was scribed for Delora Fuel, MD by Chester Holstein, ED Scribe. This patient was seen in room APA10/APA10 and the patient's care was started at 4:15 PM.    Chief Complaint  Patient presents with  . Hand Pain   The history is provided by the patient. No language interpreter was used.   HPI Comments: [name] [name] is a 81 y.o. female with PMHx of DM, HTN, CAD, diastolic dysfunction, renal insufficiency, and gout who presents to the Emergency Department complaining of left hand pain with first onset 2 months ago, worsening today. Pt notes associated warmth and swelling to wrist, dorsal aspect of hand, and fingers. Pt was seen by PCP, Dr. Katherine Roan, 5 days ago and diagnosed with gout. Pt's son notes she was prescribed a new pain medication, but he is unsure of the name.  Pt also reports 8/10 burning chest pain with onset yesterday. She states it feels "like a toothache." Pt notes associated SOB last night which has resolved. Pt notes lying down makes the pain feel better. Pt denies nausea and diaphoresis.  Past Medical History  Diagnosis Date  . Diabetes mellitus   . Hypertension   . Clostridium difficile colitis 01/2013  . Hiatal hernia   . Renal insufficiency   . Hypothyroid   . Left renal artery stenosis   . GERD (gastroesophageal reflux disease)   . Coronary artery disease   . Diverticulitis   . Uterine fibroid   . Shingles   . Diastolic dysfunction   . Gout    Past Surgical History  Procedure Laterality Date  . Renal artery stent Left    No family history on file. History  Substance Use Topics  . Smoking status: Never Smoker   . Smokeless tobacco: Not on file  . Alcohol Use: No   OB History    No data available     Review of Systems  Constitutional: Negative for diaphoresis.  Respiratory: Positive for shortness of breath (resolved).   Cardiovascular: Positive for chest pain.  Gastrointestinal:  Negative for nausea.  Musculoskeletal: Positive for myalgias and joint swelling.  All other systems reviewed and are negative.     Allergies  Review of patient's allergies indicates no known allergies.  Home Medications   Prior to Admission medications   Medication Sig Start Date End Date Taking? Authorizing Provider  amLODipine (NORVASC) 5 MG tablet Take 5 mg by mouth daily. 05/21/13   Historical Provider, MD  clopidogrel (PLAVIX) 75 MG tablet Take 75 mg by mouth daily.    Historical Provider, MD  colchicine 0.6 MG tablet Take 0.5 tablets (0.3 mg total) by mouth 2 (two) times daily. 03/18/14   Nita Sells, MD  cycloSPORINE (RESTASIS) 0.05 % ophthalmic emulsion Place 1 drop into both eyes 2 (two) times daily.    Historical Provider, MD  diphenhydrAMINE (BENADRYL) 12.5 MG/5ML elixir Take 5 mLs (12.5 mg total) by mouth every 6 (six) hours as needed for itching. 09/13/13   Evalee Jefferson, PA-C  donepezil (ARICEPT) 5 MG tablet Take 5 mg by mouth daily. 05/17/13   Historical Provider, MD  furosemide (LASIX) 40 MG tablet Take 40 mg by mouth 2 (two) times daily.    Historical Provider, MD  glimepiride (AMARYL) 2 MG tablet Take 2 mg by mouth daily. 05/29/13   Historical Provider, MD  lactulose (CHRONULAC) 10 GM/15ML solution Take 10 g by mouth 2 (two) times daily.  Historical Provider, MD  levothyroxine (SYNTHROID, LEVOTHROID) 100 MCG tablet Take 100 mcg by mouth daily.    Historical Provider, MD  metoprolol tartrate (LOPRESSOR) 25 MG tablet Take 1 tablet (25 mg total) by mouth 2 (two) times daily. 03/18/14   Nita Sells, MD  Multiple Vitamin (MULITIVITAMIN WITH MINERALS) TABS Take 1 tablet by mouth at bedtime.    Historical Provider, MD  oxybutynin (DITROPAN-XL) 10 MG 24 hr tablet Take 10 mg by mouth daily.    Historical Provider, MD  pantoprazole (PROTONIX) 40 MG tablet Take 40 mg by mouth 2 (two) times daily. 06/02/13   Historical Provider, MD  simvastatin (ZOCOR) 20 MG tablet Take 20 mg  by mouth every evening.    Historical Provider, MD  temazepam (RESTORIL) 15 MG capsule Take 15 mg by mouth at bedtime as needed. Sleep    Historical Provider, MD  valsartan (DIOVAN) 80 MG tablet Take 80 mg by mouth daily.    Historical Provider, MD   BP 125/73 mmHg  Pulse 132  Temp(Src) 98.1 F (36.7 C) (Oral)  Resp 20  Ht 5\' 4"  (1.626 m)  Wt 125 lb (56.7 kg)  BMI 21.45 kg/m2  SpO2 98% Physical Exam  Constitutional: She is oriented to person, place, and time. She appears well-developed and well-nourished.  HENT:  Head: Normocephalic.  Eyes: Conjunctivae are normal. Pupils are equal, round, and reactive to light.  Neck: Normal range of motion. Neck supple. No JVD present.  Cardiovascular: Normal heart sounds.   No murmur heard. Tachycardic  Pulmonary/Chest: Effort normal and breath sounds normal. She has no wheezes. She has no rales. She exhibits no tenderness.  Abdominal: Soft. Bowel sounds are normal. She exhibits no distension and no mass. There is no tenderness.  Musculoskeletal: Normal range of motion. She exhibits no edema.       Left wrist: She exhibits tenderness and swelling.       Left hand: She exhibits tenderness and swelling (moderate with warmth diffusely). She exhibits normal capillary refill (prompt).  Lymphadenopathy:    She has no cervical adenopathy.  Neurological: She is alert and oriented to person, place, and time. No cranial nerve deficit. Coordination normal.  Skin: Skin is warm and dry. No rash noted.  Psychiatric: She has a normal mood and affect. Her behavior is normal. Thought content normal.  Nursing note and vitals reviewed.   ED Course  Procedures (including critical care time) DIAGNOSTIC STUDIES: Oxygen Saturation is 98% on room air, normal by my interpretation.    COORDINATION OF CARE: 4:28 PM Discussed treatment plan with patient at beside, the patient agrees with the plan and has no further questions at this time.   Labs Review Results for  orders placed or performed during the hospital encounter of 25/95/63  Basic metabolic panel  Result Value Ref Range   Sodium 130 (L) 135 - 145 mmol/L   Potassium 3.5 3.5 - 5.1 mmol/L   Chloride 97 96 - 112 mmol/L   CO2 21 19 - 32 mmol/L   Glucose, Bld 136 (H) 70 - 99 mg/dL   BUN 34 (H) 6 - 23 mg/dL   Creatinine, Ser 1.34 (H) 0.50 - 1.10 mg/dL   Calcium 8.9 8.4 - 10.5 mg/dL   GFR calc non Af Amer 36 (L) >90 mL/min   GFR calc Af Amer 42 (L) >90 mL/min   Anion gap 12 5 - 15  CBC with Differential  Result Value Ref Range   WBC 21.1 (H) 4.0 - 10.5  K/uL   RBC 4.34 3.87 - 5.11 MIL/uL   Hemoglobin 13.3 12.0 - 15.0 g/dL   HCT 38.7 36.0 - 46.0 %   MCV 89.2 78.0 - 100.0 fL   MCH 30.6 26.0 - 34.0 pg   MCHC 34.4 30.0 - 36.0 g/dL   RDW 14.9 11.5 - 15.5 %   Platelets 295 150 - 400 K/uL   Neutrophils Relative % 89 (H) 43 - 77 %   Neutro Abs 18.8 (H) 1.7 - 7.7 K/uL   Lymphocytes Relative 4 (L) 12 - 46 %   Lymphs Abs 0.9 0.7 - 4.0 K/uL   Monocytes Relative 7 3 - 12 %   Monocytes Absolute 1.4 (H) 0.1 - 1.0 K/uL   Eosinophils Relative 0 0 - 5 %   Eosinophils Absolute 0.0 0.0 - 0.7 K/uL   Basophils Relative 0 0 - 1 %   Basophils Absolute 0.0 0.0 - 0.1 K/uL  Troponin I  Result Value Ref Range   Troponin I 0.18 (H) <0.031 ng/mL    Imaging Review Dg Chest 1 View  11/29/2014   CLINICAL DATA:  Acute burning chest pain  EXAM: CHEST  1 VIEW  COMPARISON:  03/16/2014  FINDINGS: Rotated exam to the right. Stable cardiomegaly noted with central vascular congestion but no CHF pattern or pneumonia. No collapse, consolidation, effusion, or pneumothorax. Atherosclerotic ectatic aorta evident. Degenerative changes of the spine. Apical pleural scarring bilaterally. No acute osseous finding.  IMPRESSION: Cardiomegaly without CHF or pneumonia.  Overall stable exam.   Electronically Signed   By: Jerilynn Mages.  Shick M.D.   On: 11/29/2014 17:06   Dg Hand Complete Left  11/29/2014   CLINICAL DATA:  Left hand swelling, pain.   EXAM: LEFT HAND - COMPLETE 3+ VIEW  COMPARISON:  None.  FINDINGS: Posterior soft tissue swelling. No underlying acute bony abnormality. No fracture, subluxation or dislocation. Joint spaces are maintained.  IMPRESSION: Posterior soft tissue swelling.  No acute bony abnormality.   Electronically Signed   By: Rolm Baptise M.D.   On: 11/29/2014 17:07     EKG Interpretation   Date/Time:  Wednesday November 29 2014 16:35:44 EDT Ventricular Rate:  133 PR Interval:    QRS Duration: 80 QT Interval:  348 QTC Calculation: 517 R Axis:   -19 Text Interpretation:  Atrial fibrillation with rapid ventricular response  with premature ventricular or aberrantly conducted complexes Low voltage  QRS Inferior infarct , age undetermined Abnormal ECG When compared with  ECG of 03/16/2014, Atrial fibrillation with rapid ventricular response has  replaced Sinus rhythm Confirmed by Roxanne Mins  MD, Enedelia Martorelli ([id number]) on 11/29/2014  4:50:13 PM      CRITICAL CARE Performed by: YBOFB,PZWCH Total critical care time: 45 minutes Critical care time was exclusive of separately billable procedures and treating other patients. Critical care was necessary to treat or prevent imminent or life-threatening deterioration. Critical care was time spent personally by me on the following activities: development of treatment plan with patient and/or surrogate as well as nursing, discussions with consultants, evaluation of patient's response to treatment, examination of patient, obtaining history from patient or surrogate, ordering and performing treatments and interventions, ordering and review of laboratory studies, ordering and review of radiographic studies, pulse oximetry and re-evaluation of patient's condition.  MDM   Final diagnoses:  Chest pain, unspecified chest pain type  Atrial fibrillation with rapid ventricular response  Elevated troponin  Renal insufficiency  Hyponatremia  Pain of left hand  Swelling of left hand  Left  hand pain and swelling which could be due to gout. Old records are reviewed and she did have a uric acid level done last year which was significantly elevated at 12.9. Chest pain is of uncertain cause. ECG shows atrial flutter/fibrillation which could be related to her chest pain. She will need to be given medication for rate control. Review of old ECGs shows none with atrial fibrillation.  Troponin has come back elevated, so she is started on anticoagulation with heparin. Heart rate has come down to about 114 with diltiazem but she has not noticed any improvement in her chest pain. Mild hyponatremia is noted of uncertain clinical significance. She has mild renal insufficiency which is not significantly changed from baseline. Significantly elevated WBC with left shift I feel is more related to her cardiac event and her hand but it's impossible to be sure. Case is discussed with Dr. Doylene Canard of cardiology service at [facility] who agrees to accept the patient in transfer.  I personally performed the services described in this documentation, which was scribed in my presence. The recorded information has been reviewed and is accurate.       Delora Fuel, MD [id number]

## 2014-11-29 NOTE — ED Notes (Signed)
Pt states she was seen by her PCP last Friday and dx with gout to the left hand. Left and and wrist area are warm and swollen. Pt unable to states exactly when swelling began but states it has gotten worse.

## 2014-11-30 ENCOUNTER — Encounter (HOSPITAL_COMMUNITY)
Admission: EM | Disposition: A | Discharge status: Home / Self Care | Payer: Self-pay | Source: Home / Self Care | Attending: Cardiovascular Disease

## 2014-11-30 HISTORY — PX: LEFT HEART CATHETERIZATION WITH CORONARY ANGIOGRAM: SHX5451

## 2014-11-30 LAB — HEPARIN LEVEL (UNFRACTIONATED): HEPARIN UNFRACTIONATED: 0.1 [IU]/mL — AB (ref 0.30–0.70)

## 2014-11-30 LAB — CBC
HEMATOCRIT: 35.2 % — AB (ref 36.0–46.0)
Hemoglobin: 12 g/dL (ref 12.0–15.0)
MCH: 29.8 pg (ref 26.0–34.0)
MCHC: 34.1 g/dL (ref 30.0–36.0)
MCV: 87.3 fL (ref 78.0–100.0)
Platelets: 263 10*3/uL (ref 150–400)
RBC: 4.03 MIL/uL (ref 3.87–5.11)
RDW: 15 % (ref 11.5–15.5)
WBC: 16 10*3/uL — ABNORMAL HIGH (ref 4.0–10.5)

## 2014-11-30 LAB — GLUCOSE, CAPILLARY
Glucose-Capillary: 60 mg/dL — ABNORMAL LOW (ref 70–99)
Glucose-Capillary: 66 mg/dL — ABNORMAL LOW (ref 70–99)
Glucose-Capillary: 116 mg/dL — ABNORMAL HIGH (ref 70–99)

## 2014-11-30 LAB — POCT ACTIVATED CLOTTING TIME: Activated Clotting Time: 116 seconds

## 2014-11-30 SURGERY — LEFT HEART CATHETERIZATION WITH CORONARY ANGIOGRAM
Anesthesia: LOCAL

## 2014-11-30 MED ORDER — SODIUM CHLORIDE 0.9 % IV SOLN
250.0000 mL | INTRAVENOUS | Status: DC | PRN
  Administered 2014-12-01: 250 mL via INTRAVENOUS

## 2014-11-30 MED ORDER — SODIUM CHLORIDE 0.9 % IV SOLN
Freq: Once | INTRAVENOUS | Status: AC
  Administered 2014-11-30: 10:00:00 via INTRAVENOUS

## 2014-11-30 MED ORDER — ASPIRIN 81 MG PO CHEW
81.0000 mg | CHEWABLE_TABLET | ORAL | Status: AC
  Administered 2014-12-01: 81 mg via ORAL
  Filled 2014-11-30: qty 1

## 2014-11-30 MED ORDER — LIDOCAINE HCL (PF) 1 % IJ SOLN
INTRAMUSCULAR | Status: AC
  Filled 2014-11-30: qty 30

## 2014-11-30 MED ORDER — MIDAZOLAM HCL 2 MG/2ML IJ SOLN
INTRAMUSCULAR | Status: AC
Start: 2014-11-30 — End: 2014-11-30
  Filled 2014-11-30: qty 2

## 2014-11-30 MED ORDER — HEPARIN BOLUS VIA INFUSION
2000.0000 [IU] | Freq: Once | INTRAVENOUS | Status: DC
  Filled 2014-11-30: qty 2000

## 2014-11-30 MED ORDER — SODIUM CHLORIDE 0.9 % IJ SOLN
3.0000 mL | Freq: Two times a day (BID) | INTRAMUSCULAR | Status: DC
  Administered 2014-11-30 – 2014-12-03 (×6): 3 mL via INTRAVENOUS

## 2014-11-30 MED ORDER — SODIUM CHLORIDE 0.9 % IJ SOLN: 3.0000 mL | INTRAMUSCULAR | Status: DC | PRN

## 2014-11-30 MED ORDER — SODIUM CHLORIDE 0.9 % IV SOLN
INTRAVENOUS | Status: DC
  Administered 2014-11-30: 14:00:00 via INTRAVENOUS

## 2014-11-30 MED ORDER — HEPARIN (PORCINE) IN NACL 2-0.9 UNIT/ML-% IJ SOLN
INTRAMUSCULAR | Status: AC
  Filled 2014-11-30: qty 1000

## 2014-11-30 MED ORDER — SODIUM CHLORIDE 0.9 % IV SOLN
1.0000 mL/kg/h | INTRAVENOUS | Status: AC
  Administered 2014-11-30: 1 mL/kg/h via INTRAVENOUS

## 2014-11-30 MED ORDER — FENTANYL CITRATE 0.05 MG/ML IJ SOLN
INTRAMUSCULAR | Status: AC
  Filled 2014-11-30: qty 2

## 2014-11-30 NOTE — Progress Notes (Signed)
  Echocardiogram 2D Echocardiogram has been performed.  [name] [name] M 11/30/2014, 3:09 PM

## 2014-11-30 NOTE — Progress Notes (Addendum)
Pt's BP went down into 70's at 0600. Rechecked several times with last BP 76/54 (61). Manual BP was 88/54. Pt is asymptomatic and says she feels "OK". Will continue to monitor.

## 2014-11-30 NOTE — Progress Notes (Signed)
Drank 8oz apple juice.

## 2014-11-30 NOTE — Progress Notes (Signed)
ANTICOAGULATION CONSULT NOTE - Initial Consult  Pharmacy Consult for Heparin Indication: chest pain/ACS  No Known Allergies  Patient Measurements: Height: 5\' 4"  (162.6 cm) Weight: 125 lb (56.7 kg) IBW/kg (Calculated) : 54.7 Heparin Dosing Weight: 56.7 kg  Vital Signs: Temp: 97.7 F (36.5 C) (03/24 0423) Temp Source: Oral (03/24 0422) BP: 81/53 mmHg (03/24 0600) Pulse Rate: 78 (03/24 0600)  Labs:  Recent Labs  11/29/14 1645 11/29/14 1716 11/30/14 0422  HGB 13.3  --  12.0  HCT 38.7  --  35.2*  PLT 295  --  263  APTT  --  36  --   LABPROT  --  17.5*  --   INR  --  1.42  --   HEPARINUNFRC  --   --  0.10*  CREATININE  --  1.34*  --   TROPONINI  --  0.18*  --     Estimated Creatinine Clearance: 28.4 mL/min (by C-G formula based on Cr of 1.34).   Medical History: Past Medical History  Diagnosis Date  . Diabetes mellitus   . Hypertension   . Clostridium difficile colitis 01/2013  . Hiatal hernia   . Renal insufficiency   . Hypothyroid   . Left renal artery stenosis   . GERD (gastroesophageal reflux disease)   . Coronary artery disease   . Diverticulitis   . Uterine fibroid   . Shingles   . Diastolic dysfunction   . Gout     Medications:  Prescriptions prior to admission  Medication Sig Dispense Refill Last Dose  . clopidogrel (PLAVIX) 75 MG tablet Take 75 mg by mouth daily.   11/29/2014 at Unknown time  . cycloSPORINE (RESTASIS) 0.05 % ophthalmic emulsion Place 1 drop into both eyes 2 (two) times daily.   11/29/2014 at Unknown time  . donepezil (ARICEPT) 5 MG tablet Take 5 mg by mouth daily.   11/28/2014 at Unknown time  . furosemide (LASIX) 40 MG tablet Take 40 mg by mouth 2 (two) times daily.   11/29/2014 at Unknown time  . glimepiride (AMARYL) 2 MG tablet Take 2 mg by mouth daily.   11/29/2014 at Unknown time  . lactulose (CHRONULAC) 10 GM/15ML solution Take 10 g by mouth 2 (two) times daily.   11/29/2014 at Unknown time  . metoprolol succinate (TOPROL-XL) 25  MG 24 hr tablet Take 25 mg by mouth 2 (two) times daily.  4 11/29/2014 at 800  . oxybutynin (DITROPAN-XL) 10 MG 24 hr tablet Take 10 mg by mouth daily.   11/29/2014 at Unknown time  . pantoprazole (PROTONIX) 40 MG tablet Take 40 mg by mouth 2 (two) times daily.   11/29/2014 at Unknown time  . potassium chloride (MICRO-K) 10 MEQ CR capsule Take 10 mEq by mouth 2 (two) times daily.  4 11/29/2014 at Unknown time  . simvastatin (ZOCOR) 20 MG tablet Take 20 mg by mouth every evening.   11/28/2014 at Unknown time  . temazepam (RESTORIL) 15 MG capsule Take 15 mg by mouth at bedtime as needed. Sleep   Past Week at Unknown time  . traMADol (ULTRAM) 50 MG tablet Take 50 mg by mouth 3 (three) times daily as needed. pain  2 11/29/2014 at Unknown time  . valsartan (DIOVAN) 80 MG tablet Take 80 mg by mouth daily.   11/29/2014 at Unknown time  . Vitamin D, Ergocalciferol, (DRISDOL) 50000 UNITS CAPS capsule Take 50,000 Units by mouth every 7 (seven) days.   11/20/2014  . amLODipine (NORVASC) 5 MG tablet Take  5 mg by mouth 2 (two) times a week.    11/27/2014  . colchicine 0.6 MG tablet Take 0.5 tablets (0.3 mg total) by mouth 2 (two) times daily. (Patient not taking: Reported on 11/29/2014) 60 tablet 0 04/08/2014 at Unknown time  . diphenhydrAMINE (BENADRYL) 12.5 MG/5ML elixir Take 5 mLs (12.5 mg total) by mouth every 6 (six) hours as needed for itching. 120 mL 0 unknown  . metoprolol tartrate (LOPRESSOR) 25 MG tablet Take 1 tablet (25 mg total) by mouth 2 (two) times daily. (Patient not taking: Reported on 11/29/2014) 60 tablet 0 04/08/2014 at [facility] Reviewed  Assessment: Heparin level 0.1 on 650 units/hr.  No problems with heparin infusion or IV site per RN. No bleeding noted. CBC stable thought Hct decreased to 35.2.   Goal of Therapy:  Heparin level 0.3-0.7 units/ml Monitor platelets by anticoagulation protocol: Yes   Plan:  Heparin 2000 units bolus x1  Increase heparin drip to 850 units/hr Heparin level in  8hr Daily heparin level and CBC.   Nicole Cella, RPh Clinical Pharmacist Pager: [phone] 11/30/2014,6:03 AM

## 2014-11-30 NOTE — Progress Notes (Signed)
[facility] for Heparin Indication: chest pain/ACS / afib  No Known Allergies  Patient Measurements: Height: 5\' 4"  (162.6 cm) Weight: 125 lb (56.7 kg) IBW/kg (Calculated) : 54.7 Heparin Dosing Weight: 56.7 kg  Vital Signs: Temp: 98.2 F (36.8 C) (03/24 1825) Temp Source: Oral (03/24 1825) BP: 107/71 mmHg (03/24 1900) Pulse Rate: 65 (03/24 1900)  Labs:  Recent Labs  11/29/14 1645 11/29/14 1716 11/30/14 0422  HGB 13.3  --  12.0  HCT 38.7  --  35.2*  PLT 295  --  263  APTT  --  36  --   LABPROT  --  17.5*  --   INR  --  1.42  --   HEPARINUNFRC  --   --  0.10*  CREATININE  --  1.34*  --   TROPONINI  --  0.18*  --     Estimated Creatinine Clearance: 28.4 mL/min (by C-G formula based on Cr of 1.34).   Assessment: 81 year old female admitted with chest pain and Afib.  Now s/p cath -- for medical therapy.  Heparin to resume at midnight for Afib  Goal of Therapy:  Heparin level 0.3-0.7 units/ml Monitor platelets by anticoagulation protocol: Yes   Plan:  Resume heparin at 950 units / hr at midnight 8 hr heparin level Daily heparin level, CBC  Thank you. Anette Guarneri, PharmD [phone]  11/30/2014,8:25 PM

## 2014-11-30 NOTE — CV Procedure (Signed)
PROCEDURE:  Left heart catheterization with selective coronary angiography, left ventriculogram.  CLINICAL HISTORY:  This is a 81 year old female with multiple risk factors had EKG changes of ischemia and elevated troponin-I.  The risks, benefits, and details of the procedure were explained to the patient.  The patient verbalized understanding and wanted to proceed.  Informed written consent was obtained.  PROCEDURE TECHNIQUE:  The patient was approached from the right femoral artery using a 5 French short sheath.  Left coronary angiography was done using a Judkins L4 guide catheter.  Right coronary angiography was done using a Judkins R4 guide catheter.  Left ventriculography was done using a pigtail catheter.    CONTRAST:  Total of 50 cc.  COMPLICATIONS:  None.  At the end of the procedure a manual pressure device was used for hemostasis.    HEMODYNAMICS:  Aortic pressure was 96/58; LV pressure was 95/17; LVEDP 25.  There was no gradient between the left ventricle and aorta.    ANGIOGRAM/CORONARY ARTERIOGRAM:   The left main coronary artery is short and unremarkable.  The left anterior descending artery has osteal 20 % narrowing and proximal luminal irregularities. Diagonal vessels were small vessels.  The left circumflex artery has osteal 20 % narrowing and mid vessel 70 % concentric lesion before continuing as OM branch which is unremarkable. Ramus branch is very small.  The right coronary artery is dominant, has osteal 70 % eccentric lesion with pressure dampening but with good distal flow. It has large high marginal branch and narrow posterior descending branch.  LEFT VENTRICULOGRAM:  Left ventricular angiogram was not done but echocardiogram done earlier showed severe LVH and moderate to severe systolic dysfunction with 37 % EF. LVEDP was 25 mmHg.  IMPRESSION OF HEART CATHETERIZATION:   1. Normal left main coronary artery. 2. Mild disease of left anterior descending artery and its  branches. 3. Moderate to severe disease of left circumflex artery and unremarkable its branches. 4. Severe disease of right coronary artery. 5. Moderate to severe left ventricular systolic dysfunction.  LVEDP 25 mmHg.  Ejection fraction 37 %.  RECOMMENDATION:   Medical treatment for now. If tolerates aspirin and plavix then consider staged angioplasty.

## 2014-11-30 NOTE — Progress Notes (Signed)
Site area: rt groin Site Prior to Removal:  Level  0 Pressure Applied For:  20 minutes Manual:   yes Patient Status During Pull:  stable Post Pull Site:  Level  0 Post Pull Instructions Given:  yes Post Pull Pulses Present: yes Dressing Applied:  tegaderm Bedrest begins @   1750  Comments:  0 complications

## 2014-11-30 NOTE — Progress Notes (Signed)
Pt has not voided since transfer from [facility]. Pt asked frequently if she needed to void but said she did not. Scanned bladder at 0430; only 144ml in bladder. Will continue to monitor at this time.

## 2014-11-30 NOTE — Interval H&P Note (Signed)
History and Physical Interval Note:  11/30/2014 3:54 PM  [name] [name]  has presented today for surgery, with the diagnosis of cp  The various methods of treatment have been discussed with the patient and family. After consideration of risks, benefits and other options for treatment, the patient has consented to  Procedure(s): LEFT HEART CATHETERIZATION WITH CORONARY ANGIOGRAM (N/A) as a surgical intervention .  The patient's history has been reviewed, patient examined, no change in status, stable for surgery.  I have reviewed the patient's chart and labs.  Questions were answered to the patient's satisfaction.     Kahleb Mcclane S

## 2014-11-30 NOTE — Progress Notes (Signed)
Eating PB crackers.

## 2014-11-30 NOTE — Progress Notes (Signed)
Blood pressure on the 21'F systolic, asymptomatic, md aware, ns 100 ml given , avapro and lopressor not given. Cont. To monitor.

## 2014-11-30 NOTE — Progress Notes (Signed)
Back from the cath lab awake and alert. Right groin dressing dry and intact. Instructed not to bend right knee and to call RN for any bleeding. When coughing nor sneezing instructed to to press on the right groin dressing.  Refused  To eat dinner, crackers with peanut butter and 120 ml orange juice given. Continue to monitor.

## 2014-11-30 NOTE — Progress Notes (Signed)
Called Dr. Doylene Canard about restarting heparin post-cath. Dr. Doylene Canard ordered to restart heparin at 0000 3/25. Called pharmacy and relayed order to them. Will continue to monitor.

## 2014-11-30 NOTE — Care Management Note (Signed)
    Page 1 of 1   11/30/2014     9:05:45 AM CARE MANAGEMENT NOTE 11/30/2014  Patient:  TAUHEEDAH, BOK   Account Number:  [id number]  Date Initiated:  11/30/2014  Documentation initiated by:  Elissa Hefty  Subjective/Objective Assessment:   adm w at fib     Action/Plan:   lives alone pcp dr g kilpatrick   Anticipated DC Date:     Anticipated DC Plan:  [place]         Choice offered to / List presented to:             Status of service:   Medicare Important Message given?   (If response is "NO", the following Medicare IM given date fields will be blank) Date Medicare IM given:   Medicare IM given by:   Date Additional Medicare IM given:   Additional Medicare IM given by:    Discharge Disposition:    Per UR Regulation:  Reviewed for med. necessity/level of care/duration of stay  If discussed at [place] of Stay Meetings, dates discussed:    Comments:

## 2014-12-01 ENCOUNTER — Encounter (HOSPITAL_COMMUNITY): Payer: Self-pay | Admitting: Cardiovascular Disease

## 2014-12-01 LAB — HEPARIN LEVEL (UNFRACTIONATED): HEPARIN UNFRACTIONATED: 0.36 [IU]/mL (ref 0.30–0.70)

## 2014-12-01 LAB — CBC
HCT: 34.7 % — ABNORMAL LOW (ref 36.0–46.0)
Hemoglobin: 11.7 g/dL — ABNORMAL LOW (ref 12.0–15.0)
MCH: 29.3 pg (ref 26.0–34.0)
MCHC: 33.7 g/dL (ref 30.0–36.0)
MCV: 87 fL (ref 78.0–100.0)
Platelets: 238 10*3/uL (ref 150–400)
RBC: 3.99 MIL/uL (ref 3.87–5.11)
RDW: 15.3 % (ref 11.5–15.5)
WBC: 10.3 10*3/uL (ref 4.0–10.5)

## 2014-12-01 LAB — BASIC METABOLIC PANEL
Anion gap: 9 (ref 5–15)
BUN: 43 mg/dL — AB (ref 6–23)
CHLORIDE: 99 mmol/L (ref 96–112)
CO2: 20 mmol/L (ref 19–32)
Calcium: 8.4 mg/dL (ref 8.4–10.5)
Creatinine, Ser: 1.78 mg/dL — ABNORMAL HIGH (ref 0.50–1.10)
GFR calc Af Amer: 30 mL/min — ABNORMAL LOW (ref >90)
GFR, EST NON AFRICAN AMERICAN: 26 mL/min — AB (ref >90)
Glucose, Bld: 127 mg/dL — ABNORMAL HIGH (ref 70–99)
POTASSIUM: 5.2 mmol/L — AB (ref 3.5–5.1)
Sodium: 128 mmol/L — ABNORMAL LOW (ref 135–145)

## 2014-12-01 MED ORDER — APIXABAN 2.5 MG PO TABS
2.5000 mg | ORAL_TABLET | Freq: Two times a day (BID) | ORAL | Status: DC
  Administered 2014-12-01 – 2014-12-03 (×4): 2.5 mg via ORAL
  Filled 2014-12-01 (×5): qty 1

## 2014-12-01 MED ORDER — SODIUM CHLORIDE 0.9 % IV SOLN
INTRAVENOUS | Status: AC
  Administered 2014-12-01: 18:00:00 via INTRAVENOUS

## 2014-12-01 MED ORDER — CETYLPYRIDINIUM CHLORIDE 0.05 % MT LIQD
7.0000 mL | Freq: Two times a day (BID) | OROMUCOSAL | Status: DC
  Administered 2014-12-02 – 2014-12-03 (×3): 7 mL via OROMUCOSAL

## 2014-12-01 MED ORDER — FUROSEMIDE 10 MG/ML IJ SOLN: 40.0000 mg | Freq: Once | INTRAMUSCULAR | Status: DC

## 2014-12-01 MED ORDER — COLCHICINE 0.6 MG PO TABS: 0.6000 mg | ORAL_TABLET | Freq: Two times a day (BID) | ORAL | Status: DC

## 2014-12-01 MED ORDER — ATORVASTATIN CALCIUM 10 MG PO TABS
10.0000 mg | ORAL_TABLET | Freq: Every day | ORAL | Status: DC
  Administered 2014-12-01 – 2014-12-02 (×2): 10 mg via ORAL
  Filled 2014-12-01 (×4): qty 1

## 2014-12-01 MED ORDER — COLCHICINE 0.6 MG PO TABS
0.3000 mg | ORAL_TABLET | Freq: Every day | ORAL | Status: DC
  Administered 2014-12-01 – 2014-12-03 (×3): 0.3 mg via ORAL
  Filled 2014-12-01 (×3): qty 0.5

## 2014-12-01 MED ORDER — DIGOXIN 250 MCG PO TABS
0.2500 mg | ORAL_TABLET | Freq: Every day | ORAL | Status: AC
  Administered 2014-12-01 – 2014-12-02 (×2): 0.25 mg via ORAL
  Filled 2014-12-01 (×2): qty 1

## 2014-12-01 NOTE — Progress Notes (Signed)
[facility] for Heparin Indication: chest pain/ACS / afib  No Known Allergies  Patient Measurements: Height: 5\' 4"  (162.6 cm) Weight: 125 lb (56.7 kg) IBW/kg (Calculated) : 54.7 Heparin Dosing Weight: 56.7 kg  Vital Signs: Temp: 96.9 F (36.1 C) (03/25 1137) Temp Source: Oral (03/25 1137) BP: 89/49 mmHg (03/25 1200) Pulse Rate: 68 (03/25 1137)  Labs:  Recent Labs  11/29/14 1645 11/29/14 1716 11/30/14 0422 12/01/14 0428 12/01/14 0910  HGB 13.3  --  12.0  --   --   HCT 38.7  --  35.2*  --   --   PLT 295  --  263  --   --   APTT  --  36  --   --   --   LABPROT  --  17.5*  --   --   --   INR  --  1.42  --   --   --   HEPARINUNFRC  --   --  0.10*  --  0.36  CREATININE  --  1.34*  --  1.78*  --   TROPONINI  --  0.18*  --   --   --     Estimated Creatinine Clearance: 21.4 mL/min (by C-G formula based on Cr of 1.78).   Assessment: 81 year old female admitted with chest pain and Afib.  Now s/p cath -- for medical therapy.    Heparin resumed last night post cath. Heparin level this am was within therapeutic goal at 0.36. No bleeding noted, cbc ip.  Goal of Therapy:  Heparin level 0.3-0.7 units/ml Monitor platelets by anticoagulation protocol: Yes   Plan:  Continue heparin at 950 units / hr  Daily heparin level, CBC Will follow for plan  Erin Hearing PharmD., BCPS Clinical Pharmacist Pager [phone] 12/01/2014 12:42 PM

## 2014-12-01 NOTE — Progress Notes (Signed)
Patient BP 98/70 with a HR of 90. Called Dr. Doylene Canard. Ordered to hold metoprolol tonight, to give pm dose of cardizem, and to give digoxin. Will continue to monitor.

## 2014-12-01 NOTE — Progress Notes (Signed)
Ref: Leola Brazil, MD   Subjective:  Discussed care with Son, Sonia Side and other family members. They are aware of critical blockages of coronary arteries and weak heart. I also told them Dr. Harwani/Cardiology is willing to put stents. Patient and family declines interventions. Afebrile. Blood pressure 80/40 to 90/50.  Objective:  Vital Signs in the last 24 hours: Temp:  [96.9 F (36.1 C)-98.2 F (36.8 C)] 97.4 F (36.3 C) (03/25 1652) Pulse Rate:  [52-104] 86 (03/25 1652) Cardiac Rhythm:  [-] Atrial fibrillation (03/25 0800) Resp:  [13-24] 22 (03/25 1652) BP: (80-112)/(40-76) 96/52 mmHg (03/25 1652) SpO2:  [77 %-100 %] 99 % (03/25 1652)  Physical Exam: BP Readings from Last 1 Encounters:  12/01/14 96/52    Wt Readings from Last 1 Encounters:  11/29/14 56.7 kg (125 lb)    Weight change:   HEENT: [place]/AT, Eyes-Brown, PERL, EOMI, Conjunctiva-Pink, Sclera-Non-icteric Neck: No JVD, No bruit, Trachea midline. Lungs:  Clear, Bilateral. Cardiac:  Regular rhythm, normal S1 and S2, no S3.  Abdomen:  Soft, non-tender. Extremities:  No edema present. No cyanosis. No clubbing. Left hand pain and swelling persist. CNS: AxOx2, Cranial nerves grossly intact, moves all 4 extremities. Right handed. Skin: Warm and dry.   Intake/Output from previous day: 03/24 0701 - 03/25 0700 In: 670.4 [P.O.:130; I.V.:540.4] Out: -     Lab Results: BMET    Component Value Date/Time   NA 128* 12/01/2014 0428   NA 130* 11/29/2014 1716   NA 141 04/08/2014 2256   K 5.2* 12/01/2014 0428   K 3.5 11/29/2014 1716   K 3.2* 04/08/2014 2256   CL 99 12/01/2014 0428   CL 97 11/29/2014 1716   CL 99 04/08/2014 2256   CO2 20 12/01/2014 0428   CO2 21 11/29/2014 1716   CO2 26 04/08/2014 2256   GLUCOSE 127* 12/01/2014 0428   GLUCOSE 136* 11/29/2014 1716   GLUCOSE 264* 04/08/2014 2256   BUN 43* 12/01/2014 0428   BUN 34* 11/29/2014 1716   BUN 24* 04/08/2014 2256   CREATININE 1.78* 12/01/2014 0428   CREATININE 1.34* 11/29/2014 1716   CREATININE 1.33* 04/08/2014 2256   CALCIUM 8.4 12/01/2014 0428   CALCIUM 8.9 11/29/2014 1716   CALCIUM 9.6 04/08/2014 2256   GFRNONAA 26* 12/01/2014 0428   GFRNONAA 36* 11/29/2014 1716   GFRNONAA 36* 04/08/2014 2256   GFRAA 30* 12/01/2014 0428   GFRAA 42* 11/29/2014 1716   GFRAA 42* 04/08/2014 2256   CBC    Component Value Date/Time   WBC 10.3 12/01/2014 1536   RBC 3.99 12/01/2014 1536   HGB 11.7* 12/01/2014 1536   HCT 34.7* 12/01/2014 1536   PLT 238 12/01/2014 1536   MCV 87.0 12/01/2014 1536   MCH 29.3 12/01/2014 1536   MCHC 33.7 12/01/2014 1536   RDW 15.3 12/01/2014 1536   LYMPHSABS 0.9 11/29/2014 1645   MONOABS 1.4* 11/29/2014 1645   EOSABS 0.0 11/29/2014 1645   BASOSABS 0.0 11/29/2014 1645   HEPATIC Function Panel  Recent Labs  03/16/14 1708 03/18/14 0632  PROT 7.1 6.7   HEMOGLOBIN A1C No components found for: HGA1C,  MPG CARDIAC ENZYMES Lab Results  Component Value Date   CKTOTAL 381* 01/20/2012   CKMB 9.1* 01/20/2012   TROPONINI 0.18* 11/29/2014   TROPONINI <0.30 03/17/2014   TROPONINI <0.30 03/17/2014   BNP  Recent Labs  03/16/14 1708  PROBNP 6646.0*   TSH  Recent Labs  03/16/14 2305  TSH 6.570*   CHOLESTEROL No results for input(s):  CHOL in the last 8760 hours.  Scheduled Meds: . apixaban  2.5 mg Oral BID  . atorvastatin  10 mg Oral q1800  . colchicine  0.6 mg Oral BID  . cycloSPORINE  1 drop Both Eyes BID  . digoxin  0.25 mg Oral Daily  . diltiazem  30 mg Oral 4 times per day  . donepezil  5 mg Oral Daily  . glimepiride  2 mg Oral Daily  . lactulose  10 g Oral BID  . metoprolol succinate  25 mg Oral BID  . oxybutynin  10 mg Oral Daily  . pantoprazole  40 mg Oral BID  . sodium chloride  3 mL Intravenous Q12H   Continuous Infusions: . sodium chloride     PRN Meds:.sodium chloride, diphenhydrAMINE, sodium chloride, traMADol  Assessment/Plan: Atrial fibrillation with rapid ventricular  response-improving rate. DM, II Hypertension CAD-multivessel, native vessel CKD, II Hypothyroidism  Hold diltiazem for low blood pressure. Try lanoxin for rate control Low dose apixaben. In and out cath. 500 cc fluid bolus over 5 hours. Colchicine for gout in left hand.     LOS: 2 days    Dixie Dials  MD  12/01/2014, 5:28 PM     Discussed care with son

## 2014-12-01 NOTE — Progress Notes (Signed)
Patient complaining of on-going pain and swelling in left hand related to gout flare up. Emotional support given and extremity elevated on pillow. Dr. Doylene Canard notified, and new orders for colchicine given. Will continue to monitor.

## 2014-12-01 NOTE — Progress Notes (Addendum)
Pt's BP has been low throughout the day with SBP in the 80's-90's, urine output has been marginal. Dr. Doylene Canard made aware, and at bedside. New orders to hold PO diltiazem and for 500 cc fluid bolus over 5 hours. In and out cath performed, with 600cc's of dark/amber urine out. Dr. Doylene Canard made aware, will continue to monitor closely.

## 2014-12-02 LAB — BASIC METABOLIC PANEL
Anion gap: 14 (ref 5–15)
BUN: 44 mg/dL — ABNORMAL HIGH (ref 6–23)
CO2: 18 mmol/L — ABNORMAL LOW (ref 19–32)
Calcium: 8.7 mg/dL (ref 8.4–10.5)
Chloride: 97 mmol/L (ref 96–112)
Creatinine, Ser: 1.7 mg/dL — ABNORMAL HIGH (ref 0.50–1.10)
GFR calc Af Amer: 31 mL/min — ABNORMAL LOW (ref >90)
GFR calc non Af Amer: 27 mL/min — ABNORMAL LOW (ref >90)
Glucose, Bld: 34 mg/dL — CL (ref 70–99)
POTASSIUM: 4.5 mmol/L (ref 3.5–5.1)
SODIUM: 129 mmol/L — AB (ref 135–145)

## 2014-12-02 LAB — GLUCOSE, CAPILLARY
GLUCOSE-CAPILLARY: 47 mg/dL — AB (ref 70–99)
GLUCOSE-CAPILLARY: 159 mg/dL — AB (ref 70–99)
GLUCOSE-CAPILLARY: 120 mg/dL — AB (ref 70–99)
Glucose-Capillary: 100 mg/dL — ABNORMAL HIGH (ref 70–99)
Glucose-Capillary: 89 mg/dL (ref 70–99)

## 2014-12-02 MED ORDER — FUROSEMIDE 10 MG/ML IJ SOLN
40.0000 mg | Freq: Once | INTRAMUSCULAR | Status: AC
  Administered 2014-12-02: 40 mg via INTRAVENOUS
  Filled 2014-12-02: qty 4

## 2014-12-02 MED ORDER — DILTIAZEM HCL 30 MG PO TABS
30.0000 mg | ORAL_TABLET | Freq: Two times a day (BID) | ORAL | Status: DC
  Administered 2014-12-02 – 2014-12-03 (×3): 30 mg via ORAL
  Filled 2014-12-02 (×4): qty 1

## 2014-12-02 MED ORDER — DEXTROSE 50 % IV SOLN
INTRAVENOUS | Status: AC
  Administered 2014-12-02: 50 mL via INTRAVENOUS
  Filled 2014-12-02: qty 50

## 2014-12-02 MED ORDER — DEXTROSE 50 % IV SOLN
1.0000 | Freq: Once | INTRAVENOUS | Status: AC
  Administered 2014-12-02: 50 mL via INTRAVENOUS

## 2014-12-02 NOTE — Progress Notes (Signed)
Pt unable to urinate after two attempts this afternoon sitting on bedside commode; pt c no complaints; states she does not feel the urge; MD aware and orders received to give newly ordered Lasix and in and out cath if pt has not urinated in one hour following lasix administration

## 2014-12-02 NOTE — Progress Notes (Signed)
Lab notified a critical glucose level of 34. CBG rechecked and it was 47. Patient asymptomatic. Gave 1 amp glucose, CBG 159. Notified Dr. Doylene Canard. Will monitor CBG ACHS.

## 2014-12-02 NOTE — Progress Notes (Signed)
Ref: [name],[name], MD   Subjective:  Awake. Had asymptomatic hypoglycemia last night treated and improved with D50 injection. Heart rate down to 60's and blood pressure improved to 107/60. Afebrile.  Objective:  Vital Signs in the last 24 hours: Temp:  [96.9 F (36.1 C)-97.8 F (36.6 C)] 97.8 F (36.6 C) (03/26 0755) Pulse Rate:  [57-111] 69 (03/26 0755) Cardiac Rhythm:  [-] Atrial fibrillation (03/26 0400) Resp:  [14-34] 17 (03/26 0755) BP: (87-133)/(49-82) 102/56 mmHg (03/26 0755) SpO2:  [90 %-100 %] 95 % (03/26 0755)  Physical Exam: BP Readings from Last 1 Encounters:  12/02/14 102/56    Wt Readings from Last 1 Encounters:  11/29/14 56.7 kg (125 lb)    Weight change:   HEENT: [place]/AT, Eyes-Brown, PERL, EOMI, Conjunctiva-Pink, Sclera-Non-icteric Neck: No JVD, No bruit, Trachea midline. Lungs:  Clear, Bilateral. Cardiac:  Regular rhythm, normal S1 and S2, no S3.  Abdomen:  Soft, non-tender. Extremities:  No edema present. No cyanosis. No clubbing. Left hand swelling down by 50 % and tenderness persist. CNS: AxOx2, Cranial nerves grossly intact, moves all 4 extremities. Right handed. Skin: Warm and dry.   Intake/Output from previous day: 03/25 0701 - 03/26 0700 In: 1075.8 [P.O.:360; I.V.:715.8] Out: 1300 [Urine:1300]    Lab Results: BMET    Component Value Date/Time   NA 129* 12/02/2014 0224   NA 128* 12/01/2014 0428   NA 130* 11/29/2014 1716   K 4.5 12/02/2014 0224   K 5.2* 12/01/2014 0428   K 3.5 11/29/2014 1716   CL 97 12/02/2014 0224   CL 99 12/01/2014 0428   CL 97 11/29/2014 1716   CO2 18* 12/02/2014 0224   CO2 20 12/01/2014 0428   CO2 21 11/29/2014 1716   GLUCOSE 34* 12/02/2014 0224   GLUCOSE 127* 12/01/2014 0428   GLUCOSE 136* 11/29/2014 1716   BUN 44* 12/02/2014 0224   BUN 43* 12/01/2014 0428   BUN 34* 11/29/2014 1716   CREATININE 1.70* 12/02/2014 0224   CREATININE 1.78* 12/01/2014 0428   CREATININE 1.34* 11/29/2014 1716   CALCIUM 8.7  12/02/2014 0224   CALCIUM 8.4 12/01/2014 0428   CALCIUM 8.9 11/29/2014 1716   GFRNONAA 27* 12/02/2014 0224   GFRNONAA 26* 12/01/2014 0428   GFRNONAA 36* 11/29/2014 1716   GFRAA 31* 12/02/2014 0224   GFRAA 30* 12/01/2014 0428   GFRAA 42* 11/29/2014 1716   CBC    Component Value Date/Time   WBC 10.3 12/01/2014 1536   RBC 3.99 12/01/2014 1536   HGB 11.7* 12/01/2014 1536   HCT 34.7* 12/01/2014 1536   PLT 238 12/01/2014 1536   MCV 87.0 12/01/2014 1536   MCH 29.3 12/01/2014 1536   MCHC 33.7 12/01/2014 1536   RDW 15.3 12/01/2014 1536   LYMPHSABS 0.9 11/29/2014 1645   MONOABS 1.4* 11/29/2014 1645   EOSABS 0.0 11/29/2014 1645   BASOSABS 0.0 11/29/2014 1645   HEPATIC Function Panel  Recent Labs  03/16/14 1708 03/18/14 0632  PROT 7.1 6.7   HEMOGLOBIN A1C No components found for: HGA1C,  MPG CARDIAC ENZYMES Lab Results  Component Value Date   CKTOTAL 381* 01/20/2012   CKMB 9.1* 01/20/2012   TROPONINI 0.18* 11/29/2014   TROPONINI <0.30 03/17/2014   TROPONINI <0.30 03/17/2014   BNP  Recent Labs  03/16/14 1708  PROBNP 6646.0*   TSH  Recent Labs  03/16/14 2305  TSH 6.570*   CHOLESTEROL No results for input(s): CHOL in the last 8760 hours.  Scheduled Meds: . antiseptic oral rinse  7 mL Mouth Rinse BID  . apixaban  2.5 mg Oral BID  . atorvastatin  10 mg Oral q1800  . colchicine  0.3 mg Oral Daily  . cycloSPORINE  1 drop Both Eyes BID  . digoxin  0.25 mg Oral Daily  . diltiazem  30 mg Oral Q12H  . donepezil  5 mg Oral Daily  . lactulose  10 g Oral BID  . metoprolol succinate  25 mg Oral BID  . oxybutynin  10 mg Oral Daily  . pantoprazole  40 mg Oral BID  . sodium chloride  3 mL Intravenous Q12H   Continuous Infusions:  PRN Meds:.sodium chloride, diphenhydrAMINE, sodium chloride, traMADol  Assessment/Plan: Atrial fibrillation with rapid ventricular response-improving rate. Unstable angina DM, II Hypertension CAD-multivessel, native vessel CKD,  II Hypothyroidism Amaryl adverse effect  Discontinue Amaryl. Decrease diltiazem dose by 50 %. PT evaluation.     LOS: 3 days    Dixie Dials  MD  12/02/2014, 9:49 AM

## 2014-12-03 ENCOUNTER — Inpatient Hospital Stay (HOSPITAL_COMMUNITY): Payer: Medicare Other

## 2014-12-03 LAB — BASIC METABOLIC PANEL
ANION GAP: 8 (ref 5–15)
BUN: 37 mg/dL — ABNORMAL HIGH (ref 6–23)
CALCIUM: 8.5 mg/dL (ref 8.4–10.5)
CO2: 21 mmol/L (ref 19–32)
CREATININE: 1.54 mg/dL — AB (ref 0.50–1.10)
Chloride: 99 mmol/L (ref 96–112)
GFR, EST AFRICAN AMERICAN: 35 mL/min — AB (ref >90)
GFR, EST NON AFRICAN AMERICAN: 30 mL/min — AB (ref >90)
Glucose, Bld: 77 mg/dL (ref 70–99)
Potassium: 4 mmol/L (ref 3.5–5.1)
SODIUM: 128 mmol/L — AB (ref 135–145)

## 2014-12-03 LAB — GLUCOSE, CAPILLARY
Glucose-Capillary: 59 mg/dL — ABNORMAL LOW (ref 70–99)
Glucose-Capillary: 68 mg/dL — ABNORMAL LOW (ref 70–99)
Glucose-Capillary: 86 mg/dL (ref 70–99)
Glucose-Capillary: 111 mg/dL — ABNORMAL HIGH (ref 70–99)

## 2014-12-03 MED ORDER — DIGOXIN 125 MCG PO TABS: 0.0625 mg | ORAL_TABLET | Freq: Every day | ORAL | Status: AC

## 2014-12-03 MED ORDER — LEVOTHYROXINE SODIUM 25 MCG PO TABS
25.0000 ug | ORAL_TABLET | Freq: Every day | ORAL | Status: DC
  Filled 2014-12-03 (×2): qty 1

## 2014-12-03 MED ORDER — APIXABAN 2.5 MG PO TABS: 2.5000 mg | ORAL_TABLET | Freq: Two times a day (BID) | ORAL | Status: AC

## 2014-12-03 MED ORDER — COLCHICINE 0.6 MG PO TABS: 0.3000 mg | ORAL_TABLET | Freq: Every day | ORAL | Status: DC

## 2014-12-03 MED ORDER — LEVOTHYROXINE SODIUM 25 MCG PO TABS: 25.0000 ug | ORAL_TABLET | Freq: Every day | ORAL | Status: DC

## 2014-12-03 MED ORDER — DILTIAZEM HCL 30 MG PO TABS: 30.0000 mg | ORAL_TABLET | Freq: Two times a day (BID) | ORAL | Status: AC

## 2014-12-03 MED ORDER — METOPROLOL SUCCINATE ER 25 MG PO TB24: 25.0000 mg | ORAL_TABLET | Freq: Every day | ORAL | Status: DC

## 2014-12-03 NOTE — Evaluation (Signed)
Physical Therapy Evaluation Patient Details Name: [name] [name] MRN: [id number] DOB: [birth year] Today's Date: 12/03/2014   History of Present Illness  81 y.o. female with PMHx of DM, HTN, CAD, diastolic dysfunction, renal insufficiency, and gout who presents to the Emergency Department complaining of left hand pain with first onset 2 months ago, worsening today. Pt also reports 8/10 burning chest pain, Patient s/p cardiac cathiterization.   Clinical Impression  Patient demonstrates deficits in functional mobility as indicated below. Will need continued skilled PT to address deficits and maximize function. Will see as indicated and progress as tolerated. OF NOTE: VSS throughout session, BP 120/61 prior to activity    Follow Up Recommendations Home health PT;Supervision for mobility/OOB    Equipment Recommendations  None recommended by PT    Recommendations for Other Services       Precautions / Restrictions Precautions Precautions: Fall Restrictions Weight Bearing Restrictions: No      Mobility  Bed Mobility               General bed mobility comments: received up in chair  Transfers Overall transfer level: Needs assistance Equipment used: 1 person hand held assist Transfers: Sit to/from Stand Sit to Stand: Min assist         General transfer comment: patient self limiting WBing through LUE, reliance on RUE and assist for stability to elevate to standing. increased time to perform.  Ambulation/Gait Ambulation/Gait assistance: Min guard;Min assist Ambulation Distance (Feet): 40 Feet Assistive device: 1 person hand held assist Gait Pattern/deviations: Step-through pattern;Decreased stride length;Drifts right/left;Narrow base of support Gait velocity: decreased Gait velocity interpretation: Below normal speed for age/gender    Stairs            Wheelchair Mobility    Modified Rankin (Stroke Patients Only)       Balance Overall balance assessment:  Needs assistance Sitting-balance support: Feet supported Sitting balance-Leahy Scale: Fair     Standing balance support: No upper extremity supported;During functional activity Standing balance-Leahy Scale: Fair Standing balance comment: min guard for stability while performing various self care tasks at counter                              Pertinent Vitals/Pain Pain Assessment: Faces Faces Pain Scale: Hurts even more Pain Location: left UE arm/hand Pain Descriptors / Indicators: Discomfort Pain Intervention(s): Limited activity within patient's tolerance;Monitored during session;Repositioned;Relaxation    Home Living Family/patient expects to be discharged to:: Private residence Living Arrangements: Children Available Help at Discharge: Family;Available PRN/intermittently (Son (POA) lives nearby and assists PRN) Type of Home: Apartment (Handicap apartment) Home Access: Level entry     Home Layout: One level Home Equipment: West - 2 wheels;Toilet riser;Grab bars - toilet;Grab bars - tub/shower Additional Comments: Tub shower    Prior Function Level of Independence: Independent with assistive device(s);Needs assistance         Comments: son and daughter in law assist with some self care tasks     Hand Dominance   Dominant Hand: Right    Extremity/Trunk Assessment   Upper Extremity Assessment: LUE deficits/detail           Lower Extremity Assessment: Generalized weakness      Cervical / Trunk Assessment: Kyphotic  Communication   Communication: HOH  Cognition Arousal/Alertness: Awake/alert Behavior During Therapy: WFL for tasks assessed/performed Overall Cognitive Status: Within Functional Limits for tasks assessed  General Comments      Exercises        Assessment/Plan    PT Assessment Patient needs continued PT services  PT Diagnosis Difficulty walking;Generalized weakness;Acute pain   PT  Problem List Decreased strength;Decreased activity tolerance;Decreased balance;Decreased mobility;Cardiopulmonary status limiting activity  PT Treatment Interventions DME instruction;Gait training;Functional mobility training;Therapeutic activities;Therapeutic exercise;Balance training;Patient/family education   PT Goals (Current goals can be found in the Care Plan section) Acute Rehab PT Goals Patient Stated Goal: to go home PT Goal Formulation: With patient Time For Goal Achievement: 12/17/14 Potential to Achieve Goals: Good    Frequency Min 3X/week   Barriers to discharge        Co-evaluation               End of Session Equipment Utilized During Treatment: Gait belt Activity Tolerance: Patient tolerated treatment well Patient left: in chair;with call bell/phone within reach Nurse Communication: Mobility status         Time: 9311-2162 PT Time Calculation (min) (ACUTE ONLY): 13 min   Charges:   PT Evaluation $Initial PT Evaluation Tier I: 1 Procedure     PT G CodesDuncan Dull December 30, 2014, 9:47 AM  Alben Deeds, PT DPT  [phone]

## 2014-12-03 NOTE — Progress Notes (Signed)
CBG 0820 was 59; gave pt OJ and rechecked 0840 for CBG 68. Pt finished breakfast and rechecked at 0905 for CBG 86.

## 2014-12-03 NOTE — Discharge Summary (Addendum)
Physician Discharge Summary  Patient ID: [name] [name] MRN: [id number] DOB/AGE: [birth year] 81 y.o.  Admit date: 11/29/2014 Discharge date: 12/03/2014  Admission Diagnoses: Atrial fibrillation with rapid ventricular response-improving rate. Unstable angina DM, II Hypertension CAD-multivessel, native vessel CKD, II Hypothyroidism  Discharge Diagnoses:  Principle Problem: * Atrial fibrillation with rapid ventricular response * Unstable angina DM, II, diet controlled Hypertension CAD-multivessel, native vessel CKD, II Hypothyroidism Amaryl adverse effect Hyponatremia Left hand gout  Discharged Condition: fair  Hospital Course: 81 y.o. female with PMHx of DM, HTN, CAD, diastolic dysfunction, renal insufficiency, and gout who presented to the Emergency Department complaining of left hand pain with first onset 2 months ago, worsening today. Pt also reports 8/10 burning chest pain with onset yesterday. She states it feels "like a toothache." Pt noted associated SOB last night which has resolved. Pt notes lying down makes the pain feel better. Pt denies nausea and diaphoresis. She also had atrial fibrillation with rapid ventricular response improving with diltiazem drip. She underwent cardiac catheterization showing severe 2 vessel disease. Patient and family declined angioplasty for now. She had hypoglycemic episode imroving with D50 and discontinuation of glimepiride. Her hear rate and blood pressure improved with lanoxin dose. She was able to walk in the room. She declined home health nurse and PT. Her left hand swelling improving with 0.3 mg. of colchicine daily. She was discharged home with follow up in 1 month by me in 2 weeks by primary care Dr. Vincente Liberty.  Consults: cardiology  Significant Diagnostic Studies: labs: Elevated WBC count of 21.1 K improved to 10.3 on day of discharge and borderline Hemoglobin level of 11.7. Hyponatremia with sodium level of 128 to 130 meq. Elevated  BUN/Cr to 34/1.34 to 44/1.70. Minimally elevated troponin-I   EKG-atrial fibrillation with variable AV block. Low voltage and old inferior infarct.  Echocardiogram: - Left ventricle: The cavity size was normal. There was severe concentric hypertrophy. Systolic function was moderately reduced. The estimated ejection fraction was in the range of 35% to 40%. Diffuse hypokinesis. - Aortic valve: There was mild regurgitation directed centrally in the LVOT. - Mitral valve: Calcified annulus. There was mild regurgitation. - Left atrium: The atrium was moderately to severely dilated. - Right ventricle: The cavity size was mildly dilated. Wall thickness was normal. Systolic function was moderately reduced. - Right atrium: The atrium was mildly dilated. - Pulmonary arteries: Systolic pressure was moderately increased. PA peak pressure: 54 mm Hg (S).  Left heart catheterization: 1. Normal left main coronary artery. 2. Mild disease of left anterior descending artery and its branches. 3. Moderate to severe disease of left circumflex artery and unremarkable its branches. 4. Severe disease of right coronary artery. 5. Moderate to severe left ventricular systolic dysfunction. LVEDP 25 mmHg. Ejection fraction 37 %.  CHADS2- score 3/6 and CHADVASC- score 5/9 and on Apixaban (Eliquis)  Treatments: cardiac meds: diltiazem, digoxin, Simvastatin and Eliquis. Also colchicine and tramadol for pain.  Discharge Exam: Blood pressure 120/61, pulse 73, temperature 97.6 F (36.4 C), temperature source Oral, resp. rate 17, height 5\' 4"  (1.626 m), weight 56.7 kg (125 lb), SpO2 95 %. HEENT: [place]/AT, Eyes-Brown, PERL, EOMI, Conjunctiva-Pink, Sclera-Non-icteric Neck: No JVD, No bruit, Trachea midline. Lungs: Clear, Bilateral. Cardiac: Regular rhythm, normal S1 and S2, no S3.  Abdomen: Soft, non-tender. Extremities: No edema present. No cyanosis. No clubbing. Left hand pain and swelling 50 % improved. CNS: AxOx2,  Cranial nerves grossly intact, moves all 4 extremities. Right handed. Skin: Warm and dry.  Disposition:  01-Home or Self Care     Medication List    STOP taking these medications        amLODipine 5 MG tablet  Commonly known as:  NORVASC     clopidogrel 75 MG tablet  Commonly known as:  PLAVIX     furosemide 40 MG tablet  Commonly known as:  LASIX     glimepiride 2 MG tablet  Commonly known as:  AMARYL     metoprolol tartrate 25 MG tablet  Commonly known as:  LOPRESSOR     potassium chloride 10 MEQ CR capsule  Commonly known as:  MICRO-K     valsartan 80 MG tablet  Commonly known as:  DIOVAN      TAKE these medications        apixaban 2.5 MG Tabs tablet  Commonly known as:  ELIQUIS  Take 1 tablet (2.5 mg total) by mouth 2 (two) times daily.     colchicine 0.6 MG tablet  Take 0.5 tablets (0.3 mg total) by mouth daily.     cycloSPORINE 0.05 % ophthalmic emulsion  Commonly known as:  RESTASIS  Place 1 drop into both eyes 2 (two) times daily.     digoxin 0.125 MG tablet  Commonly known as:  LANOXIN  Take 0.5 tablets (0.0625 mg total) by mouth daily.     diltiazem 30 MG tablet  Commonly known as:  CARDIZEM  Take 1 tablet (30 mg total) by mouth every 12 (twelve) hours.     diphenhydrAMINE 12.5 MG/5ML elixir  Commonly known as:  BENADRYL  Take 5 mLs (12.5 mg total) by mouth every 6 (six) hours as needed for itching.     donepezil 5 MG tablet  Commonly known as:  ARICEPT  Take 5 mg by mouth daily.     lactulose 10 GM/15ML solution  Commonly known as:  CHRONULAC  Take 10 g by mouth 2 (two) times daily.     metoprolol succinate 25 MG 24 hr tablet  Commonly known as:  TOPROL-XL  Take 1 tablet (25 mg total) by mouth daily.     oxybutynin 10 MG 24 hr tablet  Commonly known as:  DITROPAN-XL  Take 10 mg by mouth daily.     pantoprazole 40 MG tablet  Commonly known as:  PROTONIX  Take 40 mg by mouth 2 (two) times daily.     simvastatin 20 MG tablet   Commonly known as:  ZOCOR  Take 20 mg by mouth every evening.     temazepam 15 MG capsule  Commonly known as:  RESTORIL  Take 15 mg by mouth at bedtime as needed. Sleep     traMADol 50 MG tablet  Commonly known as:  ULTRAM  Take 50 mg by mouth 3 (three) times daily as needed. pain     Vitamin D (Ergocalciferol) 50000 UNITS Caps capsule  Commonly known as:  DRISDOL  Take 50,000 Units by mouth every 7 (seven) days.           Follow-up Information    Follow up with [facility] JR,[name], MD In 2 weeks.   Specialty:  Pulmonary Disease   Contact information:   [place] [address] [phone]       Follow up with [facility], MD In 1 month.   Specialty:  Cardiology   Contact information:   [place] [address] [phone]       Signed: Birdie Riddle 12/03/2014, 11:39 AM

## 2015-02-07 DIAGNOSIS — N182 Chronic kidney disease, stage 2 (mild): Secondary | ICD-10-CM

## 2015-02-07 HISTORY — DX: Chronic kidney disease, stage 2 (mild): N18.2

## 2015-03-05 ENCOUNTER — Inpatient Hospital Stay (HOSPITAL_COMMUNITY)
Admission: EM | Admit: 2015-03-05 | Discharge: 2015-03-11 | DRG: 292 | Disposition: A | Discharge status: Home / Self Care | Payer: Medicare Other | Attending: Cardiovascular Disease | Admitting: Cardiovascular Disease

## 2015-03-05 ENCOUNTER — Emergency Department (HOSPITAL_COMMUNITY): Payer: Medicare Other

## 2015-03-05 ENCOUNTER — Encounter (HOSPITAL_COMMUNITY): Payer: Self-pay | Admitting: *Deleted

## 2015-03-05 ENCOUNTER — Inpatient Hospital Stay (HOSPITAL_COMMUNITY): Payer: Medicare Other

## 2015-03-05 DIAGNOSIS — M109 Gout, unspecified: Secondary | ICD-10-CM | POA: Diagnosis present

## 2015-03-05 DIAGNOSIS — I5023 Acute on chronic systolic (congestive) heart failure: Principal | ICD-10-CM | POA: Diagnosis present

## 2015-03-05 DIAGNOSIS — E039 Hypothyroidism, unspecified: Secondary | ICD-10-CM | POA: Diagnosis present

## 2015-03-05 DIAGNOSIS — N390 Urinary tract infection, site not specified: Secondary | ICD-10-CM | POA: Diagnosis present

## 2015-03-05 DIAGNOSIS — Z79899 Other long term (current) drug therapy: Secondary | ICD-10-CM

## 2015-03-05 DIAGNOSIS — I701 Atherosclerosis of renal artery: Secondary | ICD-10-CM | POA: Diagnosis not present

## 2015-03-05 DIAGNOSIS — I2510 Atherosclerotic heart disease of native coronary artery without angina pectoris: Secondary | ICD-10-CM | POA: Diagnosis present

## 2015-03-05 DIAGNOSIS — I4891 Unspecified atrial fibrillation: Secondary | ICD-10-CM | POA: Diagnosis present

## 2015-03-05 DIAGNOSIS — R680 Hypothermia, not associated with low environmental temperature: Secondary | ICD-10-CM | POA: Diagnosis not present

## 2015-03-05 DIAGNOSIS — D649 Anemia, unspecified: Secondary | ICD-10-CM | POA: Diagnosis not present

## 2015-03-05 DIAGNOSIS — I129 Hypertensive chronic kidney disease with stage 1 through stage 4 chronic kidney disease, or unspecified chronic kidney disease: Secondary | ICD-10-CM | POA: Diagnosis not present

## 2015-03-05 DIAGNOSIS — Z7901 Long term (current) use of anticoagulants: Secondary | ICD-10-CM

## 2015-03-05 DIAGNOSIS — E162 Hypoglycemia, unspecified: Secondary | ICD-10-CM

## 2015-03-05 DIAGNOSIS — E11649 Type 2 diabetes mellitus with hypoglycemia without coma: Secondary | ICD-10-CM | POA: Diagnosis not present

## 2015-03-05 DIAGNOSIS — R109 Unspecified abdominal pain: Secondary | ICD-10-CM

## 2015-03-05 DIAGNOSIS — E876 Hypokalemia: Secondary | ICD-10-CM | POA: Diagnosis not present

## 2015-03-05 DIAGNOSIS — I249 Acute ischemic heart disease, unspecified: Secondary | ICD-10-CM | POA: Diagnosis present

## 2015-03-05 DIAGNOSIS — I482 Chronic atrial fibrillation, unspecified: Secondary | ICD-10-CM

## 2015-03-05 DIAGNOSIS — K219 Gastro-esophageal reflux disease without esophagitis: Secondary | ICD-10-CM | POA: Diagnosis not present

## 2015-03-05 DIAGNOSIS — N182 Chronic kidney disease, stage 2 (mild): Secondary | ICD-10-CM | POA: Diagnosis present

## 2015-03-05 DIAGNOSIS — R079 Chest pain, unspecified: Secondary | ICD-10-CM | POA: Diagnosis present

## 2015-03-05 DIAGNOSIS — N184 Chronic kidney disease, stage 4 (severe): Secondary | ICD-10-CM

## 2015-03-05 DIAGNOSIS — R17 Unspecified jaundice: Secondary | ICD-10-CM

## 2015-03-05 DIAGNOSIS — R7989 Other specified abnormal findings of blood chemistry: Secondary | ICD-10-CM

## 2015-03-05 LAB — COMPREHENSIVE METABOLIC PANEL
ALBUMIN: 2.9 g/dL — AB (ref 3.5–5.0)
ALT: 24 U/L (ref 14–54)
ANION GAP: 15 (ref 5–15)
AST: 27 U/L (ref 15–41)
Alkaline Phosphatase: 97 U/L (ref 38–126)
BUN: 42 mg/dL — AB (ref 6–20)
CALCIUM: 8.7 mg/dL — AB (ref 8.9–10.3)
CO2: 19 mmol/L — ABNORMAL LOW (ref 22–32)
CREATININE: 1.9 mg/dL — AB (ref 0.44–1.00)
Chloride: 103 mmol/L (ref 101–111)
GFR calc Af Amer: 27 mL/min — ABNORMAL LOW (ref >60)
GFR calc non Af Amer: 23 mL/min — ABNORMAL LOW (ref >60)
Glucose, Bld: 63 mg/dL — ABNORMAL LOW (ref 65–99)
Potassium: 3.9 mmol/L (ref 3.5–5.1)
Sodium: 137 mmol/L (ref 135–145)
Total Bilirubin: 2.5 mg/dL — ABNORMAL HIGH (ref 0.3–1.2)
Total Protein: 6.1 g/dL — ABNORMAL LOW (ref 6.5–8.1)

## 2015-03-05 LAB — CBC WITH DIFFERENTIAL/PLATELET
Basophils Absolute: 0 10*3/uL (ref 0.0–0.1)
Basophils Relative: 0 % (ref 0–1)
EOS ABS: 0.1 10*3/uL (ref 0.0–0.7)
EOS PCT: 1 % (ref 0–5)
HCT: 40.9 % (ref 36.0–46.0)
Hemoglobin: 14.1 g/dL (ref 12.0–15.0)
LYMPHS ABS: 0.6 10*3/uL — AB (ref 0.7–4.0)
Lymphocytes Relative: 15 % (ref 12–46)
MCH: 30.3 pg (ref 26.0–34.0)
MCHC: 34.5 g/dL (ref 30.0–36.0)
MCV: 87.8 fL (ref 78.0–100.0)
MONO ABS: 0.2 10*3/uL (ref 0.1–1.0)
Monocytes Relative: 4 % (ref 3–12)
Neutro Abs: 3.2 10*3/uL (ref 1.7–7.7)
Neutrophils Relative %: 79 % — ABNORMAL HIGH (ref 43–77)
PLATELETS: 38 10*3/uL — AB (ref 150–400)
RBC: 4.66 MIL/uL (ref 3.87–5.11)
RDW: 18.5 % — AB (ref 11.5–15.5)
SMEAR REVIEW: DECREASED
WBC: 4 10*3/uL (ref 4.0–10.5)

## 2015-03-05 LAB — BLOOD GAS, ARTERIAL
ACID-BASE EXCESS: 5.6 mmol/L — AB (ref 0.0–2.0)
Bicarbonate: 18.9 mEq/L — ABNORMAL LOW (ref 20.0–24.0)
FIO2: 100 %
O2 CONTENT: 15 L/min
O2 SAT: 35.9 %
Patient temperature: 37
TCO2: 16.7 mmol/L (ref 0–100)
pCO2 arterial: 34.7 mmHg — ABNORMAL LOW (ref 35.0–45.0)
pH, Arterial: 7.355 (ref 7.350–7.450)

## 2015-03-05 LAB — TROPONIN I
TROPONIN I: 0.34 ng/mL — AB (ref <0.031)
Troponin I: 0.34 ng/mL — ABNORMAL HIGH (ref <0.031)
Troponin I: 0.35 ng/mL — ABNORMAL HIGH (ref <0.031)

## 2015-03-05 LAB — GLUCOSE, CAPILLARY
Glucose-Capillary: 122 mg/dL — ABNORMAL HIGH (ref 65–99)
Glucose-Capillary: 136 mg/dL — ABNORMAL HIGH (ref 65–99)
Glucose-Capillary: 134 mg/dL — ABNORMAL HIGH (ref 65–99)

## 2015-03-05 LAB — CBG MONITORING, ED
GLUCOSE-CAPILLARY: 42 mg/dL — AB (ref 65–99)
Glucose-Capillary: 108 mg/dL — ABNORMAL HIGH (ref 65–99)
Glucose-Capillary: 56 mg/dL — ABNORMAL LOW (ref 65–99)
Glucose-Capillary: 123 mg/dL — ABNORMAL HIGH (ref 65–99)
Glucose-Capillary: 146 mg/dL — ABNORMAL HIGH (ref 65–99)

## 2015-03-05 LAB — I-STAT CG4 LACTIC ACID, ED: Lactic Acid, Venous: 2.51 mmol/L (ref 0.5–2.0)

## 2015-03-05 LAB — BRAIN NATRIURETIC PEPTIDE: B Natriuretic Peptide: 571 pg/mL — ABNORMAL HIGH (ref 0.0–100.0)

## 2015-03-05 MED ORDER — ASPIRIN EC 81 MG PO TBEC
81.0000 mg | DELAYED_RELEASE_TABLET | Freq: Every day | ORAL | Status: DC
  Administered 2015-03-06 – 2015-03-11 (×6): 81 mg via ORAL
  Filled 2015-03-05 (×6): qty 1

## 2015-03-05 MED ORDER — DEXTROSE 50 % IV SOLN
INTRAVENOUS | Status: AC
  Filled 2015-03-05: qty 50

## 2015-03-05 MED ORDER — NITROGLYCERIN 0.4 MG SL SUBL: 0.4000 mg | SUBLINGUAL_TABLET | SUBLINGUAL | Status: DC | PRN

## 2015-03-05 MED ORDER — OXYBUTYNIN CHLORIDE ER 10 MG PO TB24
10.0000 mg | ORAL_TABLET | Freq: Every day | ORAL | Status: DC
  Administered 2015-03-06 – 2015-03-11 (×6): 10 mg via ORAL
  Filled 2015-03-05 (×7): qty 1

## 2015-03-05 MED ORDER — ALLOPURINOL 100 MG PO TABS
100.0000 mg | ORAL_TABLET | Freq: Every day | ORAL | Status: DC
  Administered 2015-03-06 – 2015-03-11 (×6): 100 mg via ORAL
  Filled 2015-03-05 (×6): qty 1

## 2015-03-05 MED ORDER — DIGOXIN 125 MCG PO TABS
0.0625 mg | ORAL_TABLET | Freq: Every day | ORAL | Status: DC
  Administered 2015-03-06 – 2015-03-11 (×6): 0.0625 mg via ORAL
  Filled 2015-03-05 (×6): qty 1

## 2015-03-05 MED ORDER — APIXABAN 2.5 MG PO TABS
2.5000 mg | ORAL_TABLET | Freq: Two times a day (BID) | ORAL | Status: DC
  Administered 2015-03-06 – 2015-03-11 (×11): 2.5 mg via ORAL
  Filled 2015-03-05 (×11): qty 1

## 2015-03-05 MED ORDER — DONEPEZIL HCL 5 MG PO TABS
5.0000 mg | ORAL_TABLET | Freq: Every day | ORAL | Status: DC
  Administered 2015-03-06 – 2015-03-11 (×6): 5 mg via ORAL
  Filled 2015-03-05 (×6): qty 1

## 2015-03-05 MED ORDER — DEXTROSE 50 % IV SOLN
25.0000 mL | Freq: Once | INTRAVENOUS | Status: AC
  Administered 2015-03-05: 25 mL via INTRAVENOUS

## 2015-03-05 MED ORDER — ONDANSETRON HCL 4 MG/2ML IJ SOLN: 4.0000 mg | Freq: Four times a day (QID) | INTRAMUSCULAR | Status: DC | PRN

## 2015-03-05 MED ORDER — LEVOTHYROXINE SODIUM 25 MCG PO TABS
25.0000 ug | ORAL_TABLET | Freq: Every day | ORAL | Status: DC
  Administered 2015-03-06 – 2015-03-08 (×3): 25 ug via ORAL
  Filled 2015-03-05 (×3): qty 1

## 2015-03-05 MED ORDER — ASPIRIN 81 MG PO CHEW
324.0000 mg | CHEWABLE_TABLET | Freq: Once | ORAL | Status: AC
  Administered 2015-03-05: 324 mg via ORAL
  Filled 2015-03-05: qty 4

## 2015-03-05 MED ORDER — DEXTROSE-NACL 5-0.45 % IV SOLN
INTRAVENOUS | Status: DC
  Administered 2015-03-05: 17:00:00 via INTRAVENOUS

## 2015-03-05 MED ORDER — PANTOPRAZOLE SODIUM 40 MG PO TBEC
40.0000 mg | DELAYED_RELEASE_TABLET | Freq: Two times a day (BID) | ORAL | Status: DC
  Administered 2015-03-06 – 2015-03-11 (×11): 40 mg via ORAL
  Filled 2015-03-05 (×11): qty 1

## 2015-03-05 MED ORDER — NITROGLYCERIN IN D5W 200-5 MCG/ML-% IV SOLN
10.0000 ug/min | INTRAVENOUS | Status: DC
Start: 2015-03-05 — End: 2015-03-05
  Filled 2015-03-05: qty 250

## 2015-03-05 MED ORDER — ATORVASTATIN CALCIUM 10 MG PO TABS
10.0000 mg | ORAL_TABLET | Freq: Every evening | ORAL | Status: DC
  Administered 2015-03-05 – 2015-03-09 (×5): 10 mg via ORAL
  Filled 2015-03-05 (×5): qty 1

## 2015-03-05 MED ORDER — DILTIAZEM HCL 30 MG PO TABS
30.0000 mg | ORAL_TABLET | Freq: Two times a day (BID) | ORAL | Status: DC
  Administered 2015-03-06 – 2015-03-10 (×10): 30 mg via ORAL
  Filled 2015-03-05 (×11): qty 1

## 2015-03-05 MED ORDER — CYCLOSPORINE 0.05 % OP EMUL
1.0000 [drp] | Freq: Two times a day (BID) | OPHTHALMIC | Status: DC
  Administered 2015-03-06 – 2015-03-11 (×10): 1 [drp] via OPHTHALMIC
  Filled 2015-03-05 (×16): qty 1

## 2015-03-05 MED ORDER — TRAMADOL HCL 50 MG PO TABS: 50.0000 mg | ORAL_TABLET | Freq: Three times a day (TID) | ORAL | Status: DC | PRN

## 2015-03-05 MED ORDER — INSULIN ASPART 100 UNIT/ML ~~LOC~~ SOLN
0.0000 [IU] | Freq: Three times a day (TID) | SUBCUTANEOUS | Status: DC
  Administered 2015-03-07 – 2015-03-08 (×3): 1 [IU] via SUBCUTANEOUS
  Administered 2015-03-09: 2 [IU] via SUBCUTANEOUS
  Administered 2015-03-10: 3 [IU] via SUBCUTANEOUS

## 2015-03-05 MED ORDER — NITROGLYCERIN 0.4 MG SL SUBL
0.4000 mg | SUBLINGUAL_TABLET | SUBLINGUAL | Status: AC | PRN
  Administered 2015-03-05 (×3): 0.4 mg via SUBLINGUAL
  Filled 2015-03-05: qty 1

## 2015-03-05 MED ORDER — DEXTROSE 50 % IV SOLN
1.0000 | Freq: Once | INTRAVENOUS | Status: AC
  Administered 2015-03-05: 50 mL via INTRAVENOUS

## 2015-03-05 MED ORDER — ACETAMINOPHEN 325 MG PO TABS: 650.0000 mg | ORAL_TABLET | ORAL | Status: DC | PRN

## 2015-03-05 NOTE — H&P (Signed)
Referring Physician:  Israella Hubert is an 81 y.o. female.                       Chief Complaint: Chest pain and syncope  HPI: 81 year old black female requested EMS because of chest pain. EMS reports that she had been complaining of chest pain but became unresponsive as they were taking her to the ambulance and she had remained unresponsive. She does have history of coronary artery disease and is followed by cardiologist. Her chest x-ray shows pulmonary edema and bilateral effusions. She was transferred for further care. She had fluid bolus post drop in blood pressure with NTG use. She still has some confusion and speaks very little.  Past Medical History  Diagnosis Date  . Diabetes mellitus   . Hypertension   . Clostridium difficile colitis 01/2013  . Hiatal hernia   . Renal insufficiency   . Hypothyroid   . Left renal artery stenosis   . GERD (gastroesophageal reflux disease)   . Coronary artery disease   . Diverticulitis   . Uterine fibroid   . Shingles   . Diastolic dysfunction   . Gout       Past Surgical History  Procedure Laterality Date  . Renal artery stent Left   . Left heart catheterization with coronary angiogram N/A 11/30/2014    Procedure: LEFT HEART CATHETERIZATION WITH CORONARY ANGIOGRAM;  Surgeon: Dixie Dials, MD;  Location: [facility];  Service: Cardiovascular;  Laterality: N/A;    No family history on file. Social History:  reports that she has never smoked. She does not have any smokeless tobacco history on file. She reports that she does not drink alcohol or use illicit drugs.  Allergies: No Known Allergies  Medications Prior to Admission  Medication Sig Dispense Refill  . allopurinol (ZYLOPRIM) 100 MG tablet Take 100 mg by mouth daily.    Marland Kitchen apixaban (ELIQUIS) 2.5 MG TABS tablet Take 1 tablet (2.5 mg total) by mouth 2 (two) times daily. 60 tablet 3  . colchicine 0.6 MG tablet Take 0.5 tablets (0.3 mg total) by mouth daily. 30 tablet 1  . cycloSPORINE  (RESTASIS) 0.05 % ophthalmic emulsion Place 1 drop into both eyes 2 (two) times daily.    . digoxin (LANOXIN) 0.125 MG tablet Take 0.5 tablets (0.0625 mg total) by mouth daily. 15 tablet 2  . diltiazem (CARDIZEM) 30 MG tablet Take 1 tablet (30 mg total) by mouth every 12 (twelve) hours. 60 tablet 6  . donepezil (ARICEPT) 5 MG tablet Take 5 mg by mouth daily.    Marland Kitchen lactulose (CHRONULAC) 10 GM/15ML solution Take 10 g by mouth 2 (two) times daily.    Marland Kitchen levothyroxine (SYNTHROID, LEVOTHROID) 25 MCG tablet Take 1 tablet (25 mcg total) by mouth daily before breakfast. 30 tablet 3  . oxybutynin (DITROPAN-XL) 10 MG 24 hr tablet Take 10 mg by mouth daily.    . pantoprazole (PROTONIX) 40 MG tablet Take 40 mg by mouth 2 (two) times daily.    . simvastatin (ZOCOR) 20 MG tablet Take 20 mg by mouth every evening.    . traMADol (ULTRAM) 50 MG tablet Take 50 mg by mouth 3 (three) times daily as needed. pain  2  . Vitamin D, Ergocalciferol, (DRISDOL) 50000 UNITS CAPS capsule Take 50,000 Units by mouth every 7 (seven) days.    . metoprolol succinate (TOPROL-XL) 25 MG 24 hr tablet Take 1 tablet (25 mg total) by mouth daily. (Patient not  taking: Reported on 03/05/2015) 30 tablet 4    Results for orders placed or performed during the hospital encounter of 03/05/15 (from the past 48 hour(s))  Comprehensive metabolic panel     Status: Abnormal   Collection Time: 03/05/15  6:30 AM  Result Value Ref Range   Sodium 137 135 - 145 mmol/L   Potassium 3.9 3.5 - 5.1 mmol/L   Chloride 103 101 - 111 mmol/L   CO2 19 (L) 22 - 32 mmol/L   Glucose, Bld 63 (L) 65 - 99 mg/dL   BUN 42 (H) 6 - 20 mg/dL   Creatinine, Ser 1.90 (H) 0.44 - 1.00 mg/dL   Calcium 8.7 (L) 8.9 - 10.3 mg/dL   Total Protein 6.1 (L) 6.5 - 8.1 g/dL   Albumin 2.9 (L) 3.5 - 5.0 g/dL   AST 27 15 - 41 U/L   ALT 24 14 - 54 U/L   Alkaline Phosphatase 97 38 - 126 U/L   Total Bilirubin 2.5 (H) 0.3 - 1.2 mg/dL   GFR calc non Af Amer 23 (L) >60 mL/min   GFR calc Af  Amer 27 (L) >60 mL/min    Comment: (NOTE) The eGFR has been calculated using the CKD EPI equation. This calculation has not been validated in all clinical situations. eGFR's persistently <60 mL/min signify possible Chronic Kidney Disease.    Anion gap 15 5 - 15  Troponin I     Status: Abnormal   Collection Time: 03/05/15  6:30 AM  Result Value Ref Range   Troponin I 0.34 (H) <0.031 ng/mL    Comment:        PERSISTENTLY INCREASED TROPONIN VALUES IN THE RANGE OF 0.04-0.49 ng/mL CAN BE SEEN IN:       -UNSTABLE ANGINA       -CONGESTIVE HEART FAILURE       -MYOCARDITIS       -CHEST TRAUMA       -ARRYHTHMIAS       -LATE PRESENTING MYOCARDIAL INFARCTION       -COPD   CLINICAL FOLLOW-UP RECOMMENDED.   Brain natriuretic peptide     Status: Abnormal   Collection Time: 03/05/15  6:30 AM  Result Value Ref Range   B Natriuretic Peptide 571.0 (H) 0.0 - 100.0 pg/mL  CBC with Differential     Status: Abnormal   Collection Time: 03/05/15  6:30 AM  Result Value Ref Range   WBC 4.0 4.0 - 10.5 K/uL   RBC 4.66 3.87 - 5.11 MIL/uL   Hemoglobin 14.1 12.0 - 15.0 g/dL   HCT 40.9 36.0 - 46.0 %   MCV 87.8 78.0 - 100.0 fL   MCH 30.3 26.0 - 34.0 pg   MCHC 34.5 30.0 - 36.0 g/dL   RDW 18.5 (H) 11.5 - 15.5 %   Platelets 38 (L) 150 - 400 K/uL    Comment: SPECIMEN CHECKED FOR CLOTS PLATELET COUNT CONFIRMED BY SMEAR    Neutrophils Relative % 79 (H) 43 - 77 %   Neutro Abs 3.2 1.7 - 7.7 K/uL   Lymphocytes Relative 15 12 - 46 %   Lymphs Abs 0.6 (L) 0.7 - 4.0 K/uL   Monocytes Relative 4 3 - 12 %   Monocytes Absolute 0.2 0.1 - 1.0 K/uL   Eosinophils Relative 1 0 - 5 %   Eosinophils Absolute 0.1 0.0 - 0.7 K/uL   Basophils Relative 0 0 - 1 %   Basophils Absolute 0.0 0.0 - 0.1 K/uL   Smear Review PLATELETS  APPEAR DECREASED   POC CBG, ED     Status: Abnormal   Collection Time: 03/05/15  6:33 AM  Result Value Ref Range   Glucose-Capillary 56 (L) 65 - 99 mg/dL  Blood gas, arterial     Status: Abnormal    Collection Time: 03/05/15  6:40 AM  Result Value Ref Range   FIO2 100.00 %   O2 Content 15.0 L/min   Delivery systems NON-REBREATHER OXYGEN MASK    pH, Arterial 7.355 7.350 - 7.450   pCO2 arterial 34.7 (L) 35.0 - 45.0 mmHg   pO2, Arterial BELOW REPORTABLE RANGE 80.0 - 100.0 mmHg    Comment: DR Roxanne Mins MD AT 2947 BY ROBERT LANCASTER RR 06/27/201/201   Bicarbonate 18.9 (L) 20.0 - 24.0 mEq/L   TCO2 16.7 0 - 100 mmol/L   Acid-Base Excess 5.6 (H) 0.0 - 2.0 mmol/L   O2 Saturation 35.9 %   Patient temperature 37.0    Collection site FEMORAL ARTERY    Drawn by DRAWN BY RN    Sample type VENOUS   CBG monitoring, ED     Status: Abnormal   Collection Time: 03/05/15  6:40 AM  Result Value Ref Range   Glucose-Capillary 42 (LL) 65 - 99 mg/dL  CBG monitoring, ED     Status: Abnormal   Collection Time: 03/05/15  6:51 AM  Result Value Ref Range   Glucose-Capillary 108 (H) 65 - 99 mg/dL  I-Stat CG4 Lactic Acid, ED     Status: Abnormal   Collection Time: 03/05/15  7:00 AM  Result Value Ref Range   Lactic Acid, Venous 2.51 (HH) 0.5 - 2.0 mmol/L   Comment NOTIFIED PHYSICIAN   CBG monitoring, ED     Status: Abnormal   Collection Time: 03/05/15  8:43 AM  Result Value Ref Range   Glucose-Capillary 123 (H) 65 - 99 mg/dL  CBG monitoring, ED     Status: Abnormal   Collection Time: 03/05/15 12:25 PM  Result Value Ref Range   Glucose-Capillary 146 (H) 65 - 99 mg/dL  Glucose, capillary     Status: Abnormal   Collection Time: 03/05/15  1:47 PM  Result Value Ref Range   Glucose-Capillary 122 (H) 65 - 99 mg/dL   Dg Chest Port 1 View  03/05/2015   CLINICAL DATA:  Chest pain. Shortness of breath. Mental status changes.  EXAM: PORTABLE CHEST - 1 VIEW  COMPARISON:  12/03/2014  FINDINGS: Artifact overlies the chest. There is interstitial and alveolar pulmonary edema. There are bilateral effusions with volume loss in both lower lobes. No acute bony finding.  IMPRESSION: Acute and chronic congestive heart failure.  Pulmonary edema. Bilateral effusions with lower lobe volume loss.   Electronically Signed   By: Nelson Chimes M.D.   On: 03/05/2015 07:06    Review Of Systems Constitutional: Negative for diaphoresis.  Respiratory: Positive for shortness of breath.  Cardiovascular: Positive for chest pain.  Gastrointestinal: Negative for nausea.  Musculoskeletal: Positive for myalgias and joint swelling.  All other systems reviewed and are negative.  Blood pressure 102/64, pulse 78, resp. rate 19, weight 56.7 kg (125 lb), SpO2 98 %.  Physical Exam  Nursing note and vitals reviewed. 81 year old female, resting comfortably and in no acute distress.  Head is normocephalic and atraumatic. Brown eyes, Pupil 1 mm. PERRLA, EOMI. Oropharynx is clear. Neck is nontender and supple without adenopathy or JVD. Back is nontender and there is no CVA tenderness. Lungs are clear without rales, wheezes, or rhonchi. Chest  is tender on palpation anteriorly. Heart has an irregularly irregular rhythm with II/VI systolic murmur. Abdomen is soft, flat, nontender without masses or hepatosplenomegaly and peristalsis is normoactive. Extremities have 2+ edema, full range of motion is present. Skin is cool and dry without rash. Neurologic: She is somewhat confused and responds with few words or head nodding only. No obvious focal motor or sensory deficits  Assessment/Plan Chest pain r/o MI Atrial fibrillation with controlled ventricular response, CHADS2VASC score of 5/9. DM, II Hypertension CAD CKD, II Hypothyroidism Hypothrmia  Admit to 3W since step down bed is not available. DC NTG drip. Home medications.  Birdie Riddle, MD  03/05/2015, 3:08 PM

## 2015-03-05 NOTE — Progress Notes (Addendum)
Received pt from [facility] brought by [facility] into room 3W09; alert, confused and oriented to self only. Receiving NTG drip at 10 mcg. C/O complaint of chest, unable to rate or describe nature. EKG obtained, in Afib. Dr Doylene Canard paged and made aware. In no acute distress, patient falling asleep at short intervals.

## 2015-03-05 NOTE — ED Notes (Signed)
Pt complaining of chest pain , Md aware orders give to repeat nitro SL

## 2015-03-05 NOTE — ED Notes (Signed)
Pt arrived by EMS called out for chest pains. Pt became more lethargic when being moved to truck. Pt was scheduled for stents 2 months ago But cardiologist states too weak & was started on ASA.

## 2015-03-05 NOTE — ED Provider Notes (Signed)
CSN: [id number]     Arrival date & time 03/05/15  1914 History   First MD Initiated Contact with Patient 03/05/15 0617     Chief Complaint  Patient presents with  . Chest Pain     (Consider location/radiation/quality/duration/timing/severity/associated sxs/prior Treatment) Patient is a 81 y.o. female presenting with chest pain. The history is provided by the EMS personnel. The history is limited by the condition of the patient (Altered mental status).  Chest Pain She came in by ambulance after requesting EMS because of chest pain. EMS reports that she had been complaining of chest pain but became unresponsive as they were taking her to the ambulance and she has remained unresponsive. She does have history of coronary artery disease and is followed by cardiologist. Additional history is not available.  Past Medical History  Diagnosis Date  . Diabetes mellitus   . Hypertension   . Clostridium difficile colitis 01/2013  . Hiatal hernia   . Renal insufficiency   . Hypothyroid   . Left renal artery stenosis   . GERD (gastroesophageal reflux disease)   . Coronary artery disease   . Diverticulitis   . Uterine fibroid   . Shingles   . Diastolic dysfunction   . Gout    Past Surgical History  Procedure Laterality Date  . Renal artery stent Left   . Left heart catheterization with coronary angiogram N/A 11/30/2014    Procedure: LEFT HEART CATHETERIZATION WITH CORONARY ANGIOGRAM;  Surgeon: Dixie Dials, MD;  Location: [facility];  Service: Cardiovascular;  Laterality: N/A;   No family history on file. History  Substance Use Topics  . Smoking status: Never Smoker   . Smokeless tobacco: Not on file  . Alcohol Use: No   OB History    No data available     Review of Systems  Unable to perform ROS: Mental status change  Cardiovascular: Positive for chest pain.      Allergies  Review of patient's allergies indicates no known allergies.  Home Medications   Prior to Admission  medications   Medication Sig Start Date End Date Taking? Authorizing Provider  allopurinol (ZYLOPRIM) 100 MG tablet Take 100 mg by mouth daily.   Yes Historical Provider, MD  colchicine 0.6 MG tablet Take 0.5 tablets (0.3 mg total) by mouth daily. 12/03/14  Yes Dixie Dials, MD  digoxin (LANOXIN) 0.125 MG tablet Take 0.5 tablets (0.0625 mg total) by mouth daily. 12/03/14  Yes Dixie Dials, MD  diltiazem (CARDIZEM) 30 MG tablet Take 1 tablet (30 mg total) by mouth every 12 (twelve) hours. 12/03/14  Yes Dixie Dials, MD  donepezil (ARICEPT) 5 MG tablet Take 5 mg by mouth daily. 05/17/13  Yes Historical Provider, MD  levothyroxine (SYNTHROID, LEVOTHROID) 25 MCG tablet Take 1 tablet (25 mcg total) by mouth daily before breakfast. 12/03/14  Yes Dixie Dials, MD  oxybutynin (DITROPAN-XL) 10 MG 24 hr tablet Take 10 mg by mouth daily.   Yes Historical Provider, MD  pantoprazole (PROTONIX) 40 MG tablet Take 40 mg by mouth 2 (two) times daily. 06/02/13  Yes Historical Provider, MD  simvastatin (ZOCOR) 20 MG tablet Take 20 mg by mouth every evening.   Yes Historical Provider, MD  traMADol (ULTRAM) 50 MG tablet Take 50 mg by mouth 3 (three) times daily as needed. pain 11/16/14  Yes Historical Provider, MD  Vitamin D, Ergocalciferol, (DRISDOL) 50000 UNITS CAPS capsule Take 50,000 Units by mouth every 7 (seven) days.   Yes Historical Provider, MD  apixaban Arne Cleveland)  2.5 MG TABS tablet Take 1 tablet (2.5 mg total) by mouth 2 (two) times daily. 12/03/14   Dixie Dials, MD  cycloSPORINE (RESTASIS) 0.05 % ophthalmic emulsion Place 1 drop into both eyes 2 (two) times daily.    Historical Provider, MD  diphenhydrAMINE (BENADRYL) 12.5 MG/5ML elixir Take 5 mLs (12.5 mg total) by mouth every 6 (six) hours as needed for itching. 09/13/13   Evalee Jefferson, PA-C  lactulose (CHRONULAC) 10 GM/15ML solution Take 10 g by mouth 2 (two) times daily.    Historical Provider, MD  metoprolol succinate (TOPROL-XL) 25 MG 24 hr tablet Take 1 tablet (25  mg total) by mouth daily. 12/03/14   Dixie Dials, MD  temazepam (RESTORIL) 15 MG capsule Take 15 mg by mouth at bedtime as needed. Sleep    Historical Provider, MD   BP 133/110 mmHg  Pulse 106  Resp 28  Wt 125 lb (56.7 kg)  SpO2 91% Physical Exam  Nursing note and vitals reviewed.  81 year old female, resting comfortably and in no acute distress. Vital signs are significant for tachypnea, tachycardia, and hypertension. Oxygen saturation is 91%, which is hypoxic. Head is normocephalic and atraumatic. PERRLA, EOMI. Oropharynx is clear. Neck is nontender and supple without adenopathy or JVD. Back is nontender and there is no CVA tenderness. Lungs are clear without rales, wheezes, or rhonchi. Chest is nontender. Heart has an irregularly irregular rhythm without murmur. Abdomen is soft, flat, nontender without masses or hepatosplenomegaly and peristalsis is normoactive. Extremities have 2+ edema, full range of motion is present. Skin is warm and dry without rash. Neurologic: She is somnolent and responds only to deep pain. No obvious focal motor or sensory deficits..  ED Course  Procedures (including critical care time) Procedure: Femoral vein blood draw Indication: Inability to draw blood from peripheral veins Location: Left femoral vein Implied consent for procedure because of altered mental status Needle size: 21-gauge Successful upon withdrawal was accomplished and blood sent for laboratory testing. Patient tolerated procedure well.  Labs Review Results for orders placed or performed during the hospital encounter of 03/05/15  Comprehensive metabolic panel  Result Value Ref Range   Sodium 137 135 - 145 mmol/L   Potassium 3.9 3.5 - 5.1 mmol/L   Chloride 103 101 - 111 mmol/L   CO2 19 (L) 22 - 32 mmol/L   Glucose, Bld 63 (L) 65 - 99 mg/dL   BUN 42 (H) 6 - 20 mg/dL   Creatinine, Ser 1.90 (H) 0.44 - 1.00 mg/dL   Calcium 8.7 (L) 8.9 - 10.3 mg/dL   Total Protein 6.1 (L) 6.5 - 8.1  g/dL   Albumin 2.9 (L) 3.5 - 5.0 g/dL   AST 27 15 - 41 U/L   ALT 24 14 - 54 U/L   Alkaline Phosphatase 97 38 - 126 U/L   Total Bilirubin 2.5 (H) 0.3 - 1.2 mg/dL   GFR calc non Af Amer 23 (L) >60 mL/min   GFR calc Af Amer 27 (L) >60 mL/min   Anion gap 15 5 - 15  Troponin I  Result Value Ref Range   Troponin I 0.34 (H) <0.031 ng/mL  Blood gas, arterial  Result Value Ref Range   FIO2 100.00 %   O2 Content 15.0 L/min   Delivery systems NON-REBREATHER OXYGEN MASK    pH, Arterial 7.355 7.350 - 7.450   pCO2 arterial 34.7 (L) 35.0 - 45.0 mmHg   pO2, Arterial BELOW REPORTABLE RANGE 80.0 - 100.0 mmHg   Bicarbonate 18.9 (  L) 20.0 - 24.0 mEq/L   TCO2 16.7 0 - 100 mmol/L   Acid-Base Excess 5.6 (H) 0.0 - 2.0 mmol/L   O2 Saturation 35.9 %   Patient temperature 37.0    Collection site FEMORAL ARTERY    Drawn by DRAWN BY RN    Sample type VENOUS   POC CBG, ED  Result Value Ref Range   Glucose-Capillary 56 (L) 65 - 99 mg/dL  I-Stat CG4 Lactic Acid, ED  Result Value Ref Range   Lactic Acid, Venous 2.51 (HH) 0.5 - 2.0 mmol/L   Comment NOTIFIED PHYSICIAN   CBG monitoring, ED  Result Value Ref Range   Glucose-Capillary 42 (LL) 65 - 99 mg/dL  CBG monitoring, ED  Result Value Ref Range   Glucose-Capillary 108 (H) 65 - 99 mg/dL    Imaging Review Dg Chest Port 1 View  03/05/2015   CLINICAL DATA:  Chest pain. Shortness of breath. Mental status changes.  EXAM: PORTABLE CHEST - 1 VIEW  COMPARISON:  12/03/2014  FINDINGS: Artifact overlies the chest. There is interstitial and alveolar pulmonary edema. There are bilateral effusions with volume loss in both lower lobes. No acute bony finding.  IMPRESSION: Acute and chronic congestive heart failure. Pulmonary edema. Bilateral effusions with lower lobe volume loss.   Electronically Signed   By: Nelson Chimes M.D.   On: 03/05/2015 07:06   Images viewed by me.   EKG Interpretation   Date/Time:  Monday March 05 2015 06:24:40 EDT Ventricular Rate:  91 PR  Interval:    QRS Duration: 84 QT Interval:  314 QTC Calculation: 386 R Axis:   -47 Text Interpretation:  Atrial fibrillation Left axis deviation Low voltage  QRS Inferior infarct , age undetermined Cannot rule out Anterior infarct ,  age undetermined Abnormal ECG When compared with ECG of 12/01/2014, No  significant change was found Confirmed by [facility]  MD, Ardyce Heyer ([id number]) on  03/05/2015 6:34:53 AM      CRITICAL CARE Performed by: XBJYN,WGNFA Total critical care time: 80 minutes Critical care time was exclusive of separately billable procedures and treating other patients. Critical care was necessary to treat or prevent imminent or life-threatening deterioration. Critical care was time spent personally by me on the following activities: development of treatment plan with patient and/or surrogate as well as nursing, discussions with consultants, evaluation of patient's response to treatment, examination of patient, obtaining history from patient or surrogate, ordering and performing treatments and interventions, ordering and review of laboratory studies, ordering and review of radiographic studies, pulse oximetry and re-evaluation of patient's condition.  MDM   Final diagnoses:  Chest pain, unspecified chest pain type   Chest pain. Hypoglycemia. Old records reviewed and she had catheterization in March which showed 2 lesions of 70% and she had declined stent placing at that time. She was given D50 in the emergency department and then woke up and was complaining of chest pain. She is given aspirin and nitroglycerin. ECG shows atrial fibrillation and she is known to be anticoagulated with apixaban. Old records are reviewed and she had cardiac catheterization in March showing 70% lesions in the circumflex and right coronary arteries.  Following nitroglycerin, she became hypotensive with blood pressure dropping 85/56. Chest x-ray of appears to show CHF. BNP is pending at this time but with x-ray  findings, I am hesitant to try to give her additional IV fluid. Case is discussed with Dr. Doylene Canard who agrees to accept the patient. She is transferred to [facility].  Delora Fuel, MD 49/67/59 [id number]

## 2015-03-06 LAB — BASIC METABOLIC PANEL
Anion gap: 13 (ref 5–15)
BUN: 40 mg/dL — ABNORMAL HIGH (ref 6–20)
CALCIUM: 8.1 mg/dL — AB (ref 8.9–10.3)
CHLORIDE: 105 mmol/L (ref 101–111)
CO2: 16 mmol/L — ABNORMAL LOW (ref 22–32)
CREATININE: 1.94 mg/dL — AB (ref 0.44–1.00)
GFR calc Af Amer: 27 mL/min — ABNORMAL LOW (ref >60)
GFR calc non Af Amer: 23 mL/min — ABNORMAL LOW (ref >60)
GLUCOSE: 131 mg/dL — AB (ref 65–99)
Potassium: 3.7 mmol/L (ref 3.5–5.1)
Sodium: 134 mmol/L — ABNORMAL LOW (ref 135–145)

## 2015-03-06 LAB — PROTIME-INR
INR: 1.39 (ref 0.00–1.49)
PROTHROMBIN TIME: 17.2 s — AB (ref 11.6–15.2)

## 2015-03-06 LAB — CBC
HCT: 39.8 % (ref 36.0–46.0)
Hemoglobin: 14.7 g/dL (ref 12.0–15.0)
MCH: 30.9 pg (ref 26.0–34.0)
MCHC: 36.9 g/dL — ABNORMAL HIGH (ref 30.0–36.0)
MCV: 83.6 fL (ref 78.0–100.0)
Platelets: 35 10*3/uL — ABNORMAL LOW (ref 150–400)
RBC: 4.76 MIL/uL (ref 3.87–5.11)
RDW: 18.1 % — ABNORMAL HIGH (ref 11.5–15.5)
WBC: 5.1 10*3/uL (ref 4.0–10.5)

## 2015-03-06 LAB — GLUCOSE, CAPILLARY
GLUCOSE-CAPILLARY: 125 mg/dL — AB (ref 65–99)
GLUCOSE-CAPILLARY: 133 mg/dL — AB (ref 65–99)
Glucose-Capillary: 119 mg/dL — ABNORMAL HIGH (ref 65–99)
Glucose-Capillary: 106 mg/dL — ABNORMAL HIGH (ref 65–99)

## 2015-03-06 LAB — URINALYSIS, ROUTINE W REFLEX MICROSCOPIC
GLUCOSE, UA: NEGATIVE mg/dL
Hgb urine dipstick: NEGATIVE
Ketones, ur: 15 mg/dL — AB
Nitrite: POSITIVE — AB
Protein, ur: NEGATIVE mg/dL
SPECIFIC GRAVITY, URINE: 1.023 (ref 1.005–1.030)
Urobilinogen, UA: 1 mg/dL (ref 0.0–1.0)
pH: 5 (ref 5.0–8.0)

## 2015-03-06 LAB — URINE MICROSCOPIC-ADD ON

## 2015-03-06 LAB — LIPID PANEL
CHOLESTEROL: 278 mg/dL — AB (ref 0–200)
HDL: 37 mg/dL — ABNORMAL LOW (ref >40)
LDL Cholesterol: 194 mg/dL — ABNORMAL HIGH (ref 0–99)
TRIGLYCERIDES: 236 mg/dL — AB (ref <150)
Total CHOL/HDL Ratio: 7.5 RATIO
VLDL: 47 mg/dL — ABNORMAL HIGH (ref 0–40)

## 2015-03-06 LAB — DIGOXIN LEVEL: Digoxin Level: 0.5 ng/mL — ABNORMAL LOW (ref 0.8–2.0)

## 2015-03-06 LAB — TROPONIN I: Troponin I: 0.33 ng/mL — ABNORMAL HIGH (ref <0.031)

## 2015-03-06 MED ORDER — FLEET ENEMA 7-19 GM/118ML RE ENEM
1.0000 | ENEMA | Freq: Once | RECTAL | Status: AC
  Administered 2015-03-06: 1 via RECTAL
  Filled 2015-03-06: qty 1

## 2015-03-06 MED ORDER — FUROSEMIDE 10 MG/ML IJ SOLN
20.0000 mg | Freq: Once | INTRAMUSCULAR | Status: AC
  Administered 2015-03-06: 20 mg via INTRAVENOUS
  Filled 2015-03-06: qty 2

## 2015-03-06 MED ORDER — SODIUM BICARBONATE 8.4 % IV SOLN
50.0000 meq | Freq: Once | INTRAVENOUS | Status: AC
  Administered 2015-03-06: 50 meq via INTRAVENOUS
  Filled 2015-03-06: qty 50

## 2015-03-06 MED ORDER — CEFTRIAXONE SODIUM IN DEXTROSE 20 MG/ML IV SOLN
1.0000 g | INTRAVENOUS | Status: DC
  Administered 2015-03-06 – 2015-03-09 (×4): 1 g via INTRAVENOUS
  Filled 2015-03-06 (×5): qty 50

## 2015-03-06 NOTE — Discharge Instructions (Signed)

## 2015-03-06 NOTE — Progress Notes (Signed)
Utilization review complete. Nathanial Arrighi RN CCM Case Mgmt phone [phone] 

## 2015-03-06 NOTE — Progress Notes (Signed)
ACE/ARB therapy was not prescribed due to medical contraindication.

## 2015-03-07 ENCOUNTER — Encounter (HOSPITAL_COMMUNITY): Payer: Self-pay | Admitting: General Practice

## 2015-03-07 LAB — HEMOGLOBIN A1C
HEMOGLOBIN A1C: 6.3 % — AB (ref 4.8–5.6)
Mean Plasma Glucose: 134 mg/dL

## 2015-03-07 LAB — GLUCOSE, CAPILLARY
Glucose-Capillary: 81 mg/dL (ref 65–99)
Glucose-Capillary: 131 mg/dL — ABNORMAL HIGH (ref 65–99)
Glucose-Capillary: 84 mg/dL (ref 65–99)
Glucose-Capillary: 81 mg/dL (ref 65–99)

## 2015-03-07 MED ORDER — MORPHINE SULFATE 4 MG/ML IJ SOLN: 4.0000 mg | INTRAMUSCULAR | Status: DC | PRN

## 2015-03-07 NOTE — Care Management (Signed)
Important Message  Patient Details  Name: [name] [name] MRN: [id number] Date of Birth: 02-18-1931   Medicare Important Message Given:  Yes-second notification given    Louanne Belton 03/07/2015, 2:52 PM

## 2015-03-07 NOTE — Progress Notes (Signed)
Ref: [name],[name], MD   Subjective:  Afebrile. Awake. Need PT evaluation. Some confusion persist.  Objective:  Vital Signs in the last 24 hours: Temp:  [97.4 F (36.3 C)-97.6 F (36.4 C)] 97.5 F (36.4 C) (06/29 0451) Pulse Rate:  [61-66] 66 (06/29 0935) Cardiac Rhythm:  [-] Atrial fibrillation (06/29 0750) Resp:  [14-16] 14 (06/29 0935) BP: (100-113)/(49-65) 105/49 mmHg (06/29 0935) SpO2:  [93 %-96 %] 93 % (06/29 0935) Weight:  [58.196 kg (128 lb 4.8 oz)] 58.196 kg (128 lb 4.8 oz) (06/29 0451)  Physical Exam: BP Readings from Last 1 Encounters:  03/07/15 105/49    Wt Readings from Last 1 Encounters:  03/07/15 58.196 kg (128 lb 4.8 oz)    Weight change: -1.996 kg (-4 lb 6.4 oz)  HEENT: [place]/AT, Eyes-Brown, PERL, EOMI, Conjunctiva-Pink, Sclera-Non-icteric Neck: No JVD, No bruit, Trachea midline. Lungs:  Clear, Bilateral. Cardiac:  Regular rhythm, normal S1 and S2, no S3. II/VI systolic murmur Abdomen:  Soft, non-tender. Extremities:  1 + edema present. No cyanosis. No clubbing. CNS: AxOx2, Cranial nerves grossly intact, moves all 4 extremities. Right handed. Skin: Warm and dry.   Intake/Output from previous day: 06/28 0701 - 06/29 0700 In: 310 [P.O.:260; IV Piggyback:50] Out: -     Lab Results: BMET    Component Value Date/Time   NA 134* 03/06/2015 0425   NA 137 03/05/2015 0630   NA 128* 12/03/2014 0250   K 3.7 03/06/2015 0425   K 3.9 03/05/2015 0630   K 4.0 12/03/2014 0250   CL 105 03/06/2015 0425   CL 103 03/05/2015 0630   CL 99 12/03/2014 0250   CO2 16* 03/06/2015 0425   CO2 19* 03/05/2015 0630   CO2 21 12/03/2014 0250   GLUCOSE 131* 03/06/2015 0425   GLUCOSE 63* 03/05/2015 0630   GLUCOSE 77 12/03/2014 0250   BUN 40* 03/06/2015 0425   BUN 42* 03/05/2015 0630   BUN 37* 12/03/2014 0250   CREATININE 1.94* 03/06/2015 0425   CREATININE 1.90* 03/05/2015 0630   CREATININE 1.54* 12/03/2014 0250   CALCIUM 8.1* 03/06/2015 0425   CALCIUM 8.7*  03/05/2015 0630   CALCIUM 8.5 12/03/2014 0250   GFRNONAA 23* 03/06/2015 0425   GFRNONAA 23* 03/05/2015 0630   GFRNONAA 30* 12/03/2014 0250   GFRAA 27* 03/06/2015 0425   GFRAA 27* 03/05/2015 0630   GFRAA 35* 12/03/2014 0250   CBC    Component Value Date/Time   WBC 5.1 03/06/2015 0818   RBC 4.76 03/06/2015 0818   HGB 14.7 03/06/2015 0818   HCT 39.8 03/06/2015 0818   PLT 35* 03/06/2015 0818   MCV 83.6 03/06/2015 0818   MCH 30.9 03/06/2015 0818   MCHC 36.9* 03/06/2015 0818   RDW 18.1* 03/06/2015 0818   LYMPHSABS 0.6* 03/05/2015 0630   MONOABS 0.2 03/05/2015 0630   EOSABS 0.1 03/05/2015 0630   BASOSABS 0.0 03/05/2015 0630   HEPATIC Function Panel  Recent Labs  03/16/14 1708 03/18/14 0632 03/05/15 0630  PROT 7.1 6.7 6.1*   HEMOGLOBIN A1C No components found for: HGA1C,  MPG CARDIAC ENZYMES Lab Results  Component Value Date   CKTOTAL 381* 01/20/2012   CKMB 9.1* 01/20/2012   TROPONINI 0.33* 03/06/2015   TROPONINI 0.34* 03/05/2015   TROPONINI 0.35* 03/05/2015   BNP  Recent Labs  03/16/14 1708  PROBNP 6646.0*   TSH  Recent Labs  03/16/14 2305  TSH 6.570*   CHOLESTEROL  Recent Labs  03/06/15 0818  CHOL 278*    Scheduled Meds: .  allopurinol  100 mg Oral Daily  . apixaban  2.5 mg Oral BID  . aspirin EC  81 mg Oral Daily  . atorvastatin  10 mg Oral QPM  . cefTRIAXone (ROCEPHIN)  IV  1 g Intravenous Q24H  . cycloSPORINE  1 drop Both Eyes BID  . digoxin  0.0625 mg Oral Daily  . diltiazem  30 mg Oral Q12H  . donepezil  5 mg Oral Daily  . insulin aspart  0-9 Units Subcutaneous TID WC  . levothyroxine  25 mcg Oral QAC breakfast  . oxybutynin  10 mg Oral Daily  . pantoprazole  40 mg Oral BID   Continuous Infusions:  PRN Meds:.acetaminophen, nitroGLYCERIN, ondansetron (ZOFRAN) IV, traMADol  Assessment/Plan: Chest pain r/o MI Atrial fibrillation with controlled ventricular response, CHADS2VASC score of 5/9. DM, II Hypertension CAD CKD,  II Hypothyroidism Hypothermia UTI  Continue medical treatment. PT evaluation.    LOS: 2 days    Dixie Dials  MD  03/07/2015, 1:48 PM

## 2015-03-07 NOTE — Progress Notes (Signed)
Late Entry. Ref: Leola Brazil, MD   Subjective:  Awakens easily. Afebrile. UA positive for UTI.  Objective:  Vital Signs in the last 24 hours: Temp:  [97.4 F (36.3 C)-97.6 F (36.4 C)] 97.5 F (36.4 C) (06/29 0451) Pulse Rate:  [61-66] 66 (06/29 0935) Cardiac Rhythm:  [-] Atrial fibrillation (06/29 0750) Resp:  [14-16] 14 (06/29 0935) BP: (100-113)/(49-65) 105/49 mmHg (06/29 0935) SpO2:  [93 %-96 %] 93 % (06/29 0935) Weight:  [58.196 kg (128 lb 4.8 oz)] 58.196 kg (128 lb 4.8 oz) (06/29 0451)  Physical Exam: BP Readings from Last 1 Encounters:  03/07/15 105/49    Wt Readings from Last 1 Encounters:  03/07/15 58.196 kg (128 lb 4.8 oz)    Weight change: -1.996 kg (-4 lb 6.4 oz)  HEENT: [place]/AT, Eyes-Brown, PERL, EOMI, Conjunctiva-Pink, Sclera-Non-icteric Neck: No JVD, No bruit, Trachea midline. Lungs:  Clear, Bilateral. Cardiac:  Regular rhythm, normal S1 and S2, no S3. II/VI systolic murmur. Abdomen:  Soft, non-tender. Extremities:  1 + edema present. No cyanosis. No clubbing. CNS: AxOx1, Cranial nerves grossly intact, moves all 4 extremities.  Skin: Warm and dry.   Intake/Output from previous day: 06/28 0701 - 06/29 0700 In: 310 [P.O.:260; IV Piggyback:50] Out: -     Lab Results: BMET    Component Value Date/Time   NA 134* 03/06/2015 0425   NA 137 03/05/2015 0630   NA 128* 12/03/2014 0250   K 3.7 03/06/2015 0425   K 3.9 03/05/2015 0630   K 4.0 12/03/2014 0250   CL 105 03/06/2015 0425   CL 103 03/05/2015 0630   CL 99 12/03/2014 0250   CO2 16* 03/06/2015 0425   CO2 19* 03/05/2015 0630   CO2 21 12/03/2014 0250   GLUCOSE 131* 03/06/2015 0425   GLUCOSE 63* 03/05/2015 0630   GLUCOSE 77 12/03/2014 0250   BUN 40* 03/06/2015 0425   BUN 42* 03/05/2015 0630   BUN 37* 12/03/2014 0250   CREATININE 1.94* 03/06/2015 0425   CREATININE 1.90* 03/05/2015 0630   CREATININE 1.54* 12/03/2014 0250   CALCIUM 8.1* 03/06/2015 0425   CALCIUM 8.7* 03/05/2015 0630    CALCIUM 8.5 12/03/2014 0250   GFRNONAA 23* 03/06/2015 0425   GFRNONAA 23* 03/05/2015 0630   GFRNONAA 30* 12/03/2014 0250   GFRAA 27* 03/06/2015 0425   GFRAA 27* 03/05/2015 0630   GFRAA 35* 12/03/2014 0250   CBC    Component Value Date/Time   WBC 5.1 03/06/2015 0818   RBC 4.76 03/06/2015 0818   HGB 14.7 03/06/2015 0818   HCT 39.8 03/06/2015 0818   PLT 35* 03/06/2015 0818   MCV 83.6 03/06/2015 0818   MCH 30.9 03/06/2015 0818   MCHC 36.9* 03/06/2015 0818   RDW 18.1* 03/06/2015 0818   LYMPHSABS 0.6* 03/05/2015 0630   MONOABS 0.2 03/05/2015 0630   EOSABS 0.1 03/05/2015 0630   BASOSABS 0.0 03/05/2015 0630   HEPATIC Function Panel  Recent Labs  03/16/14 1708 03/18/14 0632 03/05/15 0630  PROT 7.1 6.7 6.1*   HEMOGLOBIN A1C No components found for: HGA1C,  MPG CARDIAC ENZYMES Lab Results  Component Value Date   CKTOTAL 381* 01/20/2012   CKMB 9.1* 01/20/2012   TROPONINI 0.33* 03/06/2015   TROPONINI 0.34* 03/05/2015   TROPONINI 0.35* 03/05/2015   BNP  Recent Labs  03/16/14 1708  PROBNP 6646.0*   TSH  Recent Labs  03/16/14 2305  TSH 6.570*   CHOLESTEROL  Recent Labs  03/06/15 0818  CHOL 278*    Scheduled Meds: .  allopurinol  100 mg Oral Daily  . apixaban  2.5 mg Oral BID  . aspirin EC  81 mg Oral Daily  . atorvastatin  10 mg Oral QPM  . cefTRIAXone (ROCEPHIN)  IV  1 g Intravenous Q24H  . cycloSPORINE  1 drop Both Eyes BID  . digoxin  0.0625 mg Oral Daily  . diltiazem  30 mg Oral Q12H  . donepezil  5 mg Oral Daily  . insulin aspart  0-9 Units Subcutaneous TID WC  . levothyroxine  25 mcg Oral QAC breakfast  . oxybutynin  10 mg Oral Daily  . pantoprazole  40 mg Oral BID   Continuous Infusions:  PRN Meds:.acetaminophen, nitroGLYCERIN, ondansetron (ZOFRAN) IV, traMADol  Assessment/Plan: Chest pain r/o MI Atrial fibrillation with controlled ventricular response, CHADS2VASC score of 5/9. DM, II Hypertension CAD CKD,  II Hypothyroidism Hypothermia UTI  Add IV rocephin. Discussed limited code with family.   LOS: 2 days    Dixie Dials  MD  03/07/2015, 1:45 PM

## 2015-03-08 LAB — CBC
HCT: 43.3 % (ref 36.0–46.0)
Hemoglobin: 15.1 g/dL — ABNORMAL HIGH (ref 12.0–15.0)
MCH: 30.6 pg (ref 26.0–34.0)
MCHC: 34.9 g/dL (ref 30.0–36.0)
MCV: 87.7 fL (ref 78.0–100.0)
Platelets: 45 10*3/uL — ABNORMAL LOW (ref 150–400)
RBC: 4.94 MIL/uL (ref 3.87–5.11)
RDW: 18.4 % — ABNORMAL HIGH (ref 11.5–15.5)
WBC: 6 10*3/uL (ref 4.0–10.5)

## 2015-03-08 LAB — BASIC METABOLIC PANEL
Anion gap: 16 — ABNORMAL HIGH (ref 5–15)
BUN: 31 mg/dL — ABNORMAL HIGH (ref 6–20)
CO2: 20 mmol/L — AB (ref 22–32)
Calcium: 8 mg/dL — ABNORMAL LOW (ref 8.9–10.3)
Chloride: 97 mmol/L — ABNORMAL LOW (ref 101–111)
Creatinine, Ser: 1.92 mg/dL — ABNORMAL HIGH (ref 0.44–1.00)
GFR calc non Af Amer: 23 mL/min — ABNORMAL LOW (ref >60)
GFR, EST AFRICAN AMERICAN: 27 mL/min — AB (ref >60)
Glucose, Bld: 150 mg/dL — ABNORMAL HIGH (ref 65–99)
Potassium: 3.7 mmol/L (ref 3.5–5.1)
Sodium: 133 mmol/L — ABNORMAL LOW (ref 135–145)

## 2015-03-08 LAB — GLUCOSE, CAPILLARY
GLUCOSE-CAPILLARY: 146 mg/dL — AB (ref 65–99)
Glucose-Capillary: 116 mg/dL — ABNORMAL HIGH (ref 65–99)
Glucose-Capillary: 121 mg/dL — ABNORMAL HIGH (ref 65–99)
Glucose-Capillary: 71 mg/dL (ref 65–99)
Glucose-Capillary: 82 mg/dL (ref 65–99)

## 2015-03-08 LAB — TSH: TSH: 66.617 u[IU]/mL — ABNORMAL HIGH (ref 0.350–4.500)

## 2015-03-08 MED ORDER — FUROSEMIDE 40 MG PO TABS
40.0000 mg | ORAL_TABLET | Freq: Once | ORAL | Status: AC
  Administered 2015-03-08: 40 mg via ORAL
  Filled 2015-03-08: qty 2

## 2015-03-08 NOTE — Progress Notes (Signed)
Pt unable to void. Bladder scan showed greater than 580 ml of urine.  Md on call made aware. New order received for an in and out cath. 600 ml of urine excreted via strait cath. Pt bathed and resting in bed. Will cont to monitor pt.

## 2015-03-08 NOTE — Progress Notes (Signed)
Ref: [name],[name], MD   Subjective:  No hypothermia. More awake but some confusion persist.  Objective:  Vital Signs in the last 24 hours: Temp:  [97.5 F (36.4 C)-97.8 F (36.6 C)] 97.8 F (36.6 C) (06/30 0500) Pulse Rate:  [64-68] 64 (06/30 0500) Cardiac Rhythm:  [-] Atrial fibrillation (06/30 0730) Resp:  [20-21] 21 (06/30 0500) BP: (101-107)/(62-66) 101/62 mmHg (06/30 1015) SpO2:  [98 %-99 %] 98 % (06/30 0500) Weight:  [59.013 kg (130 lb 1.6 oz)] 59.013 kg (130 lb 1.6 oz) (06/30 0500)  Physical Exam: BP Readings from Last 1 Encounters:  03/08/15 101/62    Wt Readings from Last 1 Encounters:  03/08/15 59.013 kg (130 lb 1.6 oz)    Weight change: 0.816 kg (1 lb 12.8 oz)  HEENT: [place]/AT, Eyes-Brown, PERL, EOMI, Conjunctiva-Pink, Sclera-Non-icteric Neck: No JVD, No bruit, Trachea midline. Lungs:  Clear, Bilateral. Cardiac:  Regular rhythm, normal S1 and S2, no S3. II/VI systolic murmur. Abdomen:  Soft, non-tender. Extremities:  1+ edema present. No cyanosis. No clubbing. CNS: AxOx2, Cranial nerves grossly intact, moves all 4 extremities. Right handed. Skin: Warm and dry.   Intake/Output from previous day: 06/29 0701 - 06/30 0700 In: 470 [P.O.:420; IV Piggyback:50] Out: 600 [Urine:600]    Lab Results: BMET    Component Value Date/Time   NA 133* 03/08/2015 0700   NA 134* 03/06/2015 0425   NA 137 03/05/2015 0630   K 3.7 03/08/2015 0700   K 3.7 03/06/2015 0425   K 3.9 03/05/2015 0630   CL 97* 03/08/2015 0700   CL 105 03/06/2015 0425   CL 103 03/05/2015 0630   CO2 20* 03/08/2015 0700   CO2 16* 03/06/2015 0425   CO2 19* 03/05/2015 0630   GLUCOSE 150* 03/08/2015 0700   GLUCOSE 131* 03/06/2015 0425   GLUCOSE 63* 03/05/2015 0630   BUN 31* 03/08/2015 0700   BUN 40* 03/06/2015 0425   BUN 42* 03/05/2015 0630   CREATININE 1.92* 03/08/2015 0700   CREATININE 1.94* 03/06/2015 0425   CREATININE 1.90* 03/05/2015 0630   CALCIUM 8.0* 03/08/2015 0700   CALCIUM  8.1* 03/06/2015 0425   CALCIUM 8.7* 03/05/2015 0630   GFRNONAA 23* 03/08/2015 0700   GFRNONAA 23* 03/06/2015 0425   GFRNONAA 23* 03/05/2015 0630   GFRAA 27* 03/08/2015 0700   GFRAA 27* 03/06/2015 0425   GFRAA 27* 03/05/2015 0630   CBC    Component Value Date/Time   WBC 6.0 03/08/2015 0505   RBC 4.94 03/08/2015 0505   HGB 15.1* 03/08/2015 0505   HCT 43.3 03/08/2015 0505   PLT 45* 03/08/2015 0505   MCV 87.7 03/08/2015 0505   MCH 30.6 03/08/2015 0505   MCHC 34.9 03/08/2015 0505   RDW 18.4* 03/08/2015 0505   LYMPHSABS 0.6* 03/05/2015 0630   MONOABS 0.2 03/05/2015 0630   EOSABS 0.1 03/05/2015 0630   BASOSABS 0.0 03/05/2015 0630   HEPATIC Function Panel  Recent Labs  03/16/14 1708 03/18/14 0632 03/05/15 0630  PROT 7.1 6.7 6.1*   HEMOGLOBIN A1C No components found for: HGA1C,  MPG CARDIAC ENZYMES Lab Results  Component Value Date   CKTOTAL 381* 01/20/2012   CKMB 9.1* 01/20/2012   TROPONINI 0.33* 03/06/2015   TROPONINI 0.34* 03/05/2015   TROPONINI 0.35* 03/05/2015   BNP  Recent Labs  03/16/14 1708  PROBNP 6646.0*   TSH  Recent Labs  03/16/14 2305  TSH 6.570*   CHOLESTEROL  Recent Labs  03/06/15 0818  CHOL 278*    Scheduled Meds: .  allopurinol  100 mg Oral Daily  . apixaban  2.5 mg Oral BID  . aspirin EC  81 mg Oral Daily  . atorvastatin  10 mg Oral QPM  . cefTRIAXone (ROCEPHIN)  IV  1 g Intravenous Q24H  . cycloSPORINE  1 drop Both Eyes BID  . digoxin  0.0625 mg Oral Daily  . diltiazem  30 mg Oral Q12H  . donepezil  5 mg Oral Daily  . furosemide  40 mg Oral Once  . insulin aspart  0-9 Units Subcutaneous TID WC  . levothyroxine  25 mcg Oral QAC breakfast  . oxybutynin  10 mg Oral Daily  . pantoprazole  40 mg Oral BID   Continuous Infusions:  PRN Meds:.acetaminophen, nitroGLYCERIN, ondansetron (ZOFRAN) IV, traMADol  Assessment/Plan: Chest pain r/o MI Atrial fibrillation with controlled ventricular response, CHADS2VASC score of 5/9. DM,  II Hypertension CAD CKD, II Hypothyroidism Hypothermia-resolved UTI  DC telemetry. Increase ambulation     LOS: 3 days    Dixie Dials  MD  03/08/2015, 11:02 AM

## 2015-03-08 NOTE — Clinical Social Work Placement (Signed)
   CLINICAL SOCIAL WORK PLACEMENT  NOTE  Date:  03/08/2015  Patient Details  Name: [name] [name] MRN: [id number] Date of Birth: [birth year]  Clinical Social Work is seeking post-discharge placement for this patient at the [place] level of care (*CSW will initial, date and re-position this form in  chart as items are completed):  Yes   Patient/family provided with [place] Work Department's list of facilities offering this level of care within the geographic area requested by the patient (or if unable, by the patient's family).  Yes   Patient/family informed of their freedom to choose among providers that offer the needed level of care, that participate in Medicare, Medicaid or managed care program needed by the patient, have an available bed and are willing to accept the patient.  Yes   Patient/family informed of [facility]'s ownership interest in [facility] and [facility], as well as of the fact that they are under no obligation to receive care at these facilities.  PASRR submitted to EDS on 03/08/15     PASRR number received on 03/08/15     Existing PASRR number confirmed on       FL2 transmitted to all facilities in geographic area requested by pt/family on 03/08/15     FL2 transmitted to all facilities within larger geographic area on       Patient informed that his/her managed care company has contracts with or will negotiate with certain facilities, including the following:            Patient/family informed of bed offers received.  Patient chooses bed at       Physician recommends and patient chooses bed at      Patient to be transferred to   on  .  Patient to be transferred to facility by       Patient family notified on   of transfer.  Name of family member notified:  Sonia Side, son     PHYSICIAN       Additional Comment:    _______________________________________________ Dulcy Fanny, LCSW 03/08/2015, 12:41  PM

## 2015-03-08 NOTE — Clinical Social Work Note (Signed)
Clinical Social Work Assessment  Patient Details  Name: [name] [name] MRN: [id number] Date of Birth: [birth year]  Date of referral:  03/08/15               Reason for consult:  Facility Placement                Permission sought to share information with:   (patient alert though not oriented) Permission granted to share information::  No (patient not oriented)  Name::        Agency::   [facility] SNFs)  Relationship::     Contact Information:   (Son will be making decisions for patient)  Housing/Transportation Living arrangements for the past 2 months:  Single Family Home Source of Information:  Adult Children Patient Interpreter Needed:  None Criminal Activity/Legal Involvement Pertinent to Current Situation/Hospitalization:  No - Comment as needed Significant Relationships:  Adult Children Lives with:  Adult Children (Son, Sonia Side and wife) Do you feel safe going back to the place where you live?  Yes Sonia Side and wife are comfortable taking the patient back home if need be.  The son's wife stays home ) Need for family participation in patient care:     Care giving concerns:  Patient fall risk.  Patient's son doesn't necessarily wish to "put" patient in "a nursing home"  CSW provided education regarding STR and ability for patient to return home.   Social Worker assessment / plan:  Patient's son is requesting a facility in [place]: [facility] or [facility].  CSW received permission to send referrals to [facility].    Employment status:  Retired Forensic scientist:  Programmer, applications (Marine scientist) PT Recommendations:  [place] / Referral to community resources:  [place]  Patient/Family's Response to care:  Son agreeable to SNF or to take patient back home as wife stays home to care for patient   Patient/Family's Understanding of and Emotional Response to Diagnosis, Current Treatment, and Prognosis:  Son has  difficulty with full scope of patient's PT/OT needs as well as her needs for ADLs  Emotional Assessment Appearance:  Appears stated age Attitude/Demeanor/Rapport:   (assessment completed with son) Affect (typically observed):    Orientation:  Oriented to Self Alcohol / Substance use:  Not Applicable Psych involvement (Current and /or in the community):  No (Comment)  Discharge Needs  Concerns to be addressed:  No discharge needs identified Readmission within the last 30 days:    Current discharge risk:  None Barriers to Discharge:  No Barriers Identified   Dulcy Fanny, LCSW 03/08/2015, 12:38 PM

## 2015-03-08 NOTE — Evaluation (Signed)
Physical Therapy Evaluation Patient Details Name: [name] [name] MRN: [id number] DOB: 05-Apr-1931 Today's Date: 03/08/2015   History of Present Illness  Patient is an 81 y/o female admitted with chest pain.  PMH positive for DM, HTN, CAD, diastolic dysfunction, renal insufficiency and goit.    Clinical Impression  Patient presents with decreased independence with mobility due to deficits listed in PT problem list.  She will benefit from skilled PT in the acute setting to allow return home with intermittent family assist.  Patient currently max assist level for transfers.  Feel her report of home situation not accurate.  If she has capable 24 hour assist, could go home with HHPT.    Follow Up Recommendations SNF    Equipment Recommendations  Other (comment) (TBA)    Recommendations for Other Services       Precautions / Restrictions Precautions Precautions: Fall      Mobility  Bed Mobility Overal bed mobility: Needs Assistance Bed Mobility: Supine to Sit;Sit to Supine     Supine to sit: Min assist;Mod assist Sit to supine: Mod assist   General bed mobility comments: to sit pt able to use rail to lift trunk, but had to guide legs to edge of bed and assist trunk upright; to supine assist for LE's in bed and cues for positioning; scooting to edge of bed mod assist  Transfers Overall transfer level: Needs assistance Equipment used: Rolling walker (2 wheeled) Transfers: Sit to/from Stand Sit to Stand: Max assist;From elevated surface         General transfer comment: performed x 2 with increased assist second attempt.  Patient given multiple verbal, tactile, instructional cues to push up from bed, but kept placing hands on walker, finally removed walker and assisted to stand lifting hips and blocking knees  Ambulation/Gait Ambulation/Gait assistance: Min assist;Mod assist Ambulation Distance (Feet): 10 Feet (5'fwd, 5' back) Assistive device: Rolling walker (2 wheeled) Gait  Pattern/deviations: Step-to pattern;Shuffle;Wide base of support;Decreased stride length     General Gait Details: pt fearful to go too far, required assist backing to keep walker close and min facilitation for weight shift for improved stepping, tendency to shuffle more on right than left with increased edema noted in right foot  Stairs            Wheelchair Mobility    Modified Rankin (Stroke Patients Only)       Balance Overall balance assessment: Needs assistance Sitting-balance support: Feet unsupported;Bilateral upper extremity supported Sitting balance-Leahy Scale: Fair Sitting balance - Comments: initially supported with UE's due to feet not on floor, then with feet supported able to sit without UE assist   Standing balance support: Bilateral upper extremity supported Standing balance-Leahy Scale: Poor Standing balance comment: assist to bring hips forward as pt bracing knees against bed initially in standing. needs UE support and assist for balance/safety                             Pertinent Vitals/Pain Pain Assessment: No/denies pain    Home Living Family/patient expects to be discharged to:: Private residence Living Arrangements: Children (son) Available Help at Discharge: Family;Available PRN/intermittently Type of Home: House Home Access: Level entry     Home Layout: One level Home Equipment: [name] [name] - quad;Walker - 2 wheels;Toilet riser;Grab bars - toilet;Grab bars - tub/shower Additional Comments: Most information taken from notes from admission 3 months ago;  Patient poor historian, but oriented x 4 (except states age  as 61, but did remember recent birthday)    Prior Function Level of Independence: Needs assistance         Comments: Patient poor historian; states was only getting occasional help with ADL/IADL tasks     Hand Dominance   Dominant Hand: Right    Extremity/Trunk Assessment               Lower Extremity  Assessment: Generalized weakness      Cervical / Trunk Assessment: Kyphotic  Communication   Communication: HOH  Cognition Arousal/Alertness: Awake/alert Behavior During Therapy: WFL for tasks assessed/performed Overall Cognitive Status: No family/caregiver present to determine baseline cognitive functioning                      General Comments General comments (skin integrity, edema, etc.): Pt extremely hard of hearing, noted left great toe with loose skin bubbled up from lower layer    Exercises        Assessment/Plan    PT Assessment Patient needs continued PT services  PT Diagnosis Generalized weakness;Difficulty walking   PT Problem List Decreased strength;Decreased activity tolerance;Decreased balance;Decreased mobility;Decreased safety awareness;Decreased knowledge of use of DME  PT Treatment Interventions DME instruction;Balance training;Gait training;Functional mobility training;Patient/family education;Therapeutic activities;Therapeutic exercise   PT Goals (Current goals can be found in the Care Plan section) Acute Rehab PT Goals Patient Stated Goal: To go home PT Goal Formulation: With patient Time For Goal Achievement: 03/22/15 Potential to Achieve Goals: Good    Frequency Min 3X/week   Barriers to discharge Decreased caregiver support son works and pt alone during the day per her report    Co-evaluation               End of Session Equipment Utilized During Treatment: Gait belt Activity Tolerance: Patient tolerated treatment well Patient left: in bed;with call bell/phone within reach;with bed alarm set           Time: 5809-9833 PT Time Calculation (min) (ACUTE ONLY): 32 min   Charges:   PT Evaluation $Initial PT Evaluation Tier I: 1 Procedure PT Treatments $Therapeutic Activity: 8-22 mins   PT G Codes:        [name] [name] Mar 14, 2015, 11:52 AM  Magda Kiel, Early 03/14/15

## 2015-03-09 LAB — GLUCOSE, CAPILLARY
GLUCOSE-CAPILLARY: 115 mg/dL — AB (ref 65–99)
GLUCOSE-CAPILLARY: 95 mg/dL (ref 65–99)
Glucose-Capillary: 151 mg/dL — ABNORMAL HIGH (ref 65–99)
Glucose-Capillary: 92 mg/dL (ref 65–99)

## 2015-03-09 LAB — BASIC METABOLIC PANEL
Anion gap: 11 (ref 5–15)
BUN: 31 mg/dL — ABNORMAL HIGH (ref 6–20)
CO2: 23 mmol/L (ref 22–32)
CREATININE: 1.78 mg/dL — AB (ref 0.44–1.00)
Calcium: 8.4 mg/dL — ABNORMAL LOW (ref 8.9–10.3)
Chloride: 101 mmol/L (ref 101–111)
GFR, EST AFRICAN AMERICAN: 29 mL/min — AB (ref >60)
GFR, EST NON AFRICAN AMERICAN: 25 mL/min — AB (ref >60)
Glucose, Bld: 84 mg/dL (ref 65–99)
POTASSIUM: 3.3 mmol/L — AB (ref 3.5–5.1)
Sodium: 135 mmol/L (ref 135–145)

## 2015-03-09 MED ORDER — LEVOTHYROXINE SODIUM 50 MCG PO TABS
50.0000 ug | ORAL_TABLET | Freq: Every day | ORAL | Status: DC
  Administered 2015-03-09: 50 ug via ORAL
  Filled 2015-03-09: qty 1

## 2015-03-09 MED ORDER — LEVOTHYROXINE SODIUM 75 MCG PO TABS
75.0000 ug | ORAL_TABLET | Freq: Every day | ORAL | Status: DC
  Administered 2015-03-10 – 2015-03-11 (×2): 75 ug via ORAL
  Filled 2015-03-09 (×2): qty 1

## 2015-03-09 MED ORDER — POTASSIUM CHLORIDE ER 10 MEQ PO TBCR
10.0000 meq | EXTENDED_RELEASE_TABLET | Freq: Three times a day (TID) | ORAL | Status: DC
  Administered 2015-03-09 (×2): 10 meq via ORAL
  Filled 2015-03-09 (×5): qty 1

## 2015-03-09 MED ORDER — POTASSIUM CHLORIDE ER 10 MEQ PO TBCR
20.0000 meq | EXTENDED_RELEASE_TABLET | Freq: Three times a day (TID) | ORAL | Status: AC
  Administered 2015-03-09: 20 meq via ORAL
  Filled 2015-03-09 (×2): qty 2

## 2015-03-09 NOTE — Progress Notes (Signed)
Ref: [name],[name], MD   Subjective:  Awake. Mild hypokalemia. Inadequate thyroid replacement.  Objective:  Vital Signs in the last 24 hours: Temp:  [97.6 F (36.4 C)] 97.6 F (36.4 C) (07/01 0639) Pulse Rate:  [50-68] 68 (07/01 1029) Cardiac Rhythm:  [-]  BP: (107-115)/(58-70) 115/62 mmHg (07/01 1029) SpO2:  [94 %-99 %] 94 % (07/01 0639) Weight:  [59.829 kg (131 lb 14.4 oz)] 59.829 kg (131 lb 14.4 oz) (06/30 2022)  Physical Exam: BP Readings from Last 1 Encounters:  03/09/15 115/62    Wt Readings from Last 1 Encounters:  03/08/15 59.829 kg (131 lb 14.4 oz)    Weight change: 0.816 kg (1 lb 12.8 oz)  HEENT: [place]/AT, Eyes-Brown, PERL, EOMI, Conjunctiva-Pink, Sclera-Non-icteric Neck: No JVD, No bruit, Trachea midline. Lungs:  Clear, Bilateral. Cardiac:  Regular rhythm, normal S1 and S2, no S3.  Abdomen:  Soft, non-tender. Extremities:  1+ edema present. No cyanosis. No clubbing. CNS: AxOx2, Cranial nerves grossly intact, moves all 4 extremities. Right handed. Skin: Warm and dry.   Intake/Output from previous day: 06/30 0701 - 07/01 0700 In: 50 [IV Piggyback:50] Out: -     Lab Results: BMET    Component Value Date/Time   NA 135 03/09/2015 0349   NA 133* 03/08/2015 0700   NA 134* 03/06/2015 0425   K 3.3* 03/09/2015 0349   K 3.7 03/08/2015 0700   K 3.7 03/06/2015 0425   CL 101 03/09/2015 0349   CL 97* 03/08/2015 0700   CL 105 03/06/2015 0425   CO2 23 03/09/2015 0349   CO2 20* 03/08/2015 0700   CO2 16* 03/06/2015 0425   GLUCOSE 84 03/09/2015 0349   GLUCOSE 150* 03/08/2015 0700   GLUCOSE 131* 03/06/2015 0425   BUN 31* 03/09/2015 0349   BUN 31* 03/08/2015 0700   BUN 40* 03/06/2015 0425   CREATININE 1.78* 03/09/2015 0349   CREATININE 1.92* 03/08/2015 0700   CREATININE 1.94* 03/06/2015 0425   CALCIUM 8.4* 03/09/2015 0349   CALCIUM 8.0* 03/08/2015 0700   CALCIUM 8.1* 03/06/2015 0425   GFRNONAA 25* 03/09/2015 0349   GFRNONAA 23* 03/08/2015 0700   GFRNONAA 23* 03/06/2015 0425   GFRAA 29* 03/09/2015 0349   GFRAA 27* 03/08/2015 0700   GFRAA 27* 03/06/2015 0425   CBC    Component Value Date/Time   WBC 6.0 03/08/2015 0505   RBC 4.94 03/08/2015 0505   HGB 15.1* 03/08/2015 0505   HCT 43.3 03/08/2015 0505   PLT 45* 03/08/2015 0505   MCV 87.7 03/08/2015 0505   MCH 30.6 03/08/2015 0505   MCHC 34.9 03/08/2015 0505   RDW 18.4* 03/08/2015 0505   LYMPHSABS 0.6* 03/05/2015 0630   MONOABS 0.2 03/05/2015 0630   EOSABS 0.1 03/05/2015 0630   BASOSABS 0.0 03/05/2015 0630   HEPATIC Function Panel  Recent Labs  03/16/14 1708 03/18/14 0632 03/05/15 0630  PROT 7.1 6.7 6.1*   HEMOGLOBIN A1C No components found for: HGA1C,  MPG CARDIAC ENZYMES Lab Results  Component Value Date   CKTOTAL 381* 01/20/2012   CKMB 9.1* 01/20/2012   TROPONINI 0.33* 03/06/2015   TROPONINI 0.34* 03/05/2015   TROPONINI 0.35* 03/05/2015   BNP  Recent Labs  03/16/14 1708  PROBNP 6646.0*   TSH  Recent Labs  03/16/14 2305 03/08/15 1213  TSH 6.570* 66.617*   CHOLESTEROL  Recent Labs  03/06/15 0818  CHOL 278*    Scheduled Meds: . allopurinol  100 mg Oral Daily  . apixaban  2.5 mg Oral BID  .  aspirin EC  81 mg Oral Daily  . atorvastatin  10 mg Oral QPM  . cefTRIAXone (ROCEPHIN)  IV  1 g Intravenous Q24H  . cycloSPORINE  1 drop Both Eyes BID  . digoxin  0.0625 mg Oral Daily  . diltiazem  30 mg Oral Q12H  . donepezil  5 mg Oral Daily  . insulin aspart  0-9 Units Subcutaneous TID WC  . levothyroxine  50 mcg Oral QAC breakfast  . oxybutynin  10 mg Oral Daily  . pantoprazole  40 mg Oral BID  . potassium chloride  20 mEq Oral TID   Continuous Infusions:  PRN Meds:.acetaminophen, nitroGLYCERIN, ondansetron (ZOFRAN) IV, traMADol  Assessment/Plan: Chest pain r/o MI Atrial fibrillation with controlled ventricular response, CHADS2VASC score of 5/9. DM, II Hypertension CAD CKD, II Hypothyroidism Hypothermia-resolved UTI  Adjust oral  thyroid dose. Increase potassium supplement.     LOS: 4 days    Dixie Dials  MD  03/09/2015, 6:22 PM

## 2015-03-09 NOTE — Care Management Note (Signed)
Case Management Note  Patient Details  Name: [name] [name] MRN: [id number] Date of Birth: 12-26-1930  Subjective/Objective:      Pt admitted for cp. Lives with son and daughter n law. Plan is to return home. Refusing SNF at this time. Per son pt has 24 hr supervision.               Action/Plan: CM did offer choice to pt/son and chose [facility] for services. CM did make referral for services and SOC to begin within 24-48 hrs post d/c. No further needs from CM at this time.    Expected Discharge Date:                  Expected Discharge Plan:  [place]  In-House Referral:  Clinical Social Work  Discharge planning Services  CM Consult  Post Acute Care Choice:  Home Health Choice offered to:  Adult Children  DME Arranged:  N/A DME Agency:  NA  HH Arranged:  RN, PT, Nurse's Aide [place] Agency:  [place]  Status of Service:  Completed, signed off  Medicare Important Message Given:  Yes-second notification given Date Medicare IM Given:    Medicare IM give by:    Date Additional Medicare IM Given:    Additional Medicare Important Message give by:     If discussed at [place] of Stay Meetings, dates discussed:    Additional Comments:  Bethena Roys, RN 03/09/2015, 11:07 AM

## 2015-03-10 LAB — BASIC METABOLIC PANEL WITH GFR
Anion gap: 16 — ABNORMAL HIGH (ref 5–15)
BUN: 31 mg/dL — ABNORMAL HIGH (ref 6–20)
CO2: 18 mmol/L — ABNORMAL LOW (ref 22–32)
Calcium: 8.1 mg/dL — ABNORMAL LOW (ref 8.9–10.3)
Chloride: 96 mmol/L — ABNORMAL LOW (ref 101–111)
Creatinine, Ser: 1.75 mg/dL — ABNORMAL HIGH (ref 0.44–1.00)
GFR calc Af Amer: 30 mL/min — ABNORMAL LOW
GFR calc non Af Amer: 26 mL/min — ABNORMAL LOW
Glucose, Bld: 83 mg/dL (ref 65–99)
Potassium: 4.1 mmol/L (ref 3.5–5.1)
Sodium: 130 mmol/L — ABNORMAL LOW (ref 135–145)

## 2015-03-10 LAB — GLUCOSE, CAPILLARY
GLUCOSE-CAPILLARY: 204 mg/dL — AB (ref 65–99)
GLUCOSE-CAPILLARY: 102 mg/dL — AB (ref 65–99)
Glucose-Capillary: 107 mg/dL — ABNORMAL HIGH (ref 65–99)
Glucose-Capillary: 99 mg/dL (ref 65–99)

## 2015-03-10 MED ORDER — CIPROFLOXACIN HCL 500 MG PO TABS
250.0000 mg | ORAL_TABLET | Freq: Two times a day (BID) | ORAL | Status: DC
  Administered 2015-03-10 – 2015-03-11 (×3): 250 mg via ORAL
  Filled 2015-03-10 (×3): qty 1

## 2015-03-10 NOTE — Progress Notes (Signed)
Pt attempted to urinate in bedside commode with no result. Subsequently bladder scanned with a result of 768. Immediately after pt had small amount of urination while in the chair. Bladder scanned again 30 min later with a result of 715. Pt in and out cathed with an output of 800. Pt tolerated well reporting relief. Wardell Honour, RN

## 2015-03-10 NOTE — Progress Notes (Signed)
Ref: [name],[name], MD   Subjective:  Awake. Hypokalemia resolving. Afebrile.  Objective:  Vital Signs in the last 24 hours: Temp:  [98 F (36.7 C)-98.4 F (36.9 C)] 98.2 F (36.8 C) (07/02 1420) Pulse Rate:  [46-72] 57 (07/02 1420) Cardiac Rhythm:  [-]  Resp:  [18-20] 20 (07/02 1420) BP: (97-110)/(43-60) 100/54 mmHg (07/02 1420) SpO2:  [96 %-100 %] 100 % (07/02 1420) FiO2 (%):  [2 %] 2 % (07/01 1840) Weight:  [60.782 kg (134 lb)] 60.782 kg (134 lb) (07/02 0419)  Physical Exam: BP Readings from Last 1 Encounters:  03/10/15 100/54    Wt Readings from Last 1 Encounters:  03/10/15 60.782 kg (134 lb)    Weight change: 0.953 kg (2 lb 1.6 oz)  HEENT: [place]/AT, Eyes-Brown, PERL, EOMI, Conjunctiva-Pink, Sclera-Non-icteric Neck: No JVD, No bruit, Trachea midline. Lungs:  Clear, Bilateral. Cardiac:  Regular rhythm, normal S1 and S2, no S3.  Abdomen:  Soft, non-tender. Extremities:  1 + edema present. No cyanosis. No clubbing. CNS: AxOx3, Cranial nerves grossly intact, moves all 4 extremities. Right handed. Skin: Warm and dry.   Intake/Output from previous day: 07/01 0701 - 07/02 0700 In: 240 [P.O.:240] Out: -     Lab Results: BMET    Component Value Date/Time   NA 130* 03/10/2015 0454   NA 135 03/09/2015 0349   NA 133* 03/08/2015 0700   K 4.1 03/10/2015 0454   K 3.3* 03/09/2015 0349   K 3.7 03/08/2015 0700   CL 96* 03/10/2015 0454   CL 101 03/09/2015 0349   CL 97* 03/08/2015 0700   CO2 18* 03/10/2015 0454   CO2 23 03/09/2015 0349   CO2 20* 03/08/2015 0700   GLUCOSE 83 03/10/2015 0454   GLUCOSE 84 03/09/2015 0349   GLUCOSE 150* 03/08/2015 0700   BUN 31* 03/10/2015 0454   BUN 31* 03/09/2015 0349   BUN 31* 03/08/2015 0700   CREATININE 1.75* 03/10/2015 0454   CREATININE 1.78* 03/09/2015 0349   CREATININE 1.92* 03/08/2015 0700   CALCIUM 8.1* 03/10/2015 0454   CALCIUM 8.4* 03/09/2015 0349   CALCIUM 8.0* 03/08/2015 0700   GFRNONAA 26* 03/10/2015 0454   GFRNONAA 25* 03/09/2015 0349   GFRNONAA 23* 03/08/2015 0700   GFRAA 30* 03/10/2015 0454   GFRAA 29* 03/09/2015 0349   GFRAA 27* 03/08/2015 0700   CBC    Component Value Date/Time   WBC 6.0 03/08/2015 0505   RBC 4.94 03/08/2015 0505   HGB 15.1* 03/08/2015 0505   HCT 43.3 03/08/2015 0505   PLT 45* 03/08/2015 0505   MCV 87.7 03/08/2015 0505   MCH 30.6 03/08/2015 0505   MCHC 34.9 03/08/2015 0505   RDW 18.4* 03/08/2015 0505   LYMPHSABS 0.6* 03/05/2015 0630   MONOABS 0.2 03/05/2015 0630   EOSABS 0.1 03/05/2015 0630   BASOSABS 0.0 03/05/2015 0630   HEPATIC Function Panel  Recent Labs  03/16/14 1708 03/18/14 0632 03/05/15 0630  PROT 7.1 6.7 6.1*   HEMOGLOBIN A1C No components found for: HGA1C,  MPG CARDIAC ENZYMES Lab Results  Component Value Date   CKTOTAL 381* 01/20/2012   CKMB 9.1* 01/20/2012   TROPONINI 0.33* 03/06/2015   TROPONINI 0.34* 03/05/2015   TROPONINI 0.35* 03/05/2015   BNP  Recent Labs  03/16/14 1708  PROBNP 6646.0*   TSH  Recent Labs  03/16/14 2305 03/08/15 1213  TSH 6.570* 66.617*   CHOLESTEROL  Recent Labs  03/06/15 0818  CHOL 278*    Scheduled Meds: . allopurinol  100 mg Oral  Daily  . apixaban  2.5 mg Oral BID  . aspirin EC  81 mg Oral Daily  . atorvastatin  10 mg Oral QPM  . ciprofloxacin  250 mg Oral BID  . cycloSPORINE  1 drop Both Eyes BID  . digoxin  0.0625 mg Oral Daily  . diltiazem  30 mg Oral Q12H  . donepezil  5 mg Oral Daily  . insulin aspart  0-9 Units Subcutaneous TID WC  . levothyroxine  75 mcg Oral QAC breakfast  . oxybutynin  10 mg Oral Daily  . pantoprazole  40 mg Oral BID   Continuous Infusions:  PRN Meds:.acetaminophen, nitroGLYCERIN, ondansetron (ZOFRAN) IV, traMADol  Assessment/Plan: Chest pain  Atrial fibrillation with controlled ventricular response, CHADS2VASC score of 5/9. DM, II Hypertension CAD CKD, II Hypothyroidism Hypothermia-resolved UTI  Change to oral antibiotic. Increase  activity.     LOS: 5 days    Dixie Dials  MD  03/10/2015, 3:16 PM

## 2015-03-11 LAB — GLUCOSE, CAPILLARY: Glucose-Capillary: 88 mg/dL (ref 65–99)

## 2015-03-11 MED ORDER — CIPROFLOXACIN HCL 250 MG PO TABS: 250.0000 mg | ORAL_TABLET | Freq: Two times a day (BID) | ORAL | Status: DC

## 2015-03-11 MED ORDER — NITROGLYCERIN 0.4 MG SL SUBL: 0.4000 mg | SUBLINGUAL_TABLET | SUBLINGUAL | Status: DC | PRN

## 2015-03-11 MED ORDER — LEVOTHYROXINE SODIUM 75 MCG PO TABS: 75.0000 ug | ORAL_TABLET | Freq: Every day | ORAL | Status: AC

## 2015-03-11 MED ORDER — ATORVASTATIN CALCIUM 10 MG PO TABS: 10.0000 mg | ORAL_TABLET | Freq: Every evening | ORAL | Status: AC

## 2015-03-11 NOTE — Discharge Summary (Signed)
Physician Discharge Summary  Patient ID: [name] [name] MRN: [id number] DOB/AGE: [birth year] 79 y.o.  Admit date: 03/05/2015 Discharge date: 03/11/2015  Admission Diagnoses: Chest pain r/o MI Atrial fibrillation with controlled ventricular response, CHADS2VASC score of 5/9. DM, II Hypertension CAD CKD, II Hypothyroidism Hypothermia-resolved UTI  Discharge Diagnoses:  Principle Problems: * Acute coronary syndrome * Acute on chronic systolic heart failure Altered mental status UTI Atrial fibrillation with controlled ventricular response, CHADS2VASC score of 5/9. DM, II Hypertension CAD CKD, II Hypothyroidism Hypothermia-resolved   Discharged Condition: fair  Hospital Course: 81 year old black female requested EMS because of chest pain. EMS reports that she had been complaining of chest pain but became unresponsive as they were taking her to the ambulance and she had remained unresponsive. She does have history of coronary artery disease. Her chest x-ray shows pulmonary edema and bilateral effusions. She was transferred for further care. She had fluid bolus post drop in blood pressure with NTG use. Her troponin-I levels were 0.35 ng and BNP was elevated. She was treated with IV antibiotics followed by oral antibiotic. Her thyroid dose was increased. Her oxybutynin was discontinued for urinary retension. She will be followed by primary care in 1 week and by me in 1 month.  Consults: cardiology  Significant Diagnostic Studies: labs: CBC near normal. Electrolytes normal. BUN/Cr elevated at 42/1.9. Troponin-I was 0.35 and BNP  was 571. Blood sugar was 42 to 56 on day of admission. Lactic acid was 2.51 Urine was positive for Leukocytes and Nitrite. [phone]  Treatments: cardiac meds: diltiazem, digoxin and Eliquis.  Discharge Exam: Blood pressure 90/50, pulse 103, temperature 98 F (36.7 C), temperature source Axillary, resp. rate 16, height 5\' 2"  (1.575 m), weight 57.743 kg (127 lb  4.8 oz), SpO2 100 %. HEENT: [place]/AT, Eyes-Brown, PERL, EOMI, Conjunctiva-Pink, Sclera-Non-icteric Neck: No JVD, No bruit, Trachea midline. Lungs: Clear, Bilateral. Cardiac: Regular rhythm, normal S1 and S2, no S3. II/VI systolic murmur. Abdomen: Soft, non-tender. Extremities: 1 + edema present. No cyanosis. No clubbing. CNS: AxOx3, Cranial nerves grossly intact, moves all 4 extremities. Right handed. Skin: Warm and dry.  Disposition: 01-Home or Self Care     Medication List    STOP taking these medications        lactulose 10 GM/15ML solution  Commonly known as:  CHRONULAC     metoprolol succinate 25 MG 24 hr tablet  Commonly known as:  TOPROL-XL     simvastatin 20 MG tablet  Commonly known as:  ZOCOR  Replaced by:  atorvastatin 10 MG tablet      TAKE these medications        allopurinol 100 MG tablet  Commonly known as:  ZYLOPRIM  Take 100 mg by mouth daily.     apixaban 2.5 MG Tabs tablet  Commonly known as:  ELIQUIS  Take 1 tablet (2.5 mg total) by mouth 2 (two) times daily.     atorvastatin 10 MG tablet  Commonly known as:  LIPITOR  Take 1 tablet (10 mg total) by mouth every evening.     ciprofloxacin 250 MG tablet  Commonly known as:  CIPRO  Take 1 tablet (250 mg total) by mouth 2 (two) times daily.     colchicine 0.6 MG tablet  Take 0.5 tablets (0.3 mg total) by mouth daily.     cycloSPORINE 0.05 % ophthalmic emulsion  Commonly known as:  RESTASIS  Place 1 drop into both eyes 2 (two) times daily.     digoxin 0.125 MG tablet  Commonly known as:  LANOXIN  Take 0.5 tablets (0.0625 mg total) by mouth daily.     diltiazem 30 MG tablet  Commonly known as:  CARDIZEM  Take 1 tablet (30 mg total) by mouth every 12 (twelve) hours.     donepezil 5 MG tablet  Commonly known as:  ARICEPT  Take 5 mg by mouth daily.     levothyroxine 75 MCG tablet  Commonly known as:  SYNTHROID, LEVOTHROID  Take 1 tablet (75 mcg total) by mouth daily before breakfast.      nitroGLYCERIN 0.4 MG SL tablet  Commonly known as:  NITROSTAT  Place 1 tablet (0.4 mg total) under the tongue every 5 (five) minutes x 3 doses as needed for chest pain.     oxybutynin 10 MG 24 hr tablet  Commonly known as:  DITROPAN-XL  Take 10 mg by mouth daily.     pantoprazole 40 MG tablet  Commonly known as:  PROTONIX  Take 40 mg by mouth 2 (two) times daily.     traMADol 50 MG tablet  Commonly known as:  ULTRAM  Take 50 mg by mouth 3 (three) times daily as needed. pain     Vitamin D (Ergocalciferol) 50000 UNITS Caps capsule  Commonly known as:  DRISDOL  Take 50,000 Units by mouth every 7 (seven) days.           Follow-up Information    Follow up with [facility].   Why:  Registered Nurse, Physical Therapy, Aide.    Contact information:   [address] [place] [address] [phone]       Follow up with Leola Brazil, MD. Schedule an appointment as soon as possible for a visit in 1 week.   Specialty:  Pulmonary Disease   Contact information:   [place] [address] [phone]       Follow up with [facility], MD. Schedule an appointment as soon as possible for a visit in 1 month.   Specialty:  Cardiology   Contact information:   [place] [address] [phone]       Signed: Birdie Riddle 03/11/2015, 10:02 AM

## 2015-03-11 NOTE — Progress Notes (Signed)
Pt has not voided and has no urge to void at this time. Bladder scan at this time reveals 153cc of urine in bladder. Will continue to monitor. [facility] [employer]

## 2015-03-11 NOTE — Progress Notes (Signed)
Patient has not voided since in and out cath, patient states she does not feel the urge to void at this time. Bladder scan done and 143cc in bladder. Will continue to monitor. [facility] [employer]

## 2015-04-03 ENCOUNTER — Encounter (HOSPITAL_COMMUNITY): Payer: Self-pay | Admitting: *Deleted

## 2015-04-03 ENCOUNTER — Observation Stay (HOSPITAL_COMMUNITY): Payer: Medicare Other

## 2015-04-03 ENCOUNTER — Emergency Department (HOSPITAL_COMMUNITY): Payer: Medicare Other

## 2015-04-03 ENCOUNTER — Inpatient Hospital Stay (HOSPITAL_COMMUNITY)
Admission: EM | Admit: 2015-04-03 | Discharge: 2015-04-09 | DRG: 871 | Disposition: E | Payer: Medicare Other | Attending: Cardiovascular Disease | Admitting: Cardiovascular Disease

## 2015-04-03 DIAGNOSIS — E039 Hypothyroidism, unspecified: Secondary | ICD-10-CM | POA: Diagnosis present

## 2015-04-03 DIAGNOSIS — T68XXXD Hypothermia, subsequent encounter: Secondary | ICD-10-CM | POA: Diagnosis not present

## 2015-04-03 DIAGNOSIS — I248 Other forms of acute ischemic heart disease: Secondary | ICD-10-CM | POA: Diagnosis present

## 2015-04-03 DIAGNOSIS — I2510 Atherosclerotic heart disease of native coronary artery without angina pectoris: Secondary | ICD-10-CM | POA: Diagnosis present

## 2015-04-03 DIAGNOSIS — N289 Disorder of kidney and ureter, unspecified: Secondary | ICD-10-CM

## 2015-04-03 DIAGNOSIS — G9340 Encephalopathy, unspecified: Secondary | ICD-10-CM | POA: Diagnosis not present

## 2015-04-03 DIAGNOSIS — I482 Chronic atrial fibrillation, unspecified: Secondary | ICD-10-CM

## 2015-04-03 DIAGNOSIS — E785 Hyperlipidemia, unspecified: Secondary | ICD-10-CM

## 2015-04-03 DIAGNOSIS — R5381 Other malaise: Secondary | ICD-10-CM

## 2015-04-03 DIAGNOSIS — R531 Weakness: Secondary | ICD-10-CM | POA: Diagnosis not present

## 2015-04-03 DIAGNOSIS — T68XXXA Hypothermia, initial encounter: Secondary | ICD-10-CM | POA: Insufficient documentation

## 2015-04-03 DIAGNOSIS — Z66 Do not resuscitate: Secondary | ICD-10-CM | POA: Diagnosis present

## 2015-04-03 DIAGNOSIS — J95811 Postprocedural pneumothorax: Secondary | ICD-10-CM

## 2015-04-03 DIAGNOSIS — R778 Other specified abnormalities of plasma proteins: Secondary | ICD-10-CM

## 2015-04-03 DIAGNOSIS — I959 Hypotension, unspecified: Secondary | ICD-10-CM

## 2015-04-03 DIAGNOSIS — Z515 Encounter for palliative care: Secondary | ICD-10-CM

## 2015-04-03 DIAGNOSIS — E43 Unspecified severe protein-calorie malnutrition: Secondary | ICD-10-CM | POA: Diagnosis present

## 2015-04-03 DIAGNOSIS — I129 Hypertensive chronic kidney disease with stage 1 through stage 4 chronic kidney disease, or unspecified chronic kidney disease: Secondary | ICD-10-CM | POA: Diagnosis present

## 2015-04-03 DIAGNOSIS — A419 Sepsis, unspecified organism: Principal | ICD-10-CM | POA: Diagnosis present

## 2015-04-03 DIAGNOSIS — L89151 Pressure ulcer of sacral region, stage 1: Secondary | ICD-10-CM | POA: Diagnosis present

## 2015-04-03 DIAGNOSIS — I5023 Acute on chronic systolic (congestive) heart failure: Secondary | ICD-10-CM | POA: Diagnosis not present

## 2015-04-03 DIAGNOSIS — N182 Chronic kidney disease, stage 2 (mild): Secondary | ICD-10-CM | POA: Diagnosis present

## 2015-04-03 DIAGNOSIS — Z6823 Body mass index (BMI) 23.0-23.9, adult: Secondary | ICD-10-CM

## 2015-04-03 DIAGNOSIS — I501 Left ventricular failure: Secondary | ICD-10-CM | POA: Diagnosis present

## 2015-04-03 DIAGNOSIS — Z7901 Long term (current) use of anticoagulants: Secondary | ICD-10-CM

## 2015-04-03 DIAGNOSIS — I513 Intracardiac thrombosis, not elsewhere classified: Secondary | ICD-10-CM | POA: Diagnosis present

## 2015-04-03 DIAGNOSIS — R7989 Other specified abnormal findings of blood chemistry: Secondary | ICD-10-CM

## 2015-04-03 DIAGNOSIS — K219 Gastro-esophageal reflux disease without esophagitis: Secondary | ICD-10-CM | POA: Diagnosis present

## 2015-04-03 DIAGNOSIS — I509 Heart failure, unspecified: Secondary | ICD-10-CM

## 2015-04-03 DIAGNOSIS — L89612 Pressure ulcer of right heel, stage 2: Secondary | ICD-10-CM | POA: Diagnosis present

## 2015-04-03 DIAGNOSIS — M109 Gout, unspecified: Secondary | ICD-10-CM | POA: Diagnosis present

## 2015-04-03 DIAGNOSIS — F0390 Unspecified dementia without behavioral disturbance: Secondary | ICD-10-CM | POA: Diagnosis present

## 2015-04-03 DIAGNOSIS — L97509 Non-pressure chronic ulcer of other part of unspecified foot with unspecified severity: Secondary | ICD-10-CM

## 2015-04-03 DIAGNOSIS — E872 Acidosis: Secondary | ICD-10-CM | POA: Diagnosis present

## 2015-04-03 DIAGNOSIS — Z79899 Other long term (current) drug therapy: Secondary | ICD-10-CM

## 2015-04-03 DIAGNOSIS — L97519 Non-pressure chronic ulcer of other part of right foot with unspecified severity: Secondary | ICD-10-CM

## 2015-04-03 DIAGNOSIS — E11649 Type 2 diabetes mellitus with hypoglycemia without coma: Secondary | ICD-10-CM | POA: Diagnosis present

## 2015-04-03 DIAGNOSIS — T460X5A Adverse effect of cardiac-stimulant glycosides and drugs of similar action, initial encounter: Secondary | ICD-10-CM | POA: Diagnosis not present

## 2015-04-03 DIAGNOSIS — Z79891 Long term (current) use of opiate analgesic: Secondary | ICD-10-CM

## 2015-04-03 DIAGNOSIS — L89111 Pressure ulcer of right upper back, stage 1: Secondary | ICD-10-CM | POA: Diagnosis present

## 2015-04-03 HISTORY — DX: Chronic kidney disease, stage 2 (mild): N18.2

## 2015-04-03 HISTORY — DX: Chronic atrial fibrillation, unspecified: I48.20

## 2015-04-03 LAB — URINALYSIS, ROUTINE W REFLEX MICROSCOPIC
GLUCOSE, UA: 100 mg/dL — AB
HGB URINE DIPSTICK: NEGATIVE
Leukocytes, UA: NEGATIVE
Nitrite: NEGATIVE
PH: 5 (ref 5.0–8.0)
Protein, ur: NEGATIVE mg/dL
SPECIFIC GRAVITY, URINE: 1.025 (ref 1.005–1.030)
UROBILINOGEN UA: 1 mg/dL (ref 0.0–1.0)

## 2015-04-03 LAB — BLOOD GAS, ARTERIAL
Acid-base deficit: 8.2 mmol/L — ABNORMAL HIGH (ref 0.0–2.0)
Bicarbonate: 15.6 mEq/L — ABNORMAL LOW (ref 20.0–24.0)
DRAWN BY: 382351
FIO2: 0.28
O2 Saturation: 95.1 %
PCO2 ART: 25.2 mmHg — AB (ref 35.0–45.0)
PH ART: 7.407 (ref 7.350–7.450)
PO2 ART: 77.4 mmHg — AB (ref 80.0–100.0)
Patient temperature: 37
TCO2: 14.2 mmol/L (ref 0–100)

## 2015-04-03 LAB — COMPREHENSIVE METABOLIC PANEL
ALBUMIN: 2.4 g/dL — AB (ref 3.5–5.0)
ALK PHOS: 87 U/L (ref 38–126)
ALT: 18 U/L (ref 14–54)
AST: 28 U/L (ref 15–41)
Anion gap: 13 (ref 5–15)
BILIRUBIN TOTAL: 1.5 mg/dL — AB (ref 0.3–1.2)
BUN: 25 mg/dL — AB (ref 6–20)
CO2: 19 mmol/L — ABNORMAL LOW (ref 22–32)
Calcium: 8.2 mg/dL — ABNORMAL LOW (ref 8.9–10.3)
Chloride: 101 mmol/L (ref 101–111)
Creatinine, Ser: 2.08 mg/dL — ABNORMAL HIGH (ref 0.44–1.00)
GFR calc Af Amer: 24 mL/min — ABNORMAL LOW (ref >60)
GFR, EST NON AFRICAN AMERICAN: 21 mL/min — AB (ref >60)
GLUCOSE: 124 mg/dL — AB (ref 65–99)
Potassium: 3.7 mmol/L (ref 3.5–5.1)
Sodium: 133 mmol/L — ABNORMAL LOW (ref 135–145)
Total Protein: 5 g/dL — ABNORMAL LOW (ref 6.5–8.1)

## 2015-04-03 LAB — CBC WITH DIFFERENTIAL/PLATELET
Basophils Absolute: 0 10*3/uL (ref 0.0–0.1)
Basophils Relative: 0 % (ref 0–1)
Eosinophils Absolute: 0.1 10*3/uL (ref 0.0–0.7)
Eosinophils Relative: 2 % (ref 0–5)
HCT: 32.6 % — ABNORMAL LOW (ref 36.0–46.0)
Hemoglobin: 11.1 g/dL — ABNORMAL LOW (ref 12.0–15.0)
LYMPHS PCT: 11 % — AB (ref 12–46)
Lymphs Abs: 0.6 10*3/uL — ABNORMAL LOW (ref 0.7–4.0)
MCH: 29.6 pg (ref 26.0–34.0)
MCHC: 34 g/dL (ref 30.0–36.0)
MCV: 86.9 fL (ref 78.0–100.0)
Monocytes Absolute: 0.4 10*3/uL (ref 0.1–1.0)
Monocytes Relative: 9 % (ref 3–12)
NEUTROS ABS: 3.9 10*3/uL (ref 1.7–7.7)
Neutrophils Relative %: 78 % — ABNORMAL HIGH (ref 43–77)
Platelets: 151 10*3/uL (ref 150–400)
RBC: 3.75 MIL/uL — AB (ref 3.87–5.11)
RDW: 18.9 % — ABNORMAL HIGH (ref 11.5–15.5)
WBC: 5 10*3/uL (ref 4.0–10.5)

## 2015-04-03 LAB — LACTIC ACID, PLASMA
Lactic Acid, Venous: 2.5 mmol/L (ref 0.5–2.0)
Lactic Acid, Venous: 2 mmol/L (ref 0.5–2.0)

## 2015-04-03 LAB — TROPONIN I: TROPONIN I: 0.57 ng/mL — AB (ref <0.031)

## 2015-04-03 LAB — BRAIN NATRIURETIC PEPTIDE: B NATRIURETIC PEPTIDE 5: 656 pg/mL — AB (ref 0.0–100.0)

## 2015-04-03 MED ORDER — CEFTAZIDIME 2 G IJ SOLR
2.0000 g | Freq: Three times a day (TID) | INTRAMUSCULAR | Status: DC
  Administered 2015-04-04 (×2): 2 g via INTRAVENOUS
  Filled 2015-04-03 (×2): qty 2

## 2015-04-03 MED ORDER — ACETAMINOPHEN 650 MG RE SUPP: 650.0000 mg | Freq: Four times a day (QID) | RECTAL | Status: DC | PRN

## 2015-04-03 MED ORDER — SODIUM CHLORIDE 0.9 % IJ SOLN
3.0000 mL | Freq: Two times a day (BID) | INTRAMUSCULAR | Status: DC
  Administered 2015-04-04 – 2015-04-07 (×6): 3 mL via INTRAVENOUS

## 2015-04-03 MED ORDER — LEVOTHYROXINE SODIUM 75 MCG PO TABS: 75.0000 ug | ORAL_TABLET | Freq: Every day | ORAL | Status: DC

## 2015-04-03 MED ORDER — DIGOXIN 0.0625 MG HALF TABLET
0.0625 mg | ORAL_TABLET | Freq: Every day | ORAL | Status: DC
  Administered 2015-04-04: 0.0625 mg via ORAL
  Filled 2015-04-03 (×2): qty 1

## 2015-04-03 MED ORDER — ALLOPURINOL 100 MG PO TABS
100.0000 mg | ORAL_TABLET | Freq: Every day | ORAL | Status: DC | PRN
  Filled 2015-04-03: qty 1

## 2015-04-03 MED ORDER — ONDANSETRON HCL 4 MG/2ML IJ SOLN
4.0000 mg | Freq: Four times a day (QID) | INTRAMUSCULAR | Status: DC | PRN
  Administered 2015-04-05: 4 mg via INTRAVENOUS
  Filled 2015-04-03: qty 2

## 2015-04-03 MED ORDER — SILVER SULFADIAZINE 1 % EX CREA
TOPICAL_CREAM | Freq: Once | CUTANEOUS | Status: AC
  Administered 2015-04-03: 1 via TOPICAL
  Filled 2015-04-03: qty 50

## 2015-04-03 MED ORDER — ATORVASTATIN CALCIUM 10 MG PO TABS
10.0000 mg | ORAL_TABLET | Freq: Every evening | ORAL | Status: DC
Start: 2015-04-03 — End: 2015-04-08
  Administered 2015-04-04: 10 mg via ORAL
  Filled 2015-04-03 (×3): qty 1

## 2015-04-03 MED ORDER — CYCLOSPORINE 0.05 % OP EMUL
1.0000 [drp] | Freq: Two times a day (BID) | OPHTHALMIC | Status: DC
Start: 2015-04-03 — End: 2015-04-08
  Administered 2015-04-04 – 2015-04-06 (×5): 1 [drp] via OPHTHALMIC
  Filled 2015-04-03 (×12): qty 1

## 2015-04-03 MED ORDER — DILTIAZEM HCL 30 MG PO TABS
30.0000 mg | ORAL_TABLET | Freq: Two times a day (BID) | ORAL | Status: DC
  Administered 2015-04-06 (×2): 30 mg via ORAL
  Filled 2015-04-03 (×6): qty 1

## 2015-04-03 MED ORDER — ONDANSETRON HCL 4 MG PO TABS: 4.0000 mg | ORAL_TABLET | Freq: Four times a day (QID) | ORAL | Status: DC | PRN

## 2015-04-03 MED ORDER — SODIUM CHLORIDE 0.9 % IV BOLUS (SEPSIS)
250.0000 mL | Freq: Once | INTRAVENOUS | Status: AC
  Administered 2015-04-03: 250 mL via INTRAVENOUS

## 2015-04-03 MED ORDER — APIXABAN 2.5 MG PO TABS
2.5000 mg | ORAL_TABLET | Freq: Two times a day (BID) | ORAL | Status: DC
Start: 2015-04-03 — End: 2015-04-05
  Administered 2015-04-04 (×2): 2.5 mg via ORAL
  Filled 2015-04-03 (×6): qty 1

## 2015-04-03 MED ORDER — SODIUM CHLORIDE 0.9 % IV BOLUS (SEPSIS)
500.0000 mL | Freq: Once | INTRAVENOUS | Status: AC
  Administered 2015-04-03: 500 mL via INTRAVENOUS

## 2015-04-03 MED ORDER — FUROSEMIDE 10 MG/ML IJ SOLN
10.0000 mg | Freq: Every day | INTRAMUSCULAR | Status: DC
  Administered 2015-04-04 (×2): 10 mg via INTRAVENOUS
  Filled 2015-04-03 (×2): qty 2

## 2015-04-03 MED ORDER — VANCOMYCIN HCL IN DEXTROSE 1-5 GM/200ML-% IV SOLN
1000.0000 mg | Freq: Once | INTRAVENOUS | Status: AC
  Administered 2015-04-04: 1000 mg via INTRAVENOUS
  Filled 2015-04-03: qty 200

## 2015-04-03 MED ORDER — HEPARIN SODIUM (PORCINE) 5000 UNIT/ML IJ SOLN: 5000.0000 [IU] | Freq: Three times a day (TID) | INTRAMUSCULAR | Status: DC

## 2015-04-03 MED ORDER — ACETAMINOPHEN 325 MG PO TABS
650.0000 mg | ORAL_TABLET | Freq: Four times a day (QID) | ORAL | Status: DC | PRN
Start: 2015-04-03 — End: 2015-04-08
  Administered 2015-04-04: 650 mg via ORAL
  Filled 2015-04-03: qty 2

## 2015-04-03 MED ORDER — DONEPEZIL HCL 5 MG PO TABS
5.0000 mg | ORAL_TABLET | Freq: Every day | ORAL | Status: DC
  Administered 2015-04-04 – 2015-04-06 (×2): 5 mg via ORAL
  Filled 2015-04-03 (×4): qty 1

## 2015-04-03 MED ORDER — PANTOPRAZOLE SODIUM 40 MG PO TBEC
40.0000 mg | DELAYED_RELEASE_TABLET | Freq: Two times a day (BID) | ORAL | Status: DC
Start: 2015-04-03 — End: 2015-04-05
  Administered 2015-04-04 (×2): 40 mg via ORAL
  Filled 2015-04-03 (×3): qty 1

## 2015-04-03 MED ORDER — SODIUM CHLORIDE 0.9 % IV SOLN
INTRAVENOUS | Status: DC
  Administered 2015-04-07 (×2): via INTRAVENOUS

## 2015-04-03 MED ORDER — SODIUM CHLORIDE 0.9 % IV SOLN
INTRAVENOUS | Status: AC
  Administered 2015-04-03: 23:00:00 via INTRAVENOUS

## 2015-04-03 NOTE — ED Notes (Signed)
MD at bedside. 

## 2015-04-03 NOTE — H&P (Signed)
[facility] History and Physical  [name] [name] UVO:[id number] DOB: 1931-05-26 DOA: 03/16/2015  Referring physician: Dr Thurnell Garbe - [facility] PCP: Leola Brazil, MD   Chief Complaint: Weak, low blood pressure   HPI: [name] [name] is a 81 y.o. female  Level V caveat: History provided by ED physician and patient's son as patient is unable to give reliable history at this point time. At baseline patient is conversive and without evidence of dementia.  Of note patient was discharged from the hospital partially one month ago after admission for acute coronary syndrome and acute on chronic systolic congestive heart failure and CHF. Since that time patient has lived at home with her son and said daughter-in-law and has had home health aide including physical therapy. Today home health aide noticed patient be particularly weak, poorly communicative, unable to stand, and had a low blood pressure. Problem is constant and getting worse. Nothing relieves or worsens her symptoms. Per the son patient has had little oral intake over the last several days. Patient developed a right heel and right great toe ulcer during her last admission which is being treated by her home health aide.   Review of Systems:   No further review of systems of present illness provider due to patient's altered mental state.  Past Medical History  Diagnosis Date  . Diabetes mellitus   . Hypertension   . Clostridium difficile colitis 01/2013  . Hiatal hernia   . Hypothyroid   . GERD (gastroesophageal reflux disease)   . Coronary artery disease   . Diverticulitis   . Uterine fibroid   . Shingles   . Diastolic dysfunction     EF 35-40%  . Gout   . Chronic atrial fibrillation   . Renal insufficiency   . Left renal artery stenosis   . CKD (chronic kidney disease), stage II 02/2015   Past Surgical History  Procedure Laterality Date  . Renal artery stent Left   . Left heart catheterization with coronary  angiogram N/A 11/30/2014    Procedure: LEFT HEART CATHETERIZATION WITH CORONARY ANGIOGRAM;  Surgeon: Dixie Dials, MD;  Location: [facility] CATH [facility];  Service: Cardiovascular;  Laterality: N/A;  . Cardiac catheterization     Social History:  reports that she has never smoked. She does not have any smokeless tobacco history on file. She reports that she does not drink alcohol or use illicit drugs.  No Known Allergies  Family History  Problem Relation Age of Onset  . Family history unknown: Yes     Prior to Admission medications   Medication Sig Start Date End Date Taking? Authorizing Provider  allopurinol (ZYLOPRIM) 100 MG tablet Take 100 mg by mouth daily as needed (for gout).    Yes Historical Provider, MD  apixaban (ELIQUIS) 2.5 MG TABS tablet Take 1 tablet (2.5 mg total) by mouth 2 (two) times daily. 12/03/14  Yes Dixie Dials, MD  atorvastatin (LIPITOR) 10 MG tablet Take 1 tablet (10 mg total) by mouth every evening. 03/11/15  Yes Dixie Dials, MD  colchicine 0.6 MG tablet Take 0.5 tablets (0.3 mg total) by mouth daily. Patient taking differently: Take 0.3 mg by mouth daily as needed (for gout).  12/03/14  Yes Dixie Dials, MD  cycloSPORINE (RESTASIS) 0.05 % ophthalmic emulsion Place 1 drop into both eyes 2 (two) times daily.   Yes Historical Provider, MD  digoxin (LANOXIN) 0.125 MG tablet Take 0.5 tablets (0.0625 mg total) by mouth daily. 12/03/14  Yes Dixie Dials, MD  diltiazem (CARDIZEM) 30  MG tablet Take 1 tablet (30 mg total) by mouth every 12 (twelve) hours. 12/03/14  Yes Dixie Dials, MD  donepezil (ARICEPT) 5 MG tablet Take 5 mg by mouth daily. 05/17/13  Yes Historical Provider, MD  lactulose (CHRONULAC) 10 GM/15ML solution Take 20 g by mouth 2 (two) times daily as needed for mild constipation.   Yes Historical Provider, MD  levothyroxine (SYNTHROID, LEVOTHROID) 75 MCG tablet Take 1 tablet (75 mcg total) by mouth daily before breakfast. 03/11/15  Yes Dixie Dials, MD  nitroGLYCERIN (NITROSTAT)  0.4 MG SL tablet Place 1 tablet (0.4 mg total) under the tongue every 5 (five) minutes x 3 doses as needed for chest pain. 03/11/15  Yes Dixie Dials, MD  pantoprazole (PROTONIX) 40 MG tablet Take 40 mg by mouth 2 (two) times daily. 06/02/13  Yes Historical Provider, MD  traMADol (ULTRAM) 50 MG tablet Take 50 mg by mouth 3 (three) times daily as needed. pain 11/16/14  Yes Historical Provider, MD  Vitamin D, Ergocalciferol, (DRISDOL) 50000 UNITS CAPS capsule Take 50,000 Units by mouth every 7 (seven) days.   Yes Historical Provider, MD  ciprofloxacin (CIPRO) 250 MG tablet Take 1 tablet (250 mg total) by mouth 2 (two) times daily. Patient not taking: Reported on 03/15/2015 03/11/15   Dixie Dials, MD   Physical Exam: Filed Vitals:   04/04/2015 2130 04/02/2015 2200 04/05/2015 2230 03/23/2015 2245  BP: 95/49 95/55 107/64 107/61  Pulse:  72 75 78  Temp: 96.1 F (35.6 C) 96.8 F (36 C) 97.4 F (36.3 C) 97.7 F (36.5 C)  TempSrc:      Resp: 17 18 20 20   Height:      Weight:      SpO2:  98% 98% 99%    Wt Readings from Last 3 Encounters:  04/05/2015 54.432 kg (120 lb)  03/11/15 57.743 kg (127 lb 4.8 oz)  11/29/14 56.7 kg (125 lb)    General: frail and ill-appearing ENT: dry mucous membranes Neck:  no LAD, masses or thyromegaly Cardiovascular:  RRR, no m/r/g. No LE edema. Respiratory: decrease breath sounds bilaterally in the bases associated with crackles and rhonchi. Mildly increased effort, on 2 L nasal cannula Abdomen:  soft, ntnd Skin: right great toe skin tear along the dorsal surface and right heel skin ulceration, no rash. Musculoskeletal: globally weak tone but moves all extremities. Psychiatric: patient does not follow basic commands and is noncommunicative Neurologic: moves all extremities and coordinated fashion. Unable to assess further due to altered mental state.          Labs on Admission:  Basic Metabolic Panel:  Recent Labs Lab 03/09/2015 1859  NA 133*  K 3.7  CL 101  CO2 19*   GLUCOSE 124*  BUN 25*  CREATININE 2.08*  CALCIUM 8.2*   Liver Function Tests:  Recent Labs Lab 04/05/2015 1859  AST 28  ALT 18  ALKPHOS 87  BILITOT 1.5*  PROT 5.0*  ALBUMIN 2.4*   No results for input(s): LIPASE, AMYLASE in the last 168 hours. No results for input(s): AMMONIA in the last 168 hours. CBC:  Recent Labs Lab 04/04/2015 1859  WBC 5.0  NEUTROABS 3.9  HGB 11.1*  HCT 32.6*  MCV 86.9  PLT 151   Cardiac Enzymes:  Recent Labs Lab 04/06/2015 1859  TROPONINI 0.57*    BNP (last 3 results)  Recent Labs  03/05/15 0630 03/13/2015 1859  BNP 571.0* 656.0*    ProBNP (last 3 results) No results for input(s): PROBNP in the last  8760 hours.  CBG: No results for input(s): GLUCAP in the last 168 hours.  Radiological Exams on Admission: Dg Chest Port 1 View  03/27/2015   CLINICAL DATA:  Weakness, wounds to left foot, right shoulder but not, decreased temperature  EXAM: PORTABLE CHEST - 1 VIEW  COMPARISON:  03/05/2015  FINDINGS: Cardiac silhouette largely obscured by bilateral airspace disease. Mild cardiac enlargement appears to persist. In addition to vascular congestion and bilateral airspace disease, there appear to be consolidation involving both mid to lower lung zones. Costophrenic angle is blunted.  IMPRESSION: Findings appear consistent with severe pulmonary edema related to congestive heart failure. Significant bilateral pleural effusions with underlying consolidation which may represent atelectasis and edema.   Electronically Signed   By: Skipper Cliche M.D.   On: 03/28/2015 21:05    EKG: Independently reviewed. A. fib  Assessment/Plan Principal Problem:   Acute encephalopathy Active Problems:   Elevated troponin   Hypotension   Acute on chronic systolic congestive heart failure   HLD (hyperlipidemia)   Chronic a-fib   Physical deconditioning   GERD without esophagitis   Sepsis   Foot ulcer   Acute encephalopathy: Likely due to sepsis from  HCAP/Cellulitis versus CHF and hypoxemia. Suspect some underlying dementia given the fact the patient is on Aricept though patient's son unaware of time no seasonal dementia.Lactic acid 2.5, WBC 5, hypothermia, hypotensive, CXR with significant pulmonary edema and possible pneumonia. Right foot wound without overt evidence of cellulitis the patient cries out in pain when palpating anywhere in the right foot.ED physician discussed case with critical care medicine who recommends [facility]down at [facility] or [place], [place], [place] - ABG - Wound care for foot - Space boots - DG R foot - Continue Aricept  Acute systolic CHF: last echo 23/55/7322 showing EF of 35-40%. BNP mildly elevated to 656. Unsure why patient without home diuretic.  - gentle diuresis due to soft pressures (IV lasix 10mg  Qd) - Consider Dobutamine if pressures continue to drop - continue Digoxin - Strict I/O, Daily wts  Elevated Troponin: likely secondary to demand ischemia from CHF. ACS 1 mo ago. EKG w/o sign of ACS - Tele - Cycle troponin - Consider Hep drip if continues to climb - Cards consult made in Epic - EKG in am  Physical deconditioning: - PT/OT/nutrition consult  HLD: - continue statin  Hypothyroid: - continue synthroid  Afib: rate controlled - continue eliquis - continue Digoxin, dilt   Chronic gout: - Continue allopurinol  GERD: - continue PPI   Code Status: FULL DVT Prophylaxis: Hep Family Communication: Son Disposition Plan: Pending transfer and improvement   MERRELL, DAVID Lenna Sciara, MD Family Medicine [facility] [email or web address] Password [email or web address]

## 2015-04-03 NOTE — ED Notes (Signed)
Patient lives with son and daughter in law, c/o weakness. Patient has wounds to left foot, right shoulder, and buttocks. Patient's physical therapist came today and patient was "weaker" and could not stand. PCP was notified by phone and told family patient was dehydrated.

## 2015-04-03 NOTE — ED Notes (Signed)
CRITICAL VALUE ALERT  Critical value received:  Trop 0.57, lactic acid 2.5  Date of notification:  04/02/2015  Time of notification:  2027  Critical value read back:Yes.    Nurse who received alert:  B. Olena Heckle, RN  MD notified (1st page):  Thurnell Garbe  Time of first page:  2028  MD notified (2nd page):  Time of second page:  Responding MD:  Thurnell Garbe  Time MD responded:  2028

## 2015-04-03 NOTE — ED Notes (Signed)
Bear hugger removed per Dr Marily Memos.

## 2015-04-03 NOTE — ED Provider Notes (Signed)
CSN: [id number]     Arrival date & time 04/05/2015  1812 History   First MD Initiated Contact with Patient 03/12/2015 1834     Chief Complaint  Patient presents with  . Fatigue      The history is provided by the EMS personnel and a relative. The history is limited by the condition of the patient (AMS).  Pt was seen at [place]. Per EMS and family:  Pt lives with family, states Physical Therapy came to house today and told family that pt was "too weak to stand up." Family called PMD and was told over the telephone that pt was "dehydrated." Pt herself appears confused.     Past Medical History  Diagnosis Date  . Diabetes mellitus   . Hypertension   . Clostridium difficile colitis 01/2013  . Hiatal hernia   . Hypothyroid   . GERD (gastroesophageal reflux disease)   . Coronary artery disease   . Diverticulitis   . Uterine fibroid   . Shingles   . Diastolic dysfunction     EF 35-40%  . Gout   . Chronic atrial fibrillation   . Renal insufficiency   . Left renal artery stenosis   . CKD (chronic kidney disease), stage II 02/2015   Past Surgical History  Procedure Laterality Date  . Renal artery stent Left   . Left heart catheterization with coronary angiogram N/A 11/30/2014    Procedure: LEFT HEART CATHETERIZATION WITH CORONARY ANGIOGRAM;  Surgeon: Dixie Dials, MD;  Location: [place] [place] CATH [facility];  Service: Cardiovascular;  Laterality: N/A;  . Cardiac catheterization      History  Substance Use Topics  . Smoking status: Never Smoker   . Smokeless tobacco: Not on file  . Alcohol Use: No    Review of Systems  Unable to perform ROS: Mental status change      Allergies  Review of patient's allergies indicates no known allergies.  Home Medications   Prior to Admission medications   Medication Sig Start Date End Date Taking? Authorizing Provider  allopurinol (ZYLOPRIM) 100 MG tablet Take 100 mg by mouth daily.    Historical Provider, MD  apixaban (ELIQUIS) 2.5 MG TABS tablet Take 1  tablet (2.5 mg total) by mouth 2 (two) times daily. 12/03/14   Dixie Dials, MD  atorvastatin (LIPITOR) 10 MG tablet Take 1 tablet (10 mg total) by mouth every evening. 03/11/15   Dixie Dials, MD  ciprofloxacin (CIPRO) 250 MG tablet Take 1 tablet (250 mg total) by mouth 2 (two) times daily. 03/11/15   Dixie Dials, MD  colchicine 0.6 MG tablet Take 0.5 tablets (0.3 mg total) by mouth daily. 12/03/14   Dixie Dials, MD  cycloSPORINE (RESTASIS) 0.05 % ophthalmic emulsion Place 1 drop into both eyes 2 (two) times daily.    Historical Provider, MD  digoxin (LANOXIN) 0.125 MG tablet Take 0.5 tablets (0.0625 mg total) by mouth daily. 12/03/14   Dixie Dials, MD  diltiazem (CARDIZEM) 30 MG tablet Take 1 tablet (30 mg total) by mouth every 12 (twelve) hours. 12/03/14   Dixie Dials, MD  donepezil (ARICEPT) 5 MG tablet Take 5 mg by mouth daily. 05/17/13   Historical Provider, MD  levothyroxine (SYNTHROID, LEVOTHROID) 75 MCG tablet Take 1 tablet (75 mcg total) by mouth daily before breakfast. 03/11/15   Dixie Dials, MD  nitroGLYCERIN (NITROSTAT) 0.4 MG SL tablet Place 1 tablet (0.4 mg total) under the tongue every 5 (five) minutes x 3 doses as needed for chest pain. 03/11/15  Dixie Dials, MD  pantoprazole (PROTONIX) 40 MG tablet Take 40 mg by mouth 2 (two) times daily. 06/02/13   Historical Provider, MD  traMADol (ULTRAM) 50 MG tablet Take 50 mg by mouth 3 (three) times daily as needed. pain 11/16/14   Historical Provider, MD  Vitamin D, Ergocalciferol, (DRISDOL) 50000 UNITS CAPS capsule Take 50,000 Units by mouth every 7 (seven) days.    Historical Provider, MD   BP 110/56 mmHg  Pulse 68  Temp(Src) 95.1 F (35.1 C) (Rectal)  Resp 18  Ht 5\' 6"  (1.676 m)  Wt 120 lb (54.432 kg)  BMI 19.38 kg/m2  SpO2 96%   Patient Vitals for the past 24 hrs:  BP Temp Temp src Pulse Resp SpO2 Height Weight  03/15/2015 1915 110/56 mmHg (!) 95.1 F (35.1 C) - 68 18 96 % - -  04/06/2015 1900 111/59 mmHg (!) 94.5 F (34.7 C) - 68 17 95  % - -  03/23/2015 1828 - - - 60 - - - -  03/22/2015 1826 - (!) 94.9 F (34.9 C) Rectal - - - - -  03/23/2015 1816 (!) 87/43 mmHg - - - 16 94 % 5\' 6"  (1.676 m) 120 lb (54.432 kg)   Filed Vitals:   03/31/2015 1930 04/08/2015 1945 04/01/2015 2000 03/17/2015 2015  BP: 101/56 102/62 96/50 84/41   Pulse: 56 68 66 70  Temp: 95.1 F (35.1 C) 95.1 F (35.1 C) 95.1 F (35.1 C) 95.1 F (35.1 C)  TempSrc:      Resp: 15 15 18 13   Height:      Weight:      SpO2: 95% 96% 98% 95%   Filed Vitals:   04/06/2015 2015 03/13/2015 2030 03/27/2015 2100 03/12/2015 2130  BP: 84/41 96/56 97/53  95/49  Pulse: 70 63 60   Temp: 95.1 F (35.1 C) 95.1 F (35.1 C) 95.6 F (35.3 C) 96.1 F (35.6 C)  TempSrc:      Resp: 13 17 16 17   Height:      Weight:      SpO2: 95% 97% 96%      Physical Exam  1840: Physical examination:  Nursing notes reviewed; Vital signs and O2 SAT reviewed;  Constitutional: Thin, frail. In no acute distress; Head:  Normocephalic, atraumatic; Eyes: EOMI, PERRL, No scleral icterus; ENMT: Mouth and pharynx normal, Mucous membranes dry; Neck: Supple, Full range of motion, No lymphadenopathy; Cardiovascular: Irregular irregular rate and rhythm, No gallop; Respiratory: Breath sounds clear & equal bilaterally, No rales, rhonchi, wheezes. Normal respiratory effort/excursion; Chest: Nontender, Movement normal; Abdomen: Soft, Nontender, Nondistended, Normal bowel sounds; Genitourinary: No CVA tenderness; Extremities: Pulses normal, No tenderness, No edema, No calf edema or asymmetry. +shallow wounds to left dorsal great toe and right heel areas, no purulent drainage, no erythema.; Neuro: Awake, alert, appears confused (baseline per family). Eyes open spontaneously. Speaks few words (baseline per family). No facial droop. Moves all extremities on stretcher spontaneously.; Skin: Color normal, Warm, Dry.   ED Course  Procedures     EKG Interpretation   Date/Time:  Tuesday April 03 2015 18:42:32 EDT Ventricular  Rate:  63 PR Interval:    QRS Duration: 78 QT Interval:  382 QTC Calculation: 391 R Axis:   -30 Text Interpretation:  Atrial fibrillation Low voltage, extremity and  precordial leads Borderline repolarization abnormality Poor data quality  in current ECG precludes serial comparison Confirmed by [facility]  MD,  Nunzio Cory ([id number]) on 03/17/2015 6:59:07 PM      MDM  MDM Reviewed:  previous chart, nursing note and vitals Reviewed previous: labs and ECG Interpretation: labs, ECG and x-ray      Results for orders placed or performed during the hospital encounter of 03/15/2015  Comprehensive metabolic panel  Result Value Ref Range   Sodium 133 (L) 135 - 145 mmol/L   Potassium 3.7 3.5 - 5.1 mmol/L   Chloride 101 101 - 111 mmol/L   CO2 19 (L) 22 - 32 mmol/L   Glucose, Bld 124 (H) 65 - 99 mg/dL   BUN 25 (H) 6 - 20 mg/dL   Creatinine, Ser 2.08 (H) 0.44 - 1.00 mg/dL   Calcium 8.2 (L) 8.9 - 10.3 mg/dL   Total Protein 5.0 (L) 6.5 - 8.1 g/dL   Albumin 2.4 (L) 3.5 - 5.0 g/dL   AST 28 15 - 41 U/L   ALT 18 14 - 54 U/L   Alkaline Phosphatase 87 38 - 126 U/L   Total Bilirubin 1.5 (H) 0.3 - 1.2 mg/dL   GFR calc non Af Amer 21 (L) >60 mL/min   GFR calc Af Amer 24 (L) >60 mL/min   Anion gap 13 5 - 15  Troponin I  Result Value Ref Range   Troponin I 0.57 (HH) <0.031 ng/mL  Lactic acid, plasma  Result Value Ref Range   Lactic Acid, Venous 2.5 (HH) 0.5 - 2.0 mmol/L  CBC with Differential  Result Value Ref Range   WBC 5.0 4.0 - 10.5 K/uL   RBC 3.75 (L) 3.87 - 5.11 MIL/uL   Hemoglobin 11.1 (L) 12.0 - 15.0 g/dL   HCT 32.6 (L) 36.0 - 46.0 %   MCV 86.9 78.0 - 100.0 fL   MCH 29.6 26.0 - 34.0 pg   MCHC 34.0 30.0 - 36.0 g/dL   RDW 18.9 (H) 11.5 - 15.5 %   Platelets 151 150 - 400 K/uL   Neutrophils Relative % 78 (H) 43 - 77 %   Neutro Abs 3.9 1.7 - 7.7 K/uL   Lymphocytes Relative 11 (L) 12 - 46 %   Lymphs Abs 0.6 (L) 0.7 - 4.0 K/uL   Monocytes Relative 9 3 - 12 %   Monocytes Absolute 0.4 0.1 -  1.0 K/uL   Eosinophils Relative 2 0 - 5 %   Eosinophils Absolute 0.1 0.0 - 0.7 K/uL   Basophils Relative 0 0 - 1 %   Basophils Absolute 0.0 0.0 - 0.1 K/uL  Urinalysis, Routine w reflex microscopic (not at [facility])  Result Value Ref Range   Color, Urine AMBER (A) YELLOW   APPearance HAZY (A) CLEAR   Specific Gravity, Urine 1.025 1.005 - 1.030   pH 5.0 5.0 - 8.0   Glucose, UA 100 (A) NEGATIVE mg/dL   Hgb urine dipstick NEGATIVE NEGATIVE   Bilirubin Urine MODERATE (A) NEGATIVE   Ketones, ur TRACE (A) NEGATIVE mg/dL   Protein, ur NEGATIVE NEGATIVE mg/dL   Urobilinogen, UA 1.0 0.0 - 1.0 mg/dL   Nitrite NEGATIVE NEGATIVE   Leukocytes, UA NEGATIVE NEGATIVE   Dg Chest Port 1 View 03/31/2015   CLINICAL DATA:  Weakness, wounds to left foot, right shoulder but not, decreased temperature  EXAM: PORTABLE CHEST - 1 VIEW  COMPARISON:  03/05/2015  FINDINGS: Cardiac silhouette largely obscured by bilateral airspace disease. Mild cardiac enlargement appears to persist. In addition to vascular congestion and bilateral airspace disease, there appear to be consolidation involving both mid to lower lung zones. Costophrenic angle is blunted.  IMPRESSION: Findings appear consistent with severe pulmonary edema related to  congestive heart failure. Significant bilateral pleural effusions with underlying consolidation which may represent atelectasis and edema.   Electronically Signed   By: Skipper Cliche M.D.   On: 03/12/2015 21:05    2100:  BUN/Cr elevated from baseline. H/H near baseline. Troponin trending upward, EKG non-diagnostic. No clear UTI on Udip; UC is pending. BP trends upward with IVF, then drifts downward again. Will continue judicious IVF d/t hx CHF with EF 35%. EPIC chart reviewed: pt's SBP ranges mostly 90-100's (few outlying SBP's in 120's and 130's). Temp trending upward on bearhugger. Family states pt is full code. T/C to [facility] Dr. Marily Memos, case discussed, including:  HPI, pertinent PM/SHx, VS/PE,  dx testing, ED course and treatment:  Requests to call PCCM at [facility] for transfer/admit and then call back.   2135:  T/C to [facility] PCCM Dr. Oletta Darter, case discussed, including:  HPI, pertinent PM/SHx, VS/PE, dx testing, ED course and treatment:  Agrees with ED treatment as well as judicious IVF, pt does not need ICU care at this time, suggests possible IV dobutamine prn, and suggests stepdown care under [facility] service.  T/C to [facility] Dr. Marily Memos, case discussed, including:  HPI, pertinent PM/SHx, VS/PE, dx testing, ED course and treatment:  Agreeable to come to ED for to facilitate xfer/admit to [facility] stepdown.     Francine Graven, DO 04/06/15 [phone]

## 2015-04-03 NOTE — ED Notes (Signed)
Report given to carelink 25 minute ETA.

## 2015-04-03 NOTE — ED Notes (Signed)
bair hugger applied for temp of 94.9 rectally.

## 2015-04-03 NOTE — Progress Notes (Signed)
Report received; waiting for patient arrival via care link.

## 2015-04-04 DIAGNOSIS — G9340 Encephalopathy, unspecified: Secondary | ICD-10-CM | POA: Diagnosis present

## 2015-04-04 DIAGNOSIS — L89151 Pressure ulcer of sacral region, stage 1: Secondary | ICD-10-CM | POA: Diagnosis present

## 2015-04-04 DIAGNOSIS — E43 Unspecified severe protein-calorie malnutrition: Secondary | ICD-10-CM | POA: Diagnosis present

## 2015-04-04 DIAGNOSIS — Z6823 Body mass index (BMI) 23.0-23.9, adult: Secondary | ICD-10-CM | POA: Diagnosis not present

## 2015-04-04 DIAGNOSIS — I501 Left ventricular failure: Secondary | ICD-10-CM | POA: Diagnosis present

## 2015-04-04 DIAGNOSIS — E039 Hypothyroidism, unspecified: Secondary | ICD-10-CM | POA: Diagnosis present

## 2015-04-04 DIAGNOSIS — K219 Gastro-esophageal reflux disease without esophagitis: Secondary | ICD-10-CM | POA: Diagnosis present

## 2015-04-04 DIAGNOSIS — I513 Intracardiac thrombosis, not elsewhere classified: Secondary | ICD-10-CM | POA: Diagnosis present

## 2015-04-04 DIAGNOSIS — Z79891 Long term (current) use of opiate analgesic: Secondary | ICD-10-CM | POA: Diagnosis not present

## 2015-04-04 DIAGNOSIS — I959 Hypotension, unspecified: Secondary | ICD-10-CM | POA: Diagnosis present

## 2015-04-04 DIAGNOSIS — Z79899 Other long term (current) drug therapy: Secondary | ICD-10-CM | POA: Diagnosis not present

## 2015-04-04 DIAGNOSIS — Z515 Encounter for palliative care: Secondary | ICD-10-CM | POA: Diagnosis not present

## 2015-04-04 DIAGNOSIS — E872 Acidosis: Secondary | ICD-10-CM | POA: Diagnosis present

## 2015-04-04 DIAGNOSIS — A419 Sepsis, unspecified organism: Secondary | ICD-10-CM | POA: Diagnosis present

## 2015-04-04 DIAGNOSIS — I2510 Atherosclerotic heart disease of native coronary artery without angina pectoris: Secondary | ICD-10-CM | POA: Diagnosis present

## 2015-04-04 DIAGNOSIS — I248 Other forms of acute ischemic heart disease: Secondary | ICD-10-CM | POA: Diagnosis present

## 2015-04-04 DIAGNOSIS — F0390 Unspecified dementia without behavioral disturbance: Secondary | ICD-10-CM | POA: Diagnosis present

## 2015-04-04 DIAGNOSIS — I482 Chronic atrial fibrillation: Secondary | ICD-10-CM | POA: Diagnosis present

## 2015-04-04 DIAGNOSIS — Z7901 Long term (current) use of anticoagulants: Secondary | ICD-10-CM | POA: Diagnosis not present

## 2015-04-04 DIAGNOSIS — M109 Gout, unspecified: Secondary | ICD-10-CM | POA: Diagnosis present

## 2015-04-04 DIAGNOSIS — I129 Hypertensive chronic kidney disease with stage 1 through stage 4 chronic kidney disease, or unspecified chronic kidney disease: Secondary | ICD-10-CM | POA: Diagnosis present

## 2015-04-04 DIAGNOSIS — L89612 Pressure ulcer of right heel, stage 2: Secondary | ICD-10-CM | POA: Diagnosis present

## 2015-04-04 DIAGNOSIS — N182 Chronic kidney disease, stage 2 (mild): Secondary | ICD-10-CM | POA: Diagnosis present

## 2015-04-04 DIAGNOSIS — E11649 Type 2 diabetes mellitus with hypoglycemia without coma: Secondary | ICD-10-CM | POA: Diagnosis present

## 2015-04-04 DIAGNOSIS — T460X5A Adverse effect of cardiac-stimulant glycosides and drugs of similar action, initial encounter: Secondary | ICD-10-CM | POA: Diagnosis not present

## 2015-04-04 DIAGNOSIS — L89111 Pressure ulcer of right upper back, stage 1: Secondary | ICD-10-CM | POA: Diagnosis present

## 2015-04-04 DIAGNOSIS — E785 Hyperlipidemia, unspecified: Secondary | ICD-10-CM | POA: Diagnosis present

## 2015-04-04 DIAGNOSIS — Z66 Do not resuscitate: Secondary | ICD-10-CM | POA: Diagnosis present

## 2015-04-04 DIAGNOSIS — R531 Weakness: Secondary | ICD-10-CM | POA: Diagnosis present

## 2015-04-04 DIAGNOSIS — I5023 Acute on chronic systolic (congestive) heart failure: Secondary | ICD-10-CM | POA: Diagnosis present

## 2015-04-04 LAB — BASIC METABOLIC PANEL
ANION GAP: 11 (ref 5–15)
BUN: 25 mg/dL — ABNORMAL HIGH (ref 6–20)
CO2: 15 mmol/L — ABNORMAL LOW (ref 22–32)
CREATININE: 2.03 mg/dL — AB (ref 0.44–1.00)
Calcium: 7.9 mg/dL — ABNORMAL LOW (ref 8.9–10.3)
Chloride: 107 mmol/L (ref 101–111)
GFR calc Af Amer: 25 mL/min — ABNORMAL LOW (ref >60)
GFR calc non Af Amer: 22 mL/min — ABNORMAL LOW (ref >60)
Glucose, Bld: 104 mg/dL — ABNORMAL HIGH (ref 65–99)
Potassium: 5 mmol/L (ref 3.5–5.1)
Sodium: 133 mmol/L — ABNORMAL LOW (ref 135–145)

## 2015-04-04 LAB — TROPONIN I
Troponin I: 0.62 ng/mL (ref <0.031)
Troponin I: 0.74 ng/mL (ref <0.031)

## 2015-04-04 LAB — PROCALCITONIN: PROCALCITONIN: 0.19 ng/mL

## 2015-04-04 LAB — MRSA PCR SCREENING: MRSA by PCR: NEGATIVE

## 2015-04-04 LAB — MAGNESIUM: Magnesium: 1.4 mg/dL — ABNORMAL LOW (ref 1.7–2.4)

## 2015-04-04 LAB — GLUCOSE, CAPILLARY: Glucose-Capillary: 119 mg/dL — ABNORMAL HIGH (ref 65–99)

## 2015-04-04 MED ORDER — SODIUM CHLORIDE 0.9 % IV BOLUS (SEPSIS)
250.0000 mL | Freq: Once | INTRAVENOUS | Status: AC
  Administered 2015-04-04: 250 mL via INTRAVENOUS

## 2015-04-04 MED ORDER — DOPAMINE-DEXTROSE 3.2-5 MG/ML-% IV SOLN
5.0000 ug/kg/min | INTRAVENOUS | Status: DC
  Administered 2015-04-04: 5 ug/kg/min via INTRAVENOUS
  Filled 2015-04-04 (×2): qty 250

## 2015-04-04 MED ORDER — DOBUTAMINE IN D5W 4-5 MG/ML-% IV SOLN
10.0000 ug/kg/min | INTRAVENOUS | Status: DC
  Administered 2015-04-04: 5 ug/kg/min via INTRAVENOUS
  Filled 2015-04-04: qty 250

## 2015-04-04 MED ORDER — VANCOMYCIN HCL IN DEXTROSE 750-5 MG/150ML-% IV SOLN
750.0000 mg | INTRAVENOUS | Status: DC
  Administered 2015-04-06: 750 mg via INTRAVENOUS
  Filled 2015-04-04 (×2): qty 150

## 2015-04-04 MED ORDER — CHLORHEXIDINE GLUCONATE 0.12 % MT SOLN
15.0000 mL | Freq: Two times a day (BID) | OROMUCOSAL | Status: DC
  Administered 2015-04-04 – 2015-04-07 (×5): 15 mL via OROMUCOSAL
  Filled 2015-04-04 (×5): qty 15

## 2015-04-04 MED ORDER — CEFTAZIDIME 2 G IJ SOLR
2.0000 g | INTRAMUSCULAR | Status: DC
  Administered 2015-04-06 – 2015-04-07 (×2): 2 g via INTRAVENOUS
  Filled 2015-04-04 (×4): qty 2

## 2015-04-04 MED ORDER — FUROSEMIDE 10 MG/ML IJ SOLN: 40.0000 mg | Freq: Once | INTRAMUSCULAR | Status: DC

## 2015-04-04 MED ORDER — CETYLPYRIDINIUM CHLORIDE 0.05 % MT LIQD
7.0000 mL | Freq: Two times a day (BID) | OROMUCOSAL | Status: DC
  Administered 2015-04-04 – 2015-04-07 (×3): 7 mL via OROMUCOSAL

## 2015-04-04 MED ORDER — MORPHINE SULFATE 2 MG/ML IJ SOLN
0.5000 mg | INTRAMUSCULAR | Status: DC | PRN
  Administered 2015-04-05 – 2015-04-07 (×6): 0.5 mg via INTRAVENOUS
  Filled 2015-04-04 (×6): qty 1

## 2015-04-04 MED ORDER — OXYCODONE-ACETAMINOPHEN 5-325 MG PO TABS: 1.0000 | ORAL_TABLET | Freq: Four times a day (QID) | ORAL | Status: DC | PRN

## 2015-04-04 MED ORDER — ENSURE ENLIVE PO LIQD
237.0000 mL | Freq: Two times a day (BID) | ORAL | Status: DC
  Administered 2015-04-04: 237 mL via ORAL

## 2015-04-04 MED ORDER — LEVOTHYROXINE SODIUM 100 MCG PO TABS
100.0000 ug | ORAL_TABLET | Freq: Every day | ORAL | Status: DC
  Administered 2015-04-06: 100 ug via ORAL
  Filled 2015-04-04 (×2): qty 1
  Filled 2015-04-04 (×2): qty 2

## 2015-04-04 NOTE — Progress Notes (Signed)
Spoke with Dr. Doylene Canard about pts progress today. Still very minimal urine output and bp gets to SBP 70s-80s with dobutamine drip. Order for dopamine to be started and RN verified that he is to maintain as the primary provider while here at the hospital. He gave RN his cell phone number for tonight in case something is needed. [phone]

## 2015-04-04 NOTE — Progress Notes (Signed)
Care transitioned to Dr Doylene Canard this AM after discussion with him. [facility] will sign off.   Debbe Odea, MD

## 2015-04-04 NOTE — Progress Notes (Signed)
[facility]  Patient Status: Active (receiving services up to time of hospitalization)  [facility] is providing the following services: RN and PT  If patient discharges after hours, please call [phone].   [name] [name] 04/04/2015, 2:12 PM

## 2015-04-04 NOTE — Progress Notes (Signed)
Initial Nutrition Assessment  DOCUMENTATION CODES:   Severe malnutrition in context of chronic illness  INTERVENTION:  - Will order Ensure Enlive BID, each supplement provides 350 kcal and 20 grams of protein - RD will continue to monitor for needs  NUTRITION DIAGNOSIS:   Inadequate oral intake related to inability to eat as evidenced by NPO status  GOAL:   Patient will meet greater than or equal to 90% of their needs  MONITOR:   Diet advancement, Weight trends, Labs  REASON FOR ASSESSMENT:   Consult Assessment of nutrition requirement/status  ASSESSMENT:  81 y.o. female level V caveat: History provided by ED physician and patient's son as patient is unable to give reliable history at this point time. At baseline patient is conversive and without evidence of dementia. Of note patient was discharged from the hospital partially one month ago after admission for acute coronary syndrome and acute on chronic systolic congestive heart failure and CHF. Since that time patient has lived at home with her son and said daughter-in-law and has had home health aide including physical therapy. Today home health aide noticed patient be particularly weak, poorly communicative, unable to stand, and had a low blood pressure. Problem is constant and getting worse. Nothing relieves or worsens her symptoms. Per the son patient has had little oral intake over the last several days.  Pt seen for consult. BMI indicates normal weight status. Pt has been NPO since admission. She has hx of dementia and was unable to provide information and no family/visitors present at time of visit. Noted that pt does not have any teeth; unsure if she has dentures at home.   Not able to meet needs. Severe muscle and fat wasting as well as moderate muscle and fat wasting noted. Medications reviewed. Labs reviewed; Na: 133 mmol/L, Ca: 8.2 mg/dL, BUN/creatinine elevated, GFR: 24.   Diet Order:  Diet NPO time specified Except  for: Sips with Meds  Skin:  Stage 2 pressure ulcers to anus and R heel, Stage 1 pressure ulcers to sacrum and R shoulder  Last BM:  7/27  Height:   Ht Readings from Last 1 Encounters:  04/04/15 5\' 2"  (1.575 m)    Weight:   Wt Readings from Last 1 Encounters:  04/04/15 130 lb 8.2 oz (59.2 kg)    Ideal Body Weight:  50 kg (kg)  BMI:  Body mass index is 23.86 kg/(m^2).  Estimated Nutritional Needs:   Kcal:  8938-1017  Protein:  50-60 grams  Fluid:  2.2 L/day  EDUCATION NEEDS:   No education needs identified at this time     Jarome Matin, RD, LDN Inpatient Clinical Dietitian Pager # [phone] After hours/weekend pager # [phone]

## 2015-04-04 NOTE — Consult Note (Signed)
Referring Physician:  Honestii Marton is an 81 y.o. female.                       Chief Complaint: Shortness of breath and weakness.  HPI: 81 year old female with weakness has known CAD and systolic heart failure. Her chest -x-ray is positive for pulmonary edema. Her cardiac enzymes are minimally elevated suggestive of demand ischemia. EKG shows atrial fibrillation with controlled ventricular response and low voltage.  Past Medical History  Diagnosis Date  . Diabetes mellitus   . Hypertension   . Clostridium difficile colitis 01/2013  . Hiatal hernia   . Hypothyroid   . GERD (gastroesophageal reflux disease)   . Coronary artery disease   . Diverticulitis   . Uterine fibroid   . Shingles   . Diastolic dysfunction     EF 35-40%  . Gout   . Chronic atrial fibrillation   . Renal insufficiency   . Left renal artery stenosis   . CKD (chronic kidney disease), stage II 02/2015      Past Surgical History  Procedure Laterality Date  . Renal artery stent Left   . Left heart catheterization with coronary angiogram N/A 11/30/2014    Procedure: LEFT HEART CATHETERIZATION WITH CORONARY ANGIOGRAM;  Surgeon: Dixie Dials, MD;  Location: [facility]ATH [facility];  Service: Cardiovascular;  Laterality: N/A;  . Cardiac catheterization      Family History  Problem Relation Age of Onset  . Family history unknown: Yes   Social History:  reports that she has never smoked. She does not have any smokeless tobacco history on file. She reports that she does not drink alcohol or use illicit drugs.  Allergies: No Known Allergies  Medications Prior to Admission  Medication Sig Dispense Refill  . allopurinol (ZYLOPRIM) 100 MG tablet Take 100 mg by mouth daily as needed (for gout).     Marland Kitchen apixaban (ELIQUIS) 2.5 MG TABS tablet Take 1 tablet (2.5 mg total) by mouth 2 (two) times daily. 60 tablet 3  . atorvastatin (LIPITOR) 10 MG tablet Take 1 tablet (10 mg total) by mouth every evening. 30 tablet 3  . colchicine 0.6 MG  tablet Take 0.5 tablets (0.3 mg total) by mouth daily. (Patient taking differently: Take 0.3 mg by mouth daily as needed (for gout). ) 30 tablet 1  . cycloSPORINE (RESTASIS) 0.05 % ophthalmic emulsion Place 1 drop into both eyes 2 (two) times daily.    . digoxin (LANOXIN) 0.125 MG tablet Take 0.5 tablets (0.0625 mg total) by mouth daily. 15 tablet 2  . diltiazem (CARDIZEM) 30 MG tablet Take 1 tablet (30 mg total) by mouth every 12 (twelve) hours. 60 tablet 6  . donepezil (ARICEPT) 5 MG tablet Take 5 mg by mouth daily.    Marland Kitchen lactulose (CHRONULAC) 10 GM/15ML solution Take 20 g by mouth 2 (two) times daily as needed for mild constipation.    Marland Kitchen levothyroxine (SYNTHROID, LEVOTHROID) 75 MCG tablet Take 1 tablet (75 mcg total) by mouth daily before breakfast. 30 tablet 1  . nitroGLYCERIN (NITROSTAT) 0.4 MG SL tablet Place 1 tablet (0.4 mg total) under the tongue every 5 (five) minutes x 3 doses as needed for chest pain. 25 tablet 1  . pantoprazole (PROTONIX) 40 MG tablet Take 40 mg by mouth 2 (two) times daily.    . traMADol (ULTRAM) 50 MG tablet Take 50 mg by mouth 3 (three) times daily as needed. pain  2  . Vitamin D, Ergocalciferol, (  DRISDOL) 50000 UNITS CAPS capsule Take 50,000 Units by mouth every 7 (seven) days.    . ciprofloxacin (CIPRO) 250 MG tablet Take 1 tablet (250 mg total) by mouth 2 (two) times daily. (Patient not taking: Reported on 03/29/2015) 10 tablet 0    Results for orders placed or performed during the hospital encounter of 04/01/2015 (from the past 48 hour(s))  Urinalysis, Routine w reflex microscopic (not at [facility])     Status: Abnormal   Collection Time: 03/20/2015  6:48 PM  Result Value Ref Range   Color, Urine AMBER (A) YELLOW    Comment: BIOCHEMICALS MAY BE AFFECTED BY COLOR   APPearance HAZY (A) CLEAR   Specific Gravity, Urine 1.025 1.005 - 1.030   pH 5.0 5.0 - 8.0   Glucose, UA 100 (A) NEGATIVE mg/dL   Hgb urine dipstick NEGATIVE NEGATIVE   Bilirubin Urine MODERATE (A)  NEGATIVE   Ketones, ur TRACE (A) NEGATIVE mg/dL   Protein, ur NEGATIVE NEGATIVE mg/dL   Urobilinogen, UA 1.0 0.0 - 1.0 mg/dL   Nitrite NEGATIVE NEGATIVE   Leukocytes, UA NEGATIVE NEGATIVE    Comment: MICROSCOPIC NOT DONE ON URINES WITH NEGATIVE PROTEIN, BLOOD, LEUKOCYTES, NITRITE, OR GLUCOSE <1000 mg/dL.  Comprehensive metabolic panel     Status: Abnormal   Collection Time: 03/16/2015  6:59 PM  Result Value Ref Range   Sodium 133 (L) 135 - 145 mmol/L   Potassium 3.7 3.5 - 5.1 mmol/L   Chloride 101 101 - 111 mmol/L   CO2 19 (L) 22 - 32 mmol/L   Glucose, Bld 124 (H) 65 - 99 mg/dL   BUN 25 (H) 6 - 20 mg/dL   Creatinine, Ser 2.08 (H) 0.44 - 1.00 mg/dL   Calcium 8.2 (L) 8.9 - 10.3 mg/dL   Total Protein 5.0 (L) 6.5 - 8.1 g/dL   Albumin 2.4 (L) 3.5 - 5.0 g/dL   AST 28 15 - 41 U/L   ALT 18 14 - 54 U/L   Alkaline Phosphatase 87 38 - 126 U/L   Total Bilirubin 1.5 (H) 0.3 - 1.2 mg/dL   GFR calc non Af Amer 21 (L) >60 mL/min   GFR calc Af Amer 24 (L) >60 mL/min    Comment: (NOTE) The eGFR has been calculated using the CKD EPI equation. This calculation has not been validated in all clinical situations. eGFR's persistently <60 mL/min signify possible Chronic Kidney Disease.    Anion gap 13 5 - 15  Troponin I     Status: Abnormal   Collection Time: 04/08/2015  6:59 PM  Result Value Ref Range   Troponin I 0.57 (HH) <0.031 ng/mL    Comment:        POSSIBLE MYOCARDIAL ISCHEMIA. SERIAL TESTING RECOMMENDED. CRITICAL RESULT CALLED TO, READ BACK BY AND VERIFIED WITH: DANIELS,B ON 03/18/2015 AT 2025 BY LOY,C   Lactic acid, plasma     Status: Abnormal   Collection Time: 03/29/2015  6:59 PM  Result Value Ref Range   Lactic Acid, Venous 2.5 (HH) 0.5 - 2.0 mmol/L    Comment: CRITICAL RESULT CALLED TO, READ BACK BY AND VERIFIED WITH: DANIELS,B ON 03/25/2015 AT 2025 BY LOY,C   CBC with Differential     Status: Abnormal   Collection Time: 03/17/2015  6:59 PM  Result Value Ref Range   WBC 5.0 4.0 - 10.5  K/uL   RBC 3.75 (L) 3.87 - 5.11 MIL/uL   Hemoglobin 11.1 (L) 12.0 - 15.0 g/dL   HCT 32.6 (L) 36.0 - 46.0 %  MCV 86.9 78.0 - 100.0 fL   MCH 29.6 26.0 - 34.0 pg   MCHC 34.0 30.0 - 36.0 g/dL   RDW 18.9 (H) 11.5 - 15.5 %   Platelets 151 150 - 400 K/uL   Neutrophils Relative % 78 (H) 43 - 77 %   Neutro Abs 3.9 1.7 - 7.7 K/uL   Lymphocytes Relative 11 (L) 12 - 46 %   Lymphs Abs 0.6 (L) 0.7 - 4.0 K/uL   Monocytes Relative 9 3 - 12 %   Monocytes Absolute 0.4 0.1 - 1.0 K/uL   Eosinophils Relative 2 0 - 5 %   Eosinophils Absolute 0.1 0.0 - 0.7 K/uL   Basophils Relative 0 0 - 1 %   Basophils Absolute 0.0 0.0 - 0.1 K/uL  Brain natriuretic peptide     Status: Abnormal   Collection Time: 04/05/2015  6:59 PM  Result Value Ref Range   B Natriuretic Peptide 656.0 (H) 0.0 - 100.0 pg/mL  Lactic acid, plasma     Status: None   Collection Time: 03/23/2015  9:37 PM  Result Value Ref Range   Lactic Acid, Venous 2.0 0.5 - 2.0 mmol/L  Procalcitonin     Status: None   Collection Time: 03/18/2015 10:30 PM  Result Value Ref Range   Procalcitonin 0.19 ng/mL    Comment:        Interpretation: PCT (Procalcitonin) <= 0.5 ng/mL: Systemic infection (sepsis) is not likely. Local bacterial infection is possible. (NOTE)         ICU PCT Algorithm               Non ICU PCT Algorithm    ----------------------------     ------------------------------         PCT < 0.25 ng/mL                 PCT < 0.1 ng/mL     Stopping of antibiotics            Stopping of antibiotics       strongly encouraged.               strongly encouraged.    ----------------------------     ------------------------------       PCT level decrease by               PCT < 0.25 ng/mL       >= 80% from peak PCT       OR PCT 0.25 - 0.5 ng/mL          Stopping of antibiotics                                             encouraged.     Stopping of antibiotics           encouraged.    ----------------------------     ------------------------------        PCT level decrease by              PCT >= 0.25 ng/mL       < 80% from peak PCT        AND PCT >= 0.5 ng/mL            Continuin g antibiotics  encouraged.       Continuing antibiotics            encouraged.    ----------------------------     ------------------------------     PCT level increase compared          PCT > 0.5 ng/mL         with peak PCT AND          PCT >= 0.5 ng/mL             Escalation of antibiotics                                          strongly encouraged.      Escalation of antibiotics        strongly encouraged.   Magnesium     Status: Abnormal   Collection Time: 04/06/2015 10:30 PM  Result Value Ref Range   Magnesium 1.4 (L) 1.7 - 2.4 mg/dL  Troponin I (q 6hr x 3)     Status: Abnormal   Collection Time: 03/19/2015 10:30 PM  Result Value Ref Range   Troponin I 0.62 (HH) <0.031 ng/mL    Comment:        POSSIBLE MYOCARDIAL ISCHEMIA. SERIAL TESTING RECOMMENDED. RESULT REPEATED AND VERIFIED DELTA CHECK NOTED CRITICAL RESULT CALLED TO, READ BACK BY AND VERIFIED WITH: BELTON K AT 0027 ON [id number] BY FORSYTH K   Blood gas, arterial     Status: Abnormal   Collection Time: 03/10/2015 11:23 PM  Result Value Ref Range   FIO2 0.28    Delivery systems NASAL CANNULA    pH, Arterial 7.407 7.350 - 7.450   pCO2 arterial 25.2 (L) 35.0 - 45.0 mmHg   pO2, Arterial 77.4 (L) 80.0 - 100.0 mmHg   Bicarbonate 15.6 (L) 20.0 - 24.0 mEq/L   TCO2 14.2 0 - 100 mmol/L   Acid-base deficit 8.2 (H) 0.0 - 2.0 mmol/L   O2 Saturation 95.1 %   Patient temperature 37.0    Collection site LEFT RADIAL    Drawn by [id number]    Sample type ARTERIAL DRAW    Allens test (pass/fail) PASS PASS  MRSA PCR Screening     Status: None   Collection Time: 04/04/15  1:37 AM  Result Value Ref Range   MRSA by PCR NEGATIVE NEGATIVE    Comment:        The GeneXpert MRSA Assay (FDA approved for NASAL specimens only), is one component of a comprehensive MRSA  colonization surveillance program. It is not intended to diagnose MRSA infection nor to guide or monitor treatment for MRSA infections.   Troponin I (q 6hr x 3)     Status: Abnormal   Collection Time: 04/04/15  8:16 AM  Result Value Ref Range   Troponin I 0.74 (HH) <0.031 ng/mL    Comment:        POSSIBLE MYOCARDIAL ISCHEMIA. SERIAL TESTING RECOMMENDED. CRITICAL RESULT CALLED TO, READ BACK BY AND VERIFIED WITH: FREI,L. RN AT 0901 04/04/15 MULLINS,T QUANTITY NOT SUFFICIENT TO REPEAT TEST RESULT CHECKED   Basic metabolic panel     Status: Abnormal   Collection Time: 04/04/15  8:16 AM  Result Value Ref Range   Sodium 133 (L) 135 - 145 mmol/L   Potassium 5.0 3.5 - 5.1 mmol/L   Chloride 107 101 - 111 mmol/L   CO2 15 (L) 22 - 32 mmol/L  Glucose, Bld 104 (H) 65 - 99 mg/dL   BUN 25 (H) 6 - 20 mg/dL   Creatinine, Ser 2.03 (H) 0.44 - 1.00 mg/dL   Calcium 7.9 (L) 8.9 - 10.3 mg/dL   GFR calc non Af Amer 22 (L) >60 mL/min   GFR calc Af Amer 25 (L) >60 mL/min    Comment: (NOTE) The eGFR has been calculated using the CKD EPI equation. This calculation has not been validated in all clinical situations. eGFR's persistently <60 mL/min signify possible Chronic Kidney Disease.    Anion gap 11 5 - 15   Dg Chest Port 1 View  04/02/2015   CLINICAL DATA:  Weakness, wounds to left foot, right shoulder but not, decreased temperature  EXAM: PORTABLE CHEST - 1 VIEW  COMPARISON:  03/05/2015  FINDINGS: Cardiac silhouette largely obscured by bilateral airspace disease. Mild cardiac enlargement appears to persist. In addition to vascular congestion and bilateral airspace disease, there appear to be consolidation involving both mid to lower lung zones. Costophrenic angle is blunted.  IMPRESSION: Findings appear consistent with severe pulmonary edema related to congestive heart failure. Significant bilateral pleural effusions with underlying consolidation which may represent atelectasis and edema.    Electronically Signed   By: Skipper Cliche M.D.   On: 03/11/2015 21:05   Dg Foot Complete Right  04/04/2015   CLINICAL DATA:  Right posterior heel ulcer  EXAM: RIGHT FOOT COMPLETE - 3+ VIEW  COMPARISON:  None.  FINDINGS: Negative for fracture, dislocation or radiopaque foreign body. No bony destruction is evident. No soft tissue gas.  IMPRESSION: Negative.   Electronically Signed   By: Andreas Newport M.D.   On: 04/04/2015 00:05    Review Of Systems Constitutional: Negative for diaphoresis.  Respiratory: Positive for shortness of breath.  Cardiovascular: Positive for chest pain.  Gastrointestinal: Negative for nausea.  Musculoskeletal: Positive for myalgias and joint swelling.  All other systems reviewed and are negative.  Blood pressure 122/65, pulse 77, temperature 96.4 F (35.8 C), temperature source Core (Comment), resp. rate 16, height $RemoveBe'5\' 2"'pAOeBawfm$  (1.575 m), weight 59.2 kg (130 lb 8.2 oz), SpO2 98 %.  81 year old female, resting comfortably and in mild respiratory distress.  Head is normocephalic and atraumatic. Brown eyes, Pupil reactive to light, EOMI. Oropharynx is clear. Neck is nontender and supple without adenopathy or JVD. Back is nontender and there is no CVA tenderness. Lungs has bibasilar crackles. Chest is tender on palpation anteriorly. Heart has an irregularly irregular rhythm with II/VI systolic murmur. Abdomen is soft, flat, nontender without masses or hepatosplenomegaly and peristalsis is normoactive. Extremities have 2+ edema, full range of motion is present. Skin is cool and dry without rash. Neurologic: She is somewhat confused and responds with few words or head nodding only. No obvious focal motor or sensory deficits  Assessment/Plan Acute on chronic systolic left heart failure Altered mental status Atrial fibrillation with controlled ventricular response, CHADS2VASC score of 5/9. DM, II Hypertension CAD, multivessel, native vessel CKD,  II Hypothyroidism  Add dobutamine. Adjust levothyroxine dose. Diuresis as tolerated.    Birdie Riddle, MD  04/04/2015, 12:49 PM

## 2015-04-04 NOTE — Progress Notes (Signed)
ANTIBIOTIC CONSULT NOTE - INITIAL  Pharmacy Consult for Vancomycin Indication: pneumonia  No Known Allergies  Patient Measurements: Height: 5\' 2"  (157.5 cm) Weight: 130 lb 8.2 oz (59.2 kg) IBW/kg (Calculated) : 50.1 Adjusted Body Weight:   Vital Signs: Temp: 96.8 F (36 C) (07/27 0500) Temp Source: Core (Comment) (07/27 0143) BP: 103/58 mmHg (07/27 0500) Pulse Rate: 71 (07/27 0500) Intake/Output from previous day: 07/26 0701 - 07/27 0700 In: 1681.8 [I.V.:1481.8; IV Piggyback:200] Out: 30 [Urine:30] Intake/Output from this shift:    Labs:  Recent Labs  03/23/2015 1859 04/04/15 0816  WBC 5.0  --   HGB 11.1*  --   PLT 151  --   CREATININE 2.08* 2.03*   Estimated Creatinine Clearance: 16.9 mL/min (by C-G formula based on Cr of 2.03). No results for input(s): VANCOTROUGH, VANCOPEAK, VANCORANDOM, GENTTROUGH, GENTPEAK, GENTRANDOM, TOBRATROUGH, TOBRAPEAK, TOBRARND, AMIKACINPEAK, AMIKACINTROU, AMIKACIN in the last 72 hours.   Microbiology: Recent Results (from the past 720 hour(s))  MRSA PCR Screening     Status: None   Collection Time: 04/04/15  1:37 AM  Result Value Ref Range Status   MRSA by PCR NEGATIVE NEGATIVE Final    Comment:        The GeneXpert MRSA Assay (FDA approved for NASAL specimens only), is one component of a comprehensive MRSA colonization surveillance program. It is not intended to diagnose MRSA infection nor to guide or monitor treatment for MRSA infections.     Medical History: Past Medical History  Diagnosis Date  . Diabetes mellitus   . Hypertension   . Clostridium difficile colitis 01/2013  . Hiatal hernia   . Hypothyroid   . GERD (gastroesophageal reflux disease)   . Coronary artery disease   . Diverticulitis   . Uterine fibroid   . Shingles   . Diastolic dysfunction     EF 35-40%  . Gout   . Chronic atrial fibrillation   . Renal insufficiency   . Left renal artery stenosis   . CKD (chronic kidney disease), stage II 02/2015      Assessment: 88 yoM admitted last month for ACS, acute on chronic CHF presents with AMS, weakness, and hypotension.  Patient admitted now for possible sepsis 2/2 HCAP.  Lactic acid 2.5, WBC 5, hypothermia, hypotensive, CXR with significant pulmonary edema and possible pneumonia.  Patient also has a right heel and toe ulcer.  MD ordered Ceftazidime and consulted pharmacy to dose vancomycin.  Vancomycin 1g x 1 given in ED early this AM.  7/27 >> Ceftazidime >> 7/27 >> Vancomycin >>  Blood and urine cultures collected.  Today, 04/04/2015:  Temp: low this AM at 96.8  WBC: WNL  Renal: AoCKD, SCr 2.03, CrCl~19 ml/min (CG)  Goal of Therapy:  Vancomycin trough level 15-20 mcg/ml  Doses adjusted per renal function Eradication of infection  Plan:  1.  Vancomycin 750 mg IV q48h.  Next dose 7/29. 2.  Adjust Ceftazidime to 2g IV q24h. 3.  F/u culture results, SCr, trough levels as indicated.  [name] [name] 04/04/2015,9:05 AM

## 2015-04-04 NOTE — Progress Notes (Signed)
CRITICAL VALUE ALERT  Critical value received:  Troponin 0.74  Date of notification:  04/04/2015  Time of notification:  0900  Critical value read back:Yes.    Nurse who received alert:  Gladys Damme, RN  MD notified (1st page):  Rizwan  Time of first page:  0903  MD notified (2nd page):  Time of second page:  Responding MD:  Wynelle Cleveland    Time MD responded:  1000

## 2015-04-04 NOTE — Progress Notes (Signed)
Spoke with daughter, Elwin Sleight, and [place] son, Sonia Side, who both agree that patient and family want patient,[name] [name], to be a DNR.  Dr. Doylene Canard notified by Gladys Damme, RN and stated that he would place order in the chart.  Son, Sonia Side, said MD was already aware. Awaiting orders.

## 2015-04-04 NOTE — Progress Notes (Signed)
During rounds, chaplain notieced family in the waiting area. Chaplain spoke with family and visited with patient and family. Chaplain provided a listening presence. Patient appeared to be in pain and chaplain prayed a prayed of comfort. Chaplain will continue to follow.   04/04/15 1545  Clinical Encounter Type  Visited With Patient and family together  Visit Type Initial;Spiritual support;Social support  Referral From Other (Comment)

## 2015-04-04 NOTE — Progress Notes (Signed)
Decreased urine output. IV dopamine working better than IV dobutamine. Patient was DNR in past and will continue DNR. With right heart failure will try few fluid boluses to improve left heart output. Use very small dose morphine frequently for pain control and comfort. Will get limited echocardiogram.   Birdie Riddle, MD  04/04/2015, 21:59 Hr.

## 2015-04-04 NOTE — Progress Notes (Signed)
OT Cancellation Note  Patient Details Name: Reola Buckles MRN: [id number] DOB: 1931/04/26   Cancelled Treatment:    Reason Eval/Treat Not Completed: Other (comment).  Noted elevated troponins, trending up.    Will check back another time  [facility] 04/04/2015, 10:16 AM  Lesle Chris, OTR/L [phone] 04/04/2015

## 2015-04-04 NOTE — Progress Notes (Signed)
PT Cancellation Note  Patient Details Name: [name] [name] MRN: [id number] DOB: Nov 19, 1930   Cancelled Treatment:    Reason Eval/Treat Not Completed: Other (comment) (on bedrest until 1100AM today as well as elevated troponins and waiting in cardiology consult. will check on this PM for medical clearance for ebval vs posponing until tomorrow.)   Marcelino Freestone PT [phone]  04/04/2015, 8:34 AM

## 2015-04-05 ENCOUNTER — Inpatient Hospital Stay (HOSPITAL_COMMUNITY): Payer: Medicare Other

## 2015-04-05 LAB — CBC WITH DIFFERENTIAL/PLATELET
BASOS ABS: 0 10*3/uL (ref 0.0–0.1)
BASOS PCT: 0 % (ref 0–1)
EOS ABS: 0 10*3/uL (ref 0.0–0.7)
Eosinophils Relative: 1 % (ref 0–5)
HEMATOCRIT: 37 % (ref 36.0–46.0)
Hemoglobin: 13 g/dL (ref 12.0–15.0)
LYMPHS ABS: 0.4 10*3/uL — AB (ref 0.7–4.0)
Lymphocytes Relative: 9 % — ABNORMAL LOW (ref 12–46)
MCH: 30.5 pg (ref 26.0–34.0)
MCHC: 35.1 g/dL (ref 30.0–36.0)
MCV: 86.9 fL (ref 78.0–100.0)
MONO ABS: 0.5 10*3/uL (ref 0.1–1.0)
MONOS PCT: 12 % (ref 3–12)
NEUTROS ABS: 3.3 10*3/uL (ref 1.7–7.7)
Neutrophils Relative %: 79 % — ABNORMAL HIGH (ref 43–77)
Platelets: UNDETERMINED 10*3/uL (ref 150–400)
RBC: 4.26 MIL/uL (ref 3.87–5.11)
RDW: 19.4 % — AB (ref 11.5–15.5)
WBC: 4.2 10*3/uL (ref 4.0–10.5)

## 2015-04-05 LAB — BASIC METABOLIC PANEL
Anion gap: 12 (ref 5–15)
BUN: 24 mg/dL — ABNORMAL HIGH (ref 6–20)
CHLORIDE: 105 mmol/L (ref 101–111)
CO2: 18 mmol/L — AB (ref 22–32)
Calcium: 8.1 mg/dL — ABNORMAL LOW (ref 8.9–10.3)
Creatinine, Ser: 2.12 mg/dL — ABNORMAL HIGH (ref 0.44–1.00)
GFR calc Af Amer: 24 mL/min — ABNORMAL LOW (ref >60)
GFR calc non Af Amer: 21 mL/min — ABNORMAL LOW (ref >60)
Glucose, Bld: 109 mg/dL — ABNORMAL HIGH (ref 65–99)
POTASSIUM: 3.8 mmol/L (ref 3.5–5.1)
SODIUM: 135 mmol/L (ref 135–145)

## 2015-04-05 LAB — URINE CULTURE: CULTURE: NO GROWTH

## 2015-04-05 LAB — DIGOXIN LEVEL: DIGOXIN LVL: 3.1 ng/mL — AB (ref 0.8–2.0)

## 2015-04-05 MED ORDER — LIDOCAINE HCL (PF) 1 % IJ SOLN
INTRAMUSCULAR | Status: AC
  Administered 2015-04-05: 22:00:00
  Filled 2015-04-05: qty 5

## 2015-04-05 MED ORDER — LIDOCAINE HCL (PF) 1 % IJ SOLN
INTRAMUSCULAR | Status: AC
  Administered 2015-04-05: 21:00:00
  Filled 2015-04-05: qty 5

## 2015-04-05 MED ORDER — PANTOPRAZOLE SODIUM 40 MG PO TBEC
40.0000 mg | DELAYED_RELEASE_TABLET | Freq: Every day | ORAL | Status: DC
  Administered 2015-04-06: 40 mg via ORAL
  Filled 2015-04-05 (×2): qty 1

## 2015-04-05 NOTE — Progress Notes (Signed)
CRITICAL VALUE ALERT  Critical value received:  Digoxin Level 3.1  Date of notification:  04/05/2015  Time of notification:  0930  Critical value read back:yes  Nurse who received alert:  Gladys Damme  MD notified (1st page):  Doylene Canard, MD  Time of first page:  0945  MD notified (2nd page):  Time of second page:  Responding MD:  Doylene Canard, MD  Time MD responded:  [phone]

## 2015-04-05 NOTE — Progress Notes (Addendum)
Dr. Doylene Canard aware that phlebotomy was unable to get PTT/Heparin draws. He said he will come and assess patient for access/lab draw.

## 2015-04-05 NOTE — Progress Notes (Signed)
Pharmacist Heart Failure Core Measure Documentation  Assessment: [name] [name] has an EF documented as 35-40% on 11/30/14 by ECHO.  Rationale: Heart failure patients with left ventricular systolic dysfunction (LVSD) and an EF < 40% should be prescribed an angiotensin converting enzyme inhibitor (ACEI) or angiotensin receptor blocker (ARB) at discharge unless a contraindication is documented in the medical record.  This patient is not currently on an ACEI or ARB for HF.  This note is being placed in the record in order to provide documentation that a contraindication to the use of these agents is present for this encounter.  ACE Inhibitor or Angiotensin Receptor Blocker is contraindicated (specify all that apply)  []   ACEI allergy AND ARB allergy []   Angioedema []   Moderate or severe aortic stenosis []   Hyperkalemia [x]   Hypotension [x]   Renal artery stenosis [x]   Worsening renal function, preexisting renal disease or dysfunction   Biagio Borg 04/05/2015 10:04 AM

## 2015-04-05 NOTE — CV Procedure (Signed)
Central Venous Catheter Insertion Procedure Note [name] [name] [id number] 16-Mar-1931  Procedure: Insertion of Central Venous Catheter Indications: Drug and/or fluid administration and Frequent blood sampling  Procedure Details Consent: Unable to obtain consent because of emergent medical necessity. Time Out: Verified patient identification, verified procedure, site/side was marked, verified correct patient position, special equipment/implants available, medications/allergies/relevent history reviewed, required imaging and test results available.  Performed  Maximum sterile technique was used including antiseptics, cap, gloves, gown, hand hygiene, mask and sheet. Skin prep: Chlorhexidine; local anesthetic administered A antimicrobial bonded/coated triple lumen catheter was placed in the right internal jugular vein using the Seldinger technique.  Evaluation Blood flow good Complications: No apparent complications Patient did tolerate procedure well. Chest X-ray ordered to verify placement.  CXR: pending.  [name] [name] S 04/05/2015, 9:46 PM

## 2015-04-05 NOTE — Progress Notes (Signed)
ANTICOAGULATION CONSULT NOTE - Initial Consult  Pharmacy Consult for Heparin Indication: LA thrombus  No Known Allergies  Patient Measurements: Height: 5\' 2"  (157.5 cm) Weight: 130 lb 8.2 oz (59.2 kg) IBW/kg (Calculated) : 50.1 Heparin Dosing Weight: actual weight  Vital Signs: Temp: 96.8 F (36 C) (07/28 1100) Temp Source: Core (Comment) (07/28 1100) BP: 121/70 mmHg (07/28 1100) Pulse Rate: 60 (07/28 1000)  Labs:  Recent Labs  04/01/2015 1859 03/22/2015 2230 04/04/15 0816 04/05/15 0441  HGB 11.1*  --   --  13.0  HCT 32.6*  --   --  37.0  PLT 151  --   --  PLATELET CLUMPS NOTED ON SMEAR, UNABLE TO ESTIMATE  CREATININE 2.08*  --  2.03* 2.12*  TROPONINI 0.57* 0.62* 0.74*  --     Estimated Creatinine Clearance: 16.2 mL/min (by C-G formula based on Cr of 2.12).   Medical History: Past Medical History  Diagnosis Date  . Diabetes mellitus   . Hypertension   . Clostridium difficile colitis 01/2013  . Hiatal hernia   . Hypothyroid   . GERD (gastroesophageal reflux disease)   . Coronary artery disease   . Diverticulitis   . Uterine fibroid   . Shingles   . Diastolic dysfunction     EF 35-40%  . Gout   . Chronic atrial fibrillation   . Renal insufficiency   . Left renal artery stenosis   . CKD (chronic kidney disease), stage II 02/2015    Medications:  Scheduled:  . antiseptic oral rinse  7 mL Mouth Rinse q12n4p  . atorvastatin  10 mg Oral QPM  . cefTAZidime (FORTAZ)  IV  2 g Intravenous Q24H  . chlorhexidine  15 mL Mouth Rinse BID  . cycloSPORINE  1 drop Both Eyes BID  . diltiazem  30 mg Oral Q12H  . donepezil  5 mg Oral Daily  . feeding supplement (ENSURE ENLIVE)  237 mL Oral BID BM  . levothyroxine  100 mcg Oral QAC breakfast  . [START ON 04/06/2015] pantoprazole  40 mg Oral Daily  . sodium chloride  3 mL Intravenous Q12H  . [START ON 04/06/2015] vancomycin  750 mg Intravenous Q48H   Infusions:  . sodium chloride 75 mL/hr at 04/05/15 0200  . DOPamine 5  mcg/kg/min (04/05/15 0751)    Assessment: [name] [name] admitted on 7/26 with AMS, weakness, hypotension with suspected sepsis 2/2 HCAP.  She was recently admitted for ACS, acute on chronic CHF and chronic AFib started on low-dose Eliquis on 12/01/14 per cardiologist.  Echocardiogram on 7/28 shows large LA thrombus and pharmacy is consulted to transition from Eliquis to Heparin IV.  Most recent Eliquis dosing at 2.5mg  BID - last dose administered on 04/04/15 1055.  Patient unable to take PO meds 7/27 PM and pt/family rfused administration on 7/28 AM.  SCr is elevated and increased to 2.12, CrCl ~ 16 ml/min.  Poor renal function will increase systemic exposure to Eliquis and will prolong time to clearance.  Hgb 13, stable. Platelets clumped on CBC 7/28.  Platelets most recently 151 on 7/26, but prior to that they were low in 30-40's 02/2015. No s/s bleeding reported.  Initial heparin orders delayed - phlebotomy was unable to get PTT/Heparin draws and MD was consulted to place central line.  CVC placed 7/28 at 2146.  Baseline coags pending.  Goal of Therapy:  Heparin level 0.3-0.7 units/ml aPTT 66-102 seconds Monitor platelets by anticoagulation protocol: Yes   Plan:   Baseline aPTT, Heparin level  No Heparin bolus  Start heparin IV infusion at 850 units/hr if baseline labs WNL  Heparin level, aPTT 8 hours after starting  Daily heparin level and CBC  Continue to monitor H&H and platelets   Gretta Arab PharmD, BCPS Pager [phone] 04/05/2015 4:32 PM

## 2015-04-05 NOTE — Evaluation (Signed)
Physical Therapy Evaluation Patient Details Name: [name] [name] MRN: [id number] DOB: [birth year] Today's Date: 04/05/2015   History of Present Illness  pt was admitted for acute encephalopathy and weakness.  She has a PMH significant for DM, HTN, CAD, diastolic dysfunction and renal insufficiency, positve for large LA thrombus, to start Heparin.  Clinical Impression  Very limited evaluation, patient is very stiff. Cardiology note indicates that patient has a LA thrombus. No family present to learn Prior function. Will provide trial PT  To address problems listed in note below.    Follow Up Recommendations SNF;Supervision/Assistance - 24 hour    Equipment Recommendations  None recommended by PT    Recommendations for Other Services       Precautions / Restrictions Precautions Precautions: Fall Restrictions Weight Bearing Restrictions: No      Mobility  Bed Mobility Overal bed mobility: Needs Assistance Bed Mobility: Rolling Rolling: Total assist;+2 for physical assistance         General bed mobility comments: pt did not assist, very rigid to roll.  Transfers                 General transfer comment: not attempted  Ambulation/Gait                Stairs            Wheelchair Mobility    Modified Rankin (Stroke Patients Only)       Balance                                             Pertinent Vitals/Pain Pain Assessment: Faces Faces Pain Scale: Hurts little more Pain Location: headache and moving extremeties. Pain Descriptors / Indicators: Discomfort;Grimacing Pain Intervention(s): Limited activity within patient's tolerance    Home Living Family/patient expects to be discharged to:: Unsure Living Arrangements: Children                    Prior Function Level of Independence: Needs assistance         Comments: pt poor historian; unsure of how much assistance she was getting     Hand Dominance         Extremity/Trunk Assessment   Upper Extremity Assessment: Generalized weakness (AAROM to lift both to 45 degrees; stiffness present)           Lower Extremity Assessment: RLE deficits/detail;LLE deficits/detail RLE Deficits / Details: unable to flex hips or knees. LLE Deficits / Details: same,      Communication   Communication: HOH  Cognition Arousal/Alertness: Awake/alert Behavior During Therapy: WFL for tasks assessed/performed Overall Cognitive Status: Impaired/Different from baseline Area of Impairment: Orientation               General Comments: at times patient would respond with appropriate words, genearlly did not follow.    General Comments      Exercises        Assessment/Plan    PT Assessment Patient needs continued PT services  PT Diagnosis Generalized weakness;Altered mental status   PT Problem List Decreased strength;Decreased range of motion;Decreased activity tolerance;Decreased mobility;Pain;Decreased skin integrity  PT Treatment Interventions Functional mobility training;Therapeutic activities;Therapeutic exercise;Patient/family education   PT Goals (Current goals can be found in the Care Plan section) Acute Rehab PT Goals Patient Stated Goal: none stated PT Goal Formulation: Patient unable to participate in goal setting Time For  Goal Achievement: 04/19/15    Frequency Min 2X/week   Barriers to discharge   no family present to know prior  caregivers. apparantly receiving HHPT as ED note indicated PT notified the  MD about change in status.    Co-evaluation PT/OT/SLP Co-Evaluation/Treatment: Yes Reason for Co-Treatment: Complexity of the patient's impairments (multi-system involvement) PT goals addressed during session: Mobility/safety with mobility OT goals addressed during session: ADL's and self-care       End of Session   Activity Tolerance: Patient limited by pain Patient left: in bed;with call bell/phone within reach;with  bed alarm set Nurse Communication: Mobility status         Time: 2585-2778 PT Time Calculation (min) (ACUTE ONLY): 19 min   Charges:         PT G CodesClaretha Cooper 04/05/2015, 5:00 PM [name] PT [phone] '

## 2015-04-05 NOTE — Progress Notes (Signed)
Ref: Leola Brazil, MD   Subjective:  In aches and pain. Poor urine output. Family confirms DNR. Echocardiogram positive for Large LA thrombus. Elevated Dig. Level.  Objective:  Vital Signs in the last 24 hours: Temp:  [96.1 F (35.6 C)-98.1 F (36.7 C)] 96.8 F (36 C) (07/28 1100) Pulse Rate:  [60-95] 60 (07/28 1000) Cardiac Rhythm:  [-] Atrial fibrillation (07/28 0800) Resp:  [4-22] 11 (07/28 1100) BP: (67-132)/(30-92) 121/70 mmHg (07/28 1100) SpO2:  [86 %-98 %] 92 % (07/28 1000)  Physical Exam: BP Readings from Last 1 Encounters:  04/05/15 121/70    Wt Readings from Last 1 Encounters:  04/04/15 59.2 kg (130 lb 8.2 oz)    Weight change:   HEENT: [place]/AT, Eyes-Brown, PERL, EOMI, Conjunctiva-Pink, Sclera-Non-icteric Neck: + JVD, No bruit, Trachea midline. Lungs:  Crackles, Bilateral. Cardiac:  Irregular rhythm, normal S1 and S2, no S3. II/VI systolic murmur. Abdomen:  Soft, epigastirc-tenderness. Extremities:  2 + edema present. No cyanosis. No clubbing. CNS: AxOx1, Cranial nerves grossly intact, moves all 4 extremities. Right handed. Skin: Warm and dry.   Intake/Output from previous day: 07/27 0701 - 07/28 0700 In: 2200.1 [I.V.:1950.1] Out: 110 [Urine:60]    Lab Results: BMET    Component Value Date/Time   NA 135 04/05/2015 0441   NA 133* 04/04/2015 0816   NA 133* 03/19/2015 1859   K 3.8 04/05/2015 0441   K 5.0 04/04/2015 0816   K 3.7 03/26/2015 1859   CL 105 04/05/2015 0441   CL 107 04/04/2015 0816   CL 101 03/17/2015 1859   CO2 18* 04/05/2015 0441   CO2 15* 04/04/2015 0816   CO2 19* 04/06/2015 1859   GLUCOSE 109* 04/05/2015 0441   GLUCOSE 104* 04/04/2015 0816   GLUCOSE 124* 03/13/2015 1859   BUN 24* 04/05/2015 0441   BUN 25* 04/04/2015 0816   BUN 25* 03/12/2015 1859   CREATININE 2.12* 04/05/2015 0441   CREATININE 2.03* 04/04/2015 0816   CREATININE 2.08* 03/19/2015 1859   CALCIUM 8.1* 04/05/2015 0441   CALCIUM 7.9* 04/04/2015 0816   CALCIUM  8.2* 03/24/2015 1859   GFRNONAA 21* 04/05/2015 0441   GFRNONAA 22* 04/04/2015 0816   GFRNONAA 21* 03/13/2015 1859   GFRAA 24* 04/05/2015 0441   GFRAA 25* 04/04/2015 0816   GFRAA 24* 04/04/2015 1859   CBC    Component Value Date/Time   WBC 4.2 04/05/2015 0441   RBC 4.26 04/05/2015 0441   HGB 13.0 04/05/2015 0441   HCT 37.0 04/05/2015 0441   PLT PLATELET CLUMPS NOTED ON SMEAR, UNABLE TO ESTIMATE 04/05/2015 0441   MCV 86.9 04/05/2015 0441   MCH 30.5 04/05/2015 0441   MCHC 35.1 04/05/2015 0441   RDW 19.4* 04/05/2015 0441   LYMPHSABS 0.4* 04/05/2015 0441   MONOABS 0.5 04/05/2015 0441   EOSABS 0.0 04/05/2015 0441   BASOSABS 0.0 04/05/2015 0441   HEPATIC Function Panel  Recent Labs  03/05/15 0630 03/20/2015 1859  PROT 6.1* 5.0*   HEMOGLOBIN A1C No components found for: HGA1C,  MPG CARDIAC ENZYMES Lab Results  Component Value Date   CKTOTAL 381* 01/20/2012   CKMB 9.1* 01/20/2012   TROPONINI 0.74* 04/04/2015   TROPONINI 0.62* 03/27/2015   TROPONINI 0.57* 03/21/2015   BNP No results for input(s): PROBNP in the last 8760 hours. TSH  Recent Labs  03/08/15 1213  TSH 66.617*   CHOLESTEROL  Recent Labs  03/06/15 0818  CHOL 278*    Scheduled Meds: . antiseptic oral rinse  7 mL Mouth Rinse  q12n4p  . atorvastatin  10 mg Oral QPM  . cefTAZidime (FORTAZ)  IV  2 g Intravenous Q24H  . chlorhexidine  15 mL Mouth Rinse BID  . cycloSPORINE  1 drop Both Eyes BID  . diltiazem  30 mg Oral Q12H  . donepezil  5 mg Oral Daily  . feeding supplement (ENSURE ENLIVE)  237 mL Oral BID BM  . levothyroxine  100 mcg Oral QAC breakfast  . [START ON 04/06/2015] pantoprazole  40 mg Oral Daily  . sodium chloride  3 mL Intravenous Q12H  . [START ON 04/06/2015] vancomycin  750 mg Intravenous Q48H   Continuous Infusions: . sodium chloride 75 mL/hr at 04/05/15 0200  . DOPamine 5 mcg/kg/min (04/05/15 0751)   PRN Meds:.acetaminophen **OR** acetaminophen, allopurinol, morphine injection,  ondansetron **OR** ondansetron (ZOFRAN) IV  Assessment/Plan: Acute on chronic Biventricular systolic heart failure Acute LA thrombus Acute digitalis toxicity Altered mental status Atrial fibrillation with controlled ventricular response, CHADS2VASC score of 5/9. DM, II Hypertension CAD, multivessel, native vessel CKD, II Hypothyroidism  DC digoxin. DC Eliquis Start IV heparin. Continue medical treatment.   LOS: 1 day    Dixie Dials  MD  04/05/2015, 4:28 PM

## 2015-04-05 NOTE — Progress Notes (Signed)
  Echocardiogram 2D Echocardiogram has been performed.  [name] [name] 04/05/2015, 11:17 AM

## 2015-04-05 NOTE — Evaluation (Signed)
Occupational Therapy Evaluation Patient Details Name: [name] [name] MRN: [id number] DOB: 09-Jul-1931 Today's Date: 04/05/2015    History of Present Illness pt was admitted for acute encephalopathy and weakness.  She has a PMH significant for DM, HTN, CAD, diastolic dysfunction and renal insufficiency   Clinical Impression   This 81 year old female was admitted for the above.  PLOF for ADLs is unknown, however, pt was participating in mobility with PT on last admission in June, 2016. Will trial OT with initial goals of grooming and sitting tolerance, and will upgrade as appropriate.  Pt currently needed max A to wash face with assistance to initiate.  She was total A +2 to roll.    Follow Up Recommendations  Supervision/Assistance - 24 hour;SNF (vs, depending on family's ability to assist her.)    Equipment Recommendations  None recommended by OT    Recommendations for Other Services       Precautions / Restrictions Precautions Precautions: Fall Restrictions Weight Bearing Restrictions: No      Mobility Bed Mobility Overal bed mobility: Needs Assistance Bed Mobility: Rolling Rolling: Total assist;+2 for physical assistance         General bed mobility comments: pt did not assist  Transfers                 General transfer comment: not attempted    Balance                                            ADL Overall ADL's : Needs assistance/impaired     Grooming: Maximal assistance;Wash/dry face                                 General ADL Comments: Assisted pt in lifting RUE to bring washcloth to mouth:  she perseverated on washing around mouth.  Pt needs total A for all other ADLs; +2 A to roll for LB adls.  Did not transfer this session; total A x 2 for rolling, pt 0%.  Did not assess self-feeding     Vision     Perception     Praxis      Pertinent Vitals/Pain Pain Assessment: Faces Faces Pain Scale: Hurts little  more Pain Location: headache Pain Descriptors / Indicators: Aching Pain Intervention(s): Limited activity within patient's tolerance;Monitored during session;Patient requesting pain meds-RN notified     Hand Dominance     Extremity/Trunk Assessment Upper Extremity Assessment Upper Extremity Assessment: Generalized weakness (AAROM to lift both to 45 degrees; stiffness present)           Communication Communication Communication: HOH   Cognition Arousal/Alertness: Awake/alert Behavior During Therapy: WFL for tasks assessed/performed Overall Cognitive Status: Impaired/Different from baseline Area of Impairment: Orientation               General Comments: pt admitted with acute encephalopathy.  Pt oriented to self, extra time to follow one step commands and sometimes did opposite of what was asked (ie opening eyes wide instead of closing when assisting with washing face).  Pt perseverated on washing mouth   General Comments       Exercises       Shoulder Instructions      Home Living Family/patient expects to be discharged to:: Unsure Living Arrangements: Children  Prior Functioning/Environment Level of Independence: Needs assistance        Comments: pt poor historian; unsure of how much assistance she was getting    OT Diagnosis: Cognitive deficits;Generalized weakness;Acute pain   OT Problem List:     OT Treatment/Interventions:      OT Goals(Current goals can be found in the care plan section)    OT Frequency:     Barriers to D/C:            Co-evaluation PT/OT/SLP Co-Evaluation/Treatment: Yes Reason for Co-Treatment: For patient/therapist safety   OT goals addressed during session: ADL's and self-care      End of Session    Activity Tolerance:   Patient left: in bed;with call bell/phone within reach   Time: 1502-1520 OT Time Calculation (min): 18 min Charges:  OT General Charges $OT  Visit: 1 Procedure OT Evaluation $Initial OT Evaluation Tier I: 1 Procedure G-Codes:    Kalvyn Desa 2015-04-10, 4:12 PM   Lesle Chris, OTR/L [phone] 10-Apr-2015

## 2015-04-06 LAB — BASIC METABOLIC PANEL
ANION GAP: 9 (ref 5–15)
BUN: 25 mg/dL — ABNORMAL HIGH (ref 6–20)
CALCIUM: 8 mg/dL — AB (ref 8.9–10.3)
CHLORIDE: 109 mmol/L (ref 101–111)
CO2: 19 mmol/L — AB (ref 22–32)
Creatinine, Ser: 2.05 mg/dL — ABNORMAL HIGH (ref 0.44–1.00)
GFR calc non Af Amer: 21 mL/min — ABNORMAL LOW (ref >60)
GFR, EST AFRICAN AMERICAN: 25 mL/min — AB (ref >60)
Glucose, Bld: 88 mg/dL (ref 65–99)
POTASSIUM: 3.5 mmol/L (ref 3.5–5.1)
Sodium: 137 mmol/L (ref 135–145)

## 2015-04-06 LAB — HEPARIN LEVEL (UNFRACTIONATED)
Heparin Unfractionated: >2.2 IU/mL — ABNORMAL HIGH (ref 0.30–0.70)
Heparin Unfractionated: >2.2 IU/mL — ABNORMAL HIGH (ref 0.30–0.70)

## 2015-04-06 LAB — APTT: APTT: 35 s (ref 24–37)

## 2015-04-06 LAB — DIGOXIN LEVEL: Digoxin Level: 2.7 ng/mL (ref 0.8–2.0)

## 2015-04-06 MED ORDER — HEPARIN (PORCINE) IN NACL 100-0.45 UNIT/ML-% IJ SOLN
450.0000 [IU]/h | INTRAMUSCULAR | Status: DC
  Filled 2015-04-06: qty 250

## 2015-04-06 MED ORDER — HEPARIN (PORCINE) IN NACL 100-0.45 UNIT/ML-% IJ SOLN
850.0000 [IU]/h | INTRAMUSCULAR | Status: DC
  Administered 2015-04-06: 850 [IU]/h via INTRAVENOUS
  Filled 2015-04-06: qty 250

## 2015-04-06 NOTE — Progress Notes (Signed)
CRITICAL VALUE ALERT  Critical value received:  Digoxin 2.7  Date of notification:  04/06/15  Time of notification:  0400  Critical value read back:Yes.    Nurse who received alert:  Reche Dixon  MD notified (1st page):  Doylene Canard  Time of first page:  0430  MD notified (2nd page):  Time of second page:  Responding MD:  Doylene Canard  Time MD responded:  2929

## 2015-04-06 NOTE — Progress Notes (Signed)
ANTICOAGULATION CONSULT NOTE   Pharmacy Consult for Heparin Indication: LA thrombus  No Known Allergies  Patient Measurements: Height: 5\' 2"  (157.5 cm) Weight: 130 lb 8.2 oz (59.2 kg) IBW/kg (Calculated) : 50.1 Heparin Dosing Weight: actual weight  Vital Signs: Temp: 99.1 F (37.3 C) (07/29 1200) Temp Source: Oral (07/29 0800) BP: 101/52 mmHg (07/29 1200) Pulse Rate: 88 (07/29 1200)  Labs:  Recent Labs  04/06/2015 1859 03/10/2015 2230 04/04/15 0816 04/05/15 0441 04/05/15 1701 04/06/15 0405 04/06/15 1100  HGB 11.1*  --   --  13.0  --   --   --   HCT 32.6*  --   --  37.0  --   --   --   PLT 151  --   --  PLATELET CLUMPS NOTED ON SMEAR, UNABLE TO ESTIMATE  --   --   --   APTT  --   --   --   --  35  --  >200*  HEPARINUNFRC  --   --   --   --  >2.20*  --  >2.20*  CREATININE 2.08*  --  2.03* 2.12*  --  2.05*  --   TROPONINI 0.57* 0.62* 0.74*  --   --   --   --    Estimated Creatinine Clearance: 16.7 mL/min (by C-G formula based on Cr of 2.05).  Medical History: Past Medical History  Diagnosis Date  . Diabetes mellitus   . Hypertension   . Clostridium difficile colitis 01/2013  . Hiatal hernia   . Hypothyroid   . GERD (gastroesophageal reflux disease)   . Coronary artery disease   . Diverticulitis   . Uterine fibroid   . Shingles   . Diastolic dysfunction     EF 35-40%  . Gout   . Chronic atrial fibrillation   . Renal insufficiency   . Left renal artery stenosis   . CKD (chronic kidney disease), stage II 02/2015   Medications:  Scheduled:  . antiseptic oral rinse  7 mL Mouth Rinse q12n4p  . atorvastatin  10 mg Oral QPM  . cefTAZidime (FORTAZ)  IV  2 g Intravenous Q24H  . chlorhexidine  15 mL Mouth Rinse BID  . cycloSPORINE  1 drop Both Eyes BID  . diltiazem  30 mg Oral Q12H  . donepezil  5 mg Oral Daily  . feeding supplement (ENSURE ENLIVE)  237 mL Oral BID BM  . levothyroxine  100 mcg Oral QAC breakfast  . pantoprazole  40 mg Oral Daily  . sodium  chloride  3 mL Intravenous Q12H  . vancomycin  750 mg Intravenous Q48H   Infusions:  . sodium chloride 75 mL/hr at 04/06/15 1200  . DOPamine 5.045 mcg/kg/min (04/06/15 1200)  . heparin 850 Units/hr (04/06/15 1200)   Assessment: 80 yoF admitted on 7/26 with AMS, weakness, hypotension with suspected sepsis 2/2 HCAP.  She was recently admitted for ACS, acute on chronic CHF and chronic AFib started on low-dose Eliquis on 12/01/14 per cardiologist.  Echocardiogram on 7/28 shows large LA thrombus and pharmacy is consulted to transition from Eliquis to Heparin IV.  Most recent Eliquis dosing at 2.5mg  BID - last dose administered on 04/04/15 1055.  Patient unable to take PO meds 7/27 PM and pt/family refused administration on 7/28 AM.  SCr is elevated and increased to 2.12, CrCl ~ 16 ml/min.  Poor renal function will increase systemic exposure to Eliquis and will prolong time to clearance.  Hgb 13, stable. Platelets clumped on  CBC 7/28.  Platelets most recently 151 on 7/26, but prior to that they were low in 30-40's 02/2015. No s/s bleeding reported.  Initial heparin orders delayed - phlebotomy was unable to get PTT/Heparin draws and MD was consulted to place central line.  CVC placed 7/28 at 2146.  Heparin infusion begun at 850 units/hr at 0146 this morning. Baseline Heparin level elevated at > 2.2 as expected with Eliquis administration, aPTT was 35 seconds (below therapeutic range) so Heparin was initiated with no bolus.  First Heparin level and aPTT both elevated at > 2.2 and > 200 sec respectively, above goal range  Goal of Therapy:  Heparin level 0.3-0.7 units/ml aPTT 66-102 seconds Monitor platelets by anticoagulation protocol: Yes   Plan:   Hold Heparin x 2 hours  Resume Heparin at 1500 at 450 units/hr  Heparin level, aPTT 8 hours after resuming  Daily heparin level and CBC  Continue to monitor H&H and platelets  Minda Ditto PharmD Pager [phone] 04/06/2015, 1:19  PM

## 2015-04-06 NOTE — Care Management Important Message (Signed)
Important Message  Patient Details  Name: [name] [name] MRN: [id number] Date of Birth: 02-15-1931   Medicare Important Message Given:  [facility] notification given    Camillo Flaming 04/06/2015, 12:13 [place] Message  Patient Details  Name: [name] [name] MRN: [id number] Date of Birth: 01-Nov-1930   Medicare Important Message Given:  Yes-second notification given    Camillo Flaming 04/06/2015, 12:13 PM

## 2015-04-06 NOTE — Progress Notes (Signed)
During rounds, chaplain attempted to visited patient. Chaplain provided a presence; patient was asleep. Chaplain will continue to follow.   04/06/15 1400  Clinical Encounter Type  Visited With Patient  Visit Type Follow-up;Spiritual support;Social support  Referral From Other (Comment)

## 2015-04-06 NOTE — Progress Notes (Signed)
Ref: [name],[name], MD   Subjective:  Not communicationg but opens eye and follows. T max 99.7 degree F.  Objective:  Vital Signs in the last 24 hours: Temp:  [96.3 F (35.7 C)-99.7 F (37.6 C)] 99.5 F (37.5 C) (07/29 1800) Pulse Rate:  [85-94] 87 (07/29 1800) Cardiac Rhythm:  [-] Atrial fibrillation (07/29 0800) Resp:  [10-23] 13 (07/29 1800) BP: (73-120)/(33-74) 113/58 mmHg (07/29 1800) SpO2:  [86 %-99 %] 91 % (07/29 1800)  Physical Exam: BP Readings from Last 1 Encounters:  04/06/15 113/58    Wt Readings from Last 1 Encounters:  04/04/15 59.2 kg (130 lb 8.2 oz)    Weight change:   HEENT: [place]/AT, Eyes-Brown, PERL, EOMI, Conjunctiva-Pink, Sclera-Non-icteric Neck: + JVD, No bruit, Trachea midline. Lungs:  Clearing, Bilateral. Cardiac:  Regular rhythm, normal S1 and S2, no S3. II/VI systolic murmur Abdomen:  Soft, non-tender. Extremities:  2 + edema present. No cyanosis. No clubbing. CNS: AxOx3, Cranial nerves grossly intact, moves all 4 extremities. Right handed. Skin: Warm and dry.   Intake/Output from previous day: 07/28 0701 - 07/29 0700 In: 2177.4 [I.V.:1977.4; IV Piggyback:200] Out: 95 [Urine:95]    Lab Results: BMET    Component Value Date/Time   NA 137 04/06/2015 0405   NA 135 04/05/2015 0441   NA 133* 04/04/2015 0816   K 3.5 04/06/2015 0405   K 3.8 04/05/2015 0441   K 5.0 04/04/2015 0816   CL 109 04/06/2015 0405   CL 105 04/05/2015 0441   CL 107 04/04/2015 0816   CO2 19* 04/06/2015 0405   CO2 18* 04/05/2015 0441   CO2 15* 04/04/2015 0816   GLUCOSE 88 04/06/2015 0405   GLUCOSE 109* 04/05/2015 0441   GLUCOSE 104* 04/04/2015 0816   BUN 25* 04/06/2015 0405   BUN 24* 04/05/2015 0441   BUN 25* 04/04/2015 0816   CREATININE 2.05* 04/06/2015 0405   CREATININE 2.12* 04/05/2015 0441   CREATININE 2.03* 04/04/2015 0816   CALCIUM 8.0* 04/06/2015 0405   CALCIUM 8.1* 04/05/2015 0441   CALCIUM 7.9* 04/04/2015 0816   GFRNONAA 21* 04/06/2015 0405    GFRNONAA 21* 04/05/2015 0441   GFRNONAA 22* 04/04/2015 0816   GFRAA 25* 04/06/2015 0405   GFRAA 24* 04/05/2015 0441   GFRAA 25* 04/04/2015 0816   CBC    Component Value Date/Time   WBC 4.2 04/05/2015 0441   RBC 4.26 04/05/2015 0441   HGB 13.0 04/05/2015 0441   HCT 37.0 04/05/2015 0441   PLT PLATELET CLUMPS NOTED ON SMEAR, UNABLE TO ESTIMATE 04/05/2015 0441   MCV 86.9 04/05/2015 0441   MCH 30.5 04/05/2015 0441   MCHC 35.1 04/05/2015 0441   RDW 19.4* 04/05/2015 0441   LYMPHSABS 0.4* 04/05/2015 0441   MONOABS 0.5 04/05/2015 0441   EOSABS 0.0 04/05/2015 0441   BASOSABS 0.0 04/05/2015 0441   HEPATIC Function Panel  Recent Labs  03/05/15 0630 03/12/2015 1859  PROT 6.1* 5.0*   HEMOGLOBIN A1C No components found for: HGA1C,  MPG CARDIAC ENZYMES Lab Results  Component Value Date   CKTOTAL 381* 01/20/2012   CKMB 9.1* 01/20/2012   TROPONINI 0.74* 04/04/2015   TROPONINI 0.62* 04/02/2015   TROPONINI 0.57* 03/13/2015   BNP No results for input(s): PROBNP in the last 8760 hours. TSH  Recent Labs  03/08/15 1213  TSH 66.617*   CHOLESTEROL  Recent Labs  03/06/15 0818  CHOL 278*    Scheduled Meds: . antiseptic oral rinse  7 mL Mouth Rinse q12n4p  . atorvastatin  10 mg Oral QPM  . cefTAZidime (FORTAZ)  IV  2 g Intravenous Q24H  . chlorhexidine  15 mL Mouth Rinse BID  . cycloSPORINE  1 drop Both Eyes BID  . diltiazem  30 mg Oral Q12H  . donepezil  5 mg Oral Daily  . feeding supplement (ENSURE ENLIVE)  237 mL Oral BID BM  . levothyroxine  100 mcg Oral QAC breakfast  . pantoprazole  40 mg Oral Daily  . sodium chloride  3 mL Intravenous Q12H  . vancomycin  750 mg Intravenous Q48H   Continuous Infusions: . sodium chloride 75 mL/hr at 04/06/15 1800  . DOPamine 5.045 mcg/kg/min (04/06/15 1800)  . heparin 450 Units/hr (04/06/15 1800)   PRN Meds:.acetaminophen **OR** acetaminophen, allopurinol, morphine injection, ondansetron **OR** ondansetron (ZOFRAN)  IV  Assessment/Plan: Acute on chronic Biventricular systolic heart failure Acute LA thrombus Acute digitalis toxicity Altered mental status Atrial fibrillation with controlled ventricular response, CHADS2VASC score of 5/9. DM, II Hypertension CAD, multivessel, native vessel CKD, II Hypothyroidism  Continue IV heparin. Family request comfort care. Transfer to med-surgical unit.   LOS: 2 days    Dixie Dials  MD  04/06/2015, 7:13 PM

## 2015-04-06 NOTE — Progress Notes (Signed)
While rounding, chaplain visited patient. Chaplain provided a presence as well as support. Chaplain prayed a silent prayer for patient. Chaplain will continue to follow.   04/05/15 1100  Clinical Encounter Type  Visited With Patient  Visit Type Follow-up;Spiritual support;Social support  Referral From Other (Comment)

## 2015-04-07 LAB — APTT
APTT: 192 s — AB (ref 24–37)
aPTT: 139 seconds — ABNORMAL HIGH (ref 24–37)

## 2015-04-07 LAB — HEPARIN LEVEL (UNFRACTIONATED)
Heparin Unfractionated: 2.02 IU/mL — ABNORMAL HIGH (ref 0.30–0.70)
Heparin Unfractionated: 1.96 IU/mL — ABNORMAL HIGH (ref 0.30–0.70)

## 2015-04-07 LAB — GLUCOSE, CAPILLARY
Glucose-Capillary: 68 mg/dL (ref 65–99)
Glucose-Capillary: 79 mg/dL (ref 65–99)

## 2015-04-07 MED ORDER — LORAZEPAM 2 MG/ML IJ SOLN
0.5000 mg | Freq: Once | INTRAMUSCULAR | Status: AC
  Administered 2015-04-07: 0.5 mg via INTRAVENOUS
  Filled 2015-04-07: qty 1

## 2015-04-07 MED ORDER — HEPARIN (PORCINE) IN NACL 100-0.45 UNIT/ML-% IJ SOLN
350.0000 [IU]/h | INTRAMUSCULAR | Status: DC
  Filled 2015-04-07: qty 250

## 2015-04-07 MED ORDER — DEXTROSE-NACL 5-0.45 % IV SOLN
INTRAVENOUS | Status: DC
  Administered 2015-04-07: 11:00:00 via INTRAVENOUS

## 2015-04-07 MED ORDER — HEPARIN (PORCINE) IN NACL 100-0.45 UNIT/ML-% IJ SOLN
250.0000 [IU]/h | INTRAMUSCULAR | Status: DC
  Filled 2015-04-07: qty 250

## 2015-04-09 NOTE — Progress Notes (Signed)
Pharmacy - Antibiotic Dosing  Assessment: 84 yoF on vancomycin and ceftazidime for sepsis/HCAP.  Has declined recently with refractory hypotension.  Temp/WBC stable wnl; CrCl stable around 20 ml/min CG.  Spoke with physician; would like to continue antibiotics for 24 more hrs, then reassess per [place] decisions  Plan: Continue IV vancomycin 750 mg IV q48 hr Continue Ceftazidime 2 g q24 hr F/u plans for narrowing; goals of care  Reuel Boom, PharmD, BCPS Pager: [phone] 2015/04/21, 1:25 PM

## 2015-04-09 NOTE — Progress Notes (Signed)
MD paged regarding BP 62/38 despite addition of fluids and continuation of dopamine drip.  Pt still alert but not oriented with incomprehensible speech.  Having difficulty obtaining O2 sats, fingers and toes cold and sats won't read.  Legs appear to have mottling. Attempted to warm up hands with warm pack and obtained a reading of 42% on RA, O2 placed at 2L with no change in sats.  Per MD, will not change anything at this time, appears patient is declining.  Per MD will make patient comfortable and if necessary can call back for orders for morphine drip for comfort.  MD states no need to recheck vital signs at this time.

## 2015-04-09 NOTE — Discharge Summary (Signed)
Physician Death Summary  Patient ID: [name] [name] MRN: [id number] DOB/AGE: [birth year] 81 y.o.  Admit date: 03/14/2015 Discharge/Expired date: 05/07/2015  Admission Diagnoses: Acute on chronic Biventricular systolic heart failure Acute LA thrombus Acute digitalis toxicity Altered mental status Atrial fibrillation with controlled ventricular response, CHADS2VASC score of 5/9. DM, II Hypertension CAD, multivessel, native vessel CKD, II Hypothyroidism  Discharge Diagnoses:  Principal Problem: * Acute encephalopathy * Active Problems: Acute on chronic Biventricular systolic heart failure Acute LA thrombus Acute digitalis toxicity Atrial fibrillation with controlled ventricular response, CHADS2VASC score of 5/9. DM, II Hypertension CAD, multivessel, native vessel CKD, II Hypothyroidism Protein-calorie malnutrition, severe  Discharged Condition: Expired  Hospital Course: 81 year old female admitted with altered mental status, hypothermia and hypotension. She was treated with broad spectrum antibiotics, IV fluids and IV dopamine for hypotension and [place]ger for hypothermia. She also developed Acute on chronic Biventricular systolic heart failure, Acute LA thrombus and  Acute digitalis toxicity. She was stabilized marginally with aggressive treatment. I recommended DNR and Family accepted. Her antibiotics were continued she was on IV heparin for large LA thrombus. She became hypothermic and hypotensive, made comfort care and was pronounced dead on 07-May-2015 at [place] by nurse Nancy Marus and Clotilde Dieter and appropriate disposition to funeral home was arranged with family's help.  Consults: cardiology  Significant Diagnostic Studies: labs: BNP was 656. Low Hgb of 11.1 to 13.0, normal WBC and Platelets count. Minimally elevated Troponin-I's. Elevated BUN/Cr. Borderline lactic acid level. Dig level of 3.1 followed by 2.7  EKG-atrial fibrillation with low voltage.  Chest X-ray: 1.  Tip of the right central line in the mid distal SVC. No pneumothorax. 2. Increased right pleural effusion. Unchanged left pleural effusion. Associated bibasilar opacities may reflect compressive atelectasis. Slight improvement in vascular congestion.  Echocardiogram: -Left ventricle: The cavity size was normal. There was severe concentric hypertrophy. Systolic function was moderately to severely reduced. The estimated ejection fraction was in the range of 30% to 35%. Diffuse hypokinesis. Doppler parameters are consistent with abnormal left ventricular relaxation (grade 1 diastolic dysfunction). The outflow tract showed a velocity flow profile with a late systolic peak, typical of dynamic obstruction. - Aortic valve: Cannot exclude vegetation. There was mild regurgitation. - Mitral valve: Calcified annulus. There was mild regurgitation. - Left atrium: The atrium was severely dilated. There was an apparent, large, spherical, loosely organized thrombus in the high atrial cavity. - Right ventricle: The cavity size was mildly dilated. Wall thickness was normal. Systolic function was moderately reduced. - Right atrium: The atrium was moderately dilated. - Tricuspid valve: There was severe regurgitation. - Pericardium, extracardiac: There was a right pleural effusion.  Treatments: IV Fortaz, Vancomycin, Levothyroxin, heparin and saline fluid boluses.  Discharge Exam: Blood pressure 0, pulse 0, temperature 97.3 F (35.8 C), temperature source Axillary, resp. rate 0, height 5\' 2"  (1.575 m), weight 59 kg (130 lb 1.1 oz), SpO2 72 %. HEENT: [place]/AT, Eyes-Brown, Conjunctiva-Pink, Sclera-Non-icteric Neck: + JVD, No bruit, Trachea midline. Lungs: Absent breath souds. Cardiac:Absent rhythm and heart sounds. Abdomen: Soft. Extremities: 2 + edema present.  CNS: AxOx0  Skin: Cool and dry.  Disposition: -Funeral Home     Medication List: None    ASK your doctor about these medications         allopurinol 100 MG tablet  Commonly known as:  ZYLOPRIM  Take 100 mg by mouth daily as needed (for gout).     apixaban 2.5 MG Tabs tablet  Commonly known as:  ELIQUIS  Take 1 tablet (2.5 mg total) by mouth 2 (two) times daily.     atorvastatin 10 MG tablet  Commonly known as:  LIPITOR  Take 1 tablet (10 mg total) by mouth every evening.     cycloSPORINE 0.05 % ophthalmic emulsion  Commonly known as:  RESTASIS  Place 1 drop into both eyes 2 (two) times daily.     digoxin 0.125 MG tablet  Commonly known as:  LANOXIN  Take 0.5 tablets (0.0625 mg total) by mouth daily.     diltiazem 30 MG tablet  Commonly known as:  CARDIZEM  Take 1 tablet (30 mg total) by mouth every 12 (twelve) hours.     donepezil 5 MG tablet  Commonly known as:  ARICEPT  Take 5 mg by mouth daily.     levothyroxine 75 MCG tablet  Commonly known as:  SYNTHROID, LEVOTHROID  Take 1 tablet (75 mcg total) by mouth daily before breakfast.     pantoprazole 40 MG tablet  Commonly known as:  PROTONIX  Take 40 mg by mouth 2 (two) times daily.         SignedBirdie Riddle Apr 22, 2015, 6:56 PM

## 2015-04-09 NOTE — Progress Notes (Signed)
ANTICOAGULATION CONSULT NOTE   Pharmacy Consult for Heparin Indication: LA thrombus  No Known Allergies  Patient Measurements: Height: 5\' 2"  (157.5 cm) Weight: 130 lb 1.1 oz (59 kg) IBW/kg (Calculated) : 50.1 Heparin Dosing Weight: actual weight  Vital Signs: Temp: 99.3 F (37.4 C) (07/30 0637) Temp Source: Axillary (07/30 0637) BP: 62/38 mmHg (07/30 1058) Pulse Rate: 62 (07/30 0637)  Labs:  Recent Labs  04/05/15 0441  04/06/15 0405 04/06/15 1100 Apr 19, 2015 0001 04-19-15 1110  HGB 13.0  --   --   --   --   --   HCT 37.0  --   --   --   --   --   PLT PLATELET CLUMPS NOTED ON SMEAR, UNABLE TO ESTIMATE  --   --   --   --   --   APTT  --   < >  --  >200* 139* 192*  HEPARINUNFRC  --   < >  --  >2.20* 2.02* 1.96*  CREATININE 2.12*  --  2.05*  --   --   --   < > = values in this interval not displayed. Estimated Creatinine Clearance: 16.7 mL/min (by C-G formula based on Cr of 2.05).  Medical History: Past Medical History  Diagnosis Date  . Diabetes mellitus   . Hypertension   . Clostridium difficile colitis 01/2013  . Hiatal hernia   . Hypothyroid   . GERD (gastroesophageal reflux disease)   . Coronary artery disease   . Diverticulitis   . Uterine fibroid   . Shingles   . Diastolic dysfunction     EF 35-40%  . Gout   . Chronic atrial fibrillation   . Renal insufficiency   . Left renal artery stenosis   . CKD (chronic kidney disease), stage II 02/2015   Medications:  Scheduled:  . antiseptic oral rinse  7 mL Mouth Rinse q12n4p  . atorvastatin  10 mg Oral QPM  . cefTAZidime (FORTAZ)  IV  2 g Intravenous Q24H  . chlorhexidine  15 mL Mouth Rinse BID  . cycloSPORINE  1 drop Both Eyes BID  . diltiazem  30 mg Oral Q12H  . donepezil  5 mg Oral Daily  . feeding supplement (ENSURE ENLIVE)  237 mL Oral BID BM  . levothyroxine  100 mcg Oral QAC breakfast  . pantoprazole  40 mg Oral Daily  . sodium chloride  3 mL Intravenous Q12H  . vancomycin  750 mg Intravenous Q48H    Infusions:  . sodium chloride 75 mL/hr at 04-19-15 0313  . dextrose 5 % and 0.45% NaCl 75 mL/hr at 19-Apr-2015 1101  . DOPamine 5 mcg/kg/min (04/06/15 2019)  . heparin 350 Units/hr (04-19-2015 0143)   Assessment: 30 yoF admitted on 7/26 with AMS, weakness, hypotension with suspected sepsis 2/2 HCAP.  She was recently admitted for ACS, acute on chronic CHF and chronic AFib started on low-dose Eliquis on 12/01/14 per cardiologist.  Echocardiogram on 7/28 shows large LA thrombus and pharmacy is consulted to transition from Eliquis to Heparin IV.  Today 04-19-15  0000 HL=2.02/aPtt=139- levels starting to correlate better, no problems per RN  1100: HL 1.96; aPTT 192; labs not drawn from infusion line  No bleeding issues  Goal of Therapy:  Heparin level 0.3-0.7 units/ml aPTT 66-102 seconds Monitor platelets by anticoagulation protocol: Yes   Plan:   Decrease Heparin drip to 250 units/hr.  Heparin level, aPTT 8 hours after decreasing  Daily heparin level and CBC  Continue to  monitor H&H and platelets   Reuel Boom, PharmD, BCPS Pager: [phone] 04-16-2015, 12:51 PM

## 2015-04-09 NOTE — Progress Notes (Signed)
Ref: KILPATRICK [name],[name] R, MD   Subjective:  Hypoglycemic. Awake post D 50 use. Low blood pressure on 7.5 mcg/Kg/min of dopamine drip without titration. T max 99.7 F  Objective:  Vital Signs in the last 24 hours: Temp:  [99.1 F (37.3 C)-99.7 F (37.6 C)] 99.3 F (37.4 C) (07/30 0637) Pulse Rate:  [62-94] 62 (07/30 0637) Cardiac Rhythm:  [-]  Resp:  [13-21] 14 (07/30 0637) BP: (62-113)/(33-74) 62/38 mmHg (07/30 1058) SpO2:  [72 %-96 %] 72 % (07/30 0637) Weight:  [59 kg (130 lb 1.1 oz)] 59 kg (130 lb 1.1 oz) (07/30 4098)  Physical Exam: BP Readings from Last 1 Encounters:  2015-04-30 62/38    Wt Readings from Last 1 Encounters:  30-Apr-2015 59 kg (130 lb 1.1 oz)    Weight change:   HEENT: [place]/AT, Eyes-Brown, PERL, EOMI, Conjunctiva-Pink, Sclera-Non-icteric Neck: + JVD, No bruit, Trachea midline. Lungs:  Clearing, Bilateral. Cardiac:  Regular rhythm, normal S1 and S2, no S3. II/VI systolic murmur. Abdomen:  Soft, non-tender. Extremities:  2 + edema present. No cyanosis. No clubbing. CNS: AxOx2, Cranial nerves grossly intact, moves all 4 extremities.  Skin: Warm and dry.   Intake/Output from previous day: 07/29 0701 - 07/30 0700 In: 1673.4 [I.V.:1673.4] Out: 85 [Urine:85]    Lab Results: BMET    Component Value Date/Time   NA 137 04/06/2015 0405   NA 135 04/05/2015 0441   NA 133* 04/04/2015 0816   K 3.5 04/06/2015 0405   K 3.8 04/05/2015 0441   K 5.0 04/04/2015 0816   CL 109 04/06/2015 0405   CL 105 04/05/2015 0441   CL 107 04/04/2015 0816   CO2 19* 04/06/2015 0405   CO2 18* 04/05/2015 0441   CO2 15* 04/04/2015 0816   GLUCOSE 88 04/06/2015 0405   GLUCOSE 109* 04/05/2015 0441   GLUCOSE 104* 04/04/2015 0816   BUN 25* 04/06/2015 0405   BUN 24* 04/05/2015 0441   BUN 25* 04/04/2015 0816   CREATININE 2.05* 04/06/2015 0405   CREATININE 2.12* 04/05/2015 0441   CREATININE 2.03* 04/04/2015 0816   CALCIUM 8.0* 04/06/2015 0405   CALCIUM 8.1* 04/05/2015 0441   CALCIUM  7.9* 04/04/2015 0816   GFRNONAA 21* 04/06/2015 0405   GFRNONAA 21* 04/05/2015 0441   GFRNONAA 22* 04/04/2015 0816   GFRAA 25* 04/06/2015 0405   GFRAA 24* 04/05/2015 0441   GFRAA 25* 04/04/2015 0816   CBC    Component Value Date/Time   WBC 4.2 04/05/2015 0441   RBC 4.26 04/05/2015 0441   HGB 13.0 04/05/2015 0441   HCT 37.0 04/05/2015 0441   PLT PLATELET CLUMPS NOTED ON SMEAR, UNABLE TO ESTIMATE 04/05/2015 0441   MCV 86.9 04/05/2015 0441   MCH 30.5 04/05/2015 0441   MCHC 35.1 04/05/2015 0441   RDW 19.4* 04/05/2015 0441   LYMPHSABS 0.4* 04/05/2015 0441   MONOABS 0.5 04/05/2015 0441   EOSABS 0.0 04/05/2015 0441   BASOSABS 0.0 04/05/2015 0441   HEPATIC Function Panel  Recent Labs  03/05/15 0630 03/16/2015 1859  PROT 6.1* 5.0*   HEMOGLOBIN A1C No components found for: HGA1C,  MPG CARDIAC ENZYMES Lab Results  Component Value Date   CKTOTAL 381* 01/20/2012   CKMB 9.1* 01/20/2012   TROPONINI 0.74* 04/04/2015   TROPONINI 0.62* 03/13/2015   TROPONINI 0.57* 03/15/2015   BNP No results for input(s): PROBNP in the last 8760 hours. TSH  Recent Labs  03/08/15 1213  TSH 66.617*   CHOLESTEROL  Recent Labs  03/06/15 0818  CHOL 278*  Scheduled Meds: . antiseptic oral rinse  7 mL Mouth Rinse q12n4p  . atorvastatin  10 mg Oral QPM  . cefTAZidime (FORTAZ)  IV  2 g Intravenous Q24H  . chlorhexidine  15 mL Mouth Rinse BID  . cycloSPORINE  1 drop Both Eyes BID  . diltiazem  30 mg Oral Q12H  . donepezil  5 mg Oral Daily  . feeding supplement (ENSURE ENLIVE)  237 mL Oral BID BM  . levothyroxine  100 mcg Oral QAC breakfast  . pantoprazole  40 mg Oral Daily  . sodium chloride  3 mL Intravenous Q12H  . vancomycin  750 mg Intravenous Q48H   Continuous Infusions: . sodium chloride 75 mL/hr at 29-Apr-2015 0313  . dextrose 5 % and 0.45% NaCl 75 mL/hr at April 29, 2015 1101  . DOPamine 5 mcg/kg/min (04/06/15 2019)  . heparin 350 Units/hr (04/29/15 0143)   PRN Meds:.acetaminophen  **OR** acetaminophen, allopurinol, morphine injection, ondansetron **OR** ondansetron (ZOFRAN) IV  Assessment/Plan: Acute on chronic Biventricular systolic heart failure Acute LA thrombus Acute digitalis toxicity Altered mental status Atrial fibrillation with controlled ventricular response, CHADS2VASC score of 5/9. DM, II Hypertension CAD, multivessel, native vessel CKD, II Hypothyroidism  Continue IV heparin.  Blood work in AM. Prognosis poor without support. Re-discuss with family for end of life goals     LOS: 3 days    [name] Dials  MD  04-29-2015, 11:12 AM

## 2015-04-09 NOTE — Progress Notes (Signed)
Hypoglycemic Event  CBG: 68  Treatment: D50 IV 25 mL  Symptoms: None  Follow-up CBG: [id number] CBG Result:79  Possible Reasons for Event: Inadequate meal intake  Comments/MD notified:    Joaquin Courts  Remember to initiate Hypoglycemia Order Set & complete

## 2015-04-09 NOTE — Progress Notes (Signed)
RN called into room by family. Patient with no heart beat or respirations.  Death pronounced at 45 with Clotilde Dieter, RN.

## 2015-04-09 NOTE — Progress Notes (Addendum)
Pt with low BP this am, recheck manual bp shows 71/48. MD notified. Also checked cbg per MD request. Pt is hypoglycemic at 68. Treated with d50 as pt not swallowing anything by mouth at this time.  Per MD, will continue current treatment with no adjustments to dopamine drip. Will add additional fluids to see if BP  Improves. Will continue to monitor. Also per MD, pt not candidate for transfer back to ICU/stepdown and plan is to manage symptoms on the floor at this time.

## 2015-04-09 NOTE — Progress Notes (Signed)
ANTICOAGULATION CONSULT NOTE   Pharmacy Consult for Heparin Indication: LA thrombus  No Known Allergies  Patient Measurements: Height: 5\' 2"  (157.5 cm) Weight: 130 lb 8.2 oz (59.2 kg) IBW/kg (Calculated) : 50.1 Heparin Dosing Weight: actual weight  Vital Signs: Temp: 99.1 F (37.3 C) (07/29 2000) BP: 90/63 mmHg (07/29 2100) Pulse Rate: 92 (07/29 2100)  Labs:  Recent Labs  04/04/15 0816 04/05/15 0441 04/05/15 1701 04/06/15 0405 04/06/15 1100 04-May-2015 0001  HGB  --  13.0  --   --   --   --   HCT  --  37.0  --   --   --   --   PLT  --  PLATELET CLUMPS NOTED ON SMEAR, UNABLE TO ESTIMATE  --   --   --   --   APTT  --   --  35  --  >200* 139*  HEPARINUNFRC  --   --  >2.20*  --  >2.20* 2.02*  CREATININE 2.03* 2.12*  --  2.05*  --   --   TROPONINI 0.74*  --   --   --   --   --    Estimated Creatinine Clearance: 16.7 mL/min (by C-G formula based on Cr of 2.05).  Medical History: Past Medical History  Diagnosis Date  . Diabetes mellitus   . Hypertension   . Clostridium difficile colitis 01/2013  . Hiatal hernia   . Hypothyroid   . GERD (gastroesophageal reflux disease)   . Coronary artery disease   . Diverticulitis   . Uterine fibroid   . Shingles   . Diastolic dysfunction     EF 35-40%  . Gout   . Chronic atrial fibrillation   . Renal insufficiency   . Left renal artery stenosis   . CKD (chronic kidney disease), stage II 02/2015   Medications:  Scheduled:  . antiseptic oral rinse  7 mL Mouth Rinse q12n4p  . atorvastatin  10 mg Oral QPM  . cefTAZidime (FORTAZ)  IV  2 g Intravenous Q24H  . chlorhexidine  15 mL Mouth Rinse BID  . cycloSPORINE  1 drop Both Eyes BID  . diltiazem  30 mg Oral Q12H  . donepezil  5 mg Oral Daily  . feeding supplement (ENSURE ENLIVE)  237 mL Oral BID BM  . levothyroxine  100 mcg Oral QAC breakfast  . pantoprazole  40 mg Oral Daily  . sodium chloride  3 mL Intravenous Q12H  . vancomycin  750 mg Intravenous Q48H   Infusions:  .  sodium chloride 75 mL/hr at 04/06/15 1800  . DOPamine 5 mcg/kg/min (04/06/15 2019)  . heparin     Assessment: 36 yoF admitted on 7/26 with AMS, weakness, hypotension with suspected sepsis 2/2 HCAP.  She was recently admitted for ACS, acute on chronic CHF and chronic AFib started on low-dose Eliquis on 12/01/14 per cardiologist.  Echocardiogram on 7/28 shows large LA thrombus and pharmacy is consulted to transition from Eliquis to Heparin IV.  Most recent Eliquis dosing at 2.5mg  BID - last dose administered on 04/04/15 1055.  Patient unable to take PO meds 7/27 PM and pt/family refused administration on 7/28 AM.  SCr is elevated and increased to 2.12, CrCl ~ 16 ml/min.  Poor renal function will increase systemic exposure to Eliquis and will prolong time to clearance.  Hgb 13, stable. Platelets clumped on CBC 7/28.  Platelets most recently 151 on 7/26, but prior to that they were low in 30-40's 02/2015. No s/s bleeding  reported.  Initial heparin orders delayed - phlebotomy was unable to get PTT/Heparin draws and MD was consulted to place central line.  CVC placed 7/28 at 2146.  Heparin infusion begun at 850 units/hr at 0146 this morning. Baseline Heparin level elevated at > 2.2 as expected with Eliquis administration, aPTT was 35 seconds (below therapeutic range) so Heparin was initiated with no bolus.  First Heparin level and aPTT both elevated at > 2.2 and > 200 sec respectively, above goal range  Today 04-21-2015  0000 HL=2.02/aPtt=139- levels starting to correlate better, no problems per RN  Goal of Therapy:  Heparin level 0.3-0.7 units/ml aPTT 66-102 seconds Monitor platelets by anticoagulation protocol: Yes   Plan:   Decrease Heparin drip to 350 units/hr  Heparin level, aPTT 8 hours after decreasing  Daily heparin level and CBC  Continue to monitor H&H and platelets   Dorrene German April 21, 2015, 1:33 AM

## 2015-04-09 NOTE — Progress Notes (Signed)
Pt's daughter, sisters and "adopted" daughter were bedside when I arrived. Pt's daughter wanted me to ask nurse for bp stats; passed mssge on to unit nurse. Ms. Blystone daughter was very tearful and her aunt and I offered spt during her moments of processing the thought of potentially losing her mom. Family was very grateful for visit and emotional support. Ernest Haber Chaplain   May 05, 2015 1400  Clinical Encounter Type  Visited With Family

## 2015-04-09 DEATH — deceased

## 2017-01-26 IMAGING — CR DG CHEST 1V PORT SAME DAY
1 series · 1 of 1 positions shown · non-contrast
Comparison: 04/03/2015

CLINICAL DATA: Central line placement.

EXAM:
PORTABLE CHEST - 1 VIEW SAME DAY

[AP]
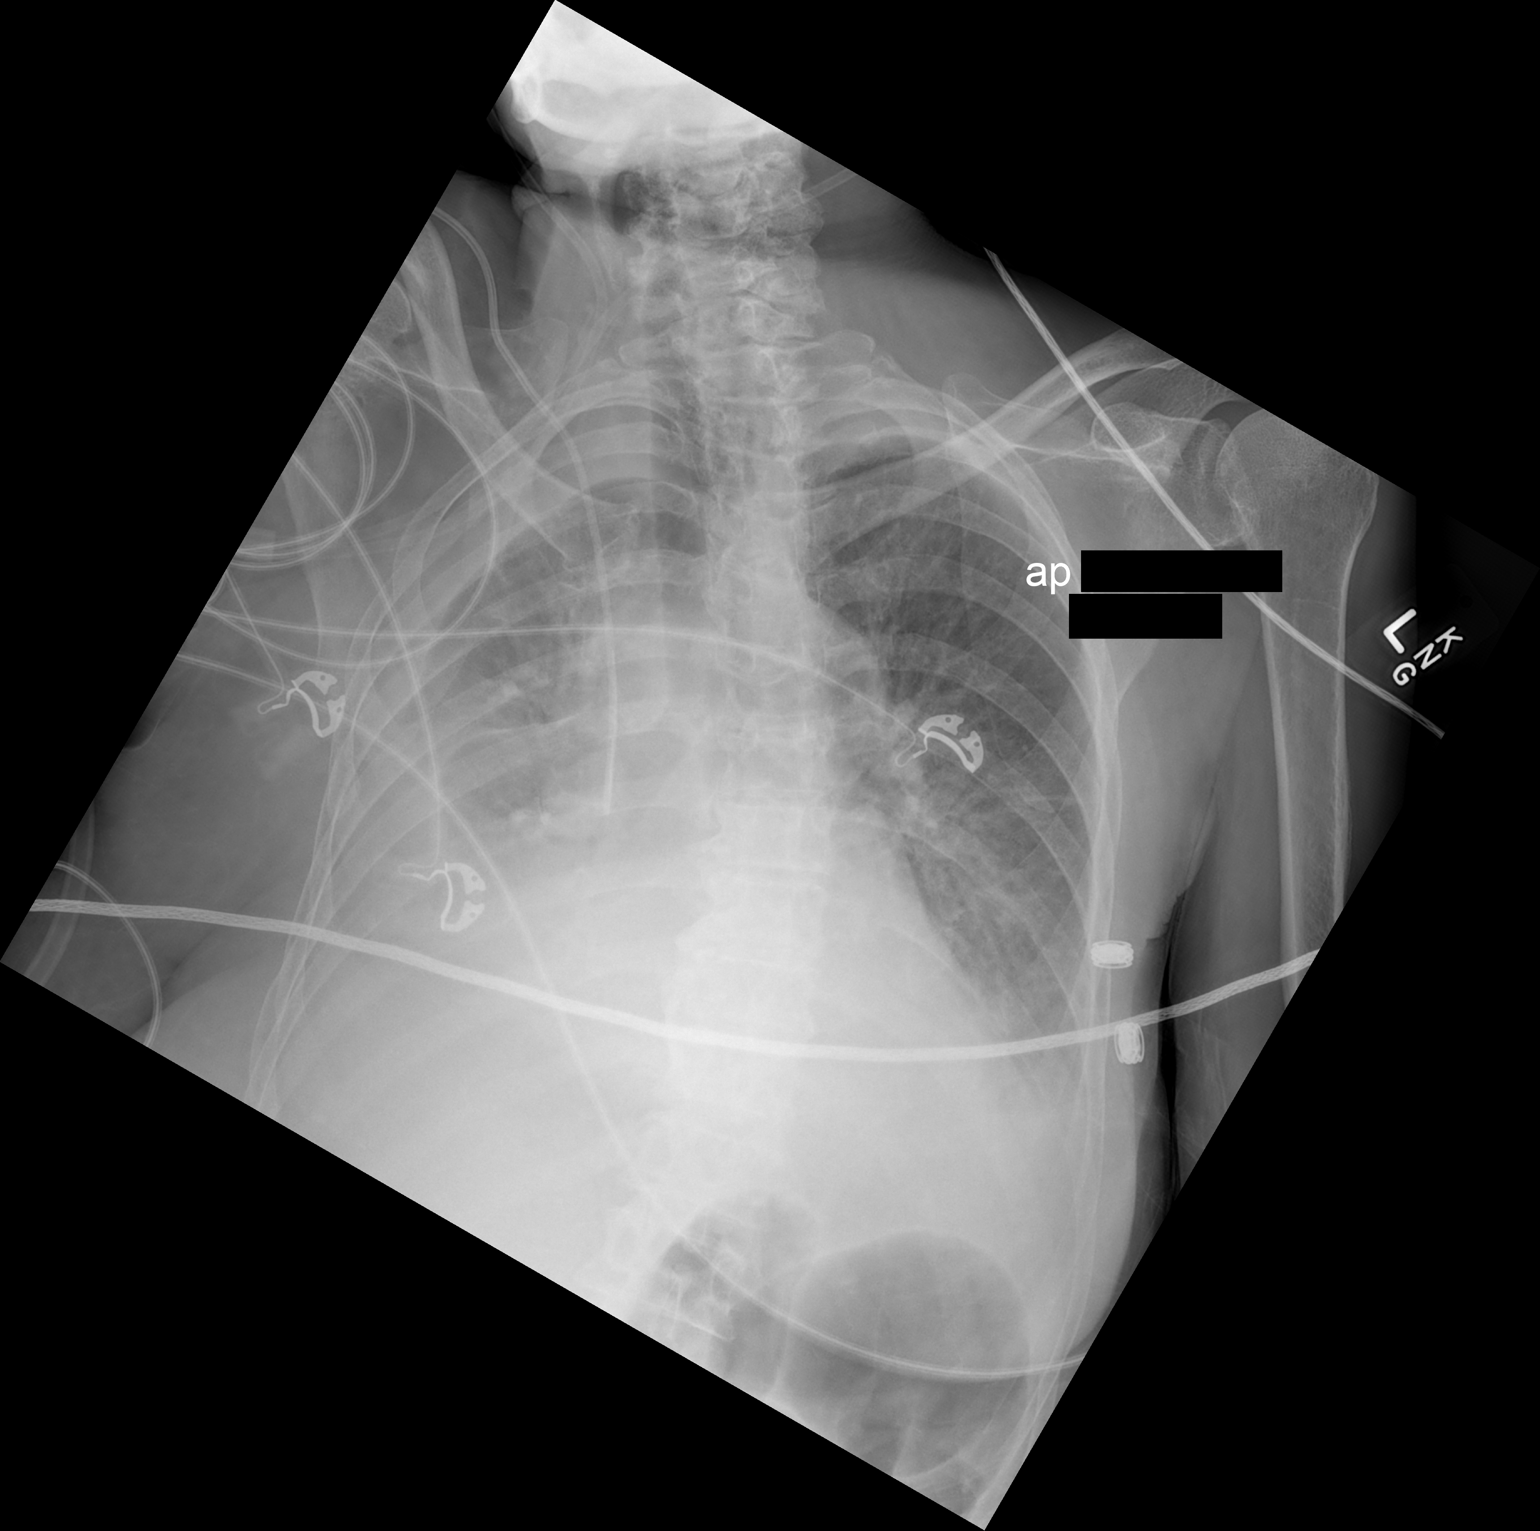

[1 of 1 positions shown; findings below may reference images not displayed]

FINDINGS: Tip of the right central line in the mid distal SVC. No
pneumothorax. Increased haziness throughout the right hemithorax
consistent with increasing right pleural effusion. Unchanged left
pleural effusion and hazy opacity. Bibasilar airspace disease again
seen, may reflect compressive atelectasis. Persistent but decreased
vascular congestion. Cardiomediastinal contours are obscured.
IMPRESSION: 1. Tip of the right central line in the mid distal SVC. No
pneumothorax.
2. Increased right pleural effusion. Unchanged left pleural
effusion. Associated bibasilar opacities may reflect compressive
atelectasis. Slight improvement in vascular congestion.

## 2019-04-09 DEATH — deceased
